# Patient Record
Sex: Female | Born: 1945 | Race: White | Hispanic: No | Smoking: Current some day smoker
Health system: Southern US, Community
[De-identification: ages and names within clinical notes are randomized; demographics above are authoritative.]

## PROBLEM LIST (undated history)

## (undated) DIAGNOSIS — F329 Major depressive disorder, single episode, unspecified: Secondary | ICD-10-CM

## (undated) DIAGNOSIS — Z8719 Personal history of other diseases of the digestive system: Secondary | ICD-10-CM

## (undated) DIAGNOSIS — Z87440 Personal history of urinary (tract) infections: Secondary | ICD-10-CM

## (undated) DIAGNOSIS — R42 Dizziness and giddiness: Secondary | ICD-10-CM

## (undated) DIAGNOSIS — R238 Other skin changes: Secondary | ICD-10-CM

## (undated) DIAGNOSIS — I639 Cerebral infarction, unspecified: Secondary | ICD-10-CM

## (undated) DIAGNOSIS — R233 Spontaneous ecchymoses: Secondary | ICD-10-CM

## (undated) DIAGNOSIS — T8859XA Other complications of anesthesia, initial encounter: Secondary | ICD-10-CM

## (undated) DIAGNOSIS — I499 Cardiac arrhythmia, unspecified: Secondary | ICD-10-CM

## (undated) DIAGNOSIS — I6529 Occlusion and stenosis of unspecified carotid artery: Secondary | ICD-10-CM

## (undated) DIAGNOSIS — I509 Heart failure, unspecified: Secondary | ICD-10-CM

## (undated) DIAGNOSIS — M1990 Unspecified osteoarthritis, unspecified site: Secondary | ICD-10-CM

## (undated) DIAGNOSIS — M21379 Foot drop, unspecified foot: Secondary | ICD-10-CM

## (undated) DIAGNOSIS — Z86718 Personal history of other venous thrombosis and embolism: Secondary | ICD-10-CM

## (undated) DIAGNOSIS — I739 Peripheral vascular disease, unspecified: Secondary | ICD-10-CM

## (undated) DIAGNOSIS — N289 Disorder of kidney and ureter, unspecified: Secondary | ICD-10-CM

## (undated) DIAGNOSIS — F32A Depression, unspecified: Secondary | ICD-10-CM

## (undated) DIAGNOSIS — I714 Abdominal aortic aneurysm, without rupture, unspecified: Secondary | ICD-10-CM

## (undated) DIAGNOSIS — H547 Unspecified visual loss: Secondary | ICD-10-CM

## (undated) DIAGNOSIS — J449 Chronic obstructive pulmonary disease, unspecified: Secondary | ICD-10-CM

## (undated) DIAGNOSIS — I2510 Atherosclerotic heart disease of native coronary artery without angina pectoris: Secondary | ICD-10-CM

## (undated) DIAGNOSIS — G458 Other transient cerebral ischemic attacks and related syndromes: Secondary | ICD-10-CM

## (undated) DIAGNOSIS — G8929 Other chronic pain: Secondary | ICD-10-CM

## (undated) DIAGNOSIS — K219 Gastro-esophageal reflux disease without esophagitis: Secondary | ICD-10-CM

## (undated) DIAGNOSIS — E785 Hyperlipidemia, unspecified: Secondary | ICD-10-CM

## (undated) DIAGNOSIS — T4145XA Adverse effect of unspecified anesthetic, initial encounter: Secondary | ICD-10-CM

## (undated) DIAGNOSIS — J189 Pneumonia, unspecified organism: Secondary | ICD-10-CM

## (undated) DIAGNOSIS — I10 Essential (primary) hypertension: Secondary | ICD-10-CM

## (undated) DIAGNOSIS — F419 Anxiety disorder, unspecified: Secondary | ICD-10-CM

## (undated) DIAGNOSIS — M549 Dorsalgia, unspecified: Secondary | ICD-10-CM

## (undated) DIAGNOSIS — Z5189 Encounter for other specified aftercare: Secondary | ICD-10-CM

## (undated) DIAGNOSIS — R0602 Shortness of breath: Secondary | ICD-10-CM

## (undated) DIAGNOSIS — C801 Malignant (primary) neoplasm, unspecified: Secondary | ICD-10-CM

## (undated) DIAGNOSIS — I679 Cerebrovascular disease, unspecified: Secondary | ICD-10-CM

## (undated) HISTORY — DX: Hyperlipidemia, unspecified: E78.5

## (undated) HISTORY — DX: Dizziness and giddiness: R42

## (undated) HISTORY — PX: HAND SURGERY: SHX662

## (undated) HISTORY — DX: Cerebrovascular disease, unspecified: I67.9

## (undated) HISTORY — PX: COSMETIC SURGERY: SHX468

## (undated) HISTORY — DX: Anxiety disorder, unspecified: F41.9

## (undated) HISTORY — DX: Abdominal aortic aneurysm, without rupture: I71.4

## (undated) HISTORY — PX: CARDIAC CATHETERIZATION: SHX172

## (undated) HISTORY — PX: FOOT SURGERY: SHX648

## (undated) HISTORY — DX: Unspecified osteoarthritis, unspecified site: M19.90

## (undated) HISTORY — DX: Abdominal aortic aneurysm, without rupture, unspecified: I71.40

## (undated) HISTORY — DX: Occlusion and stenosis of unspecified carotid artery: I65.29

## (undated) HISTORY — DX: Other transient cerebral ischemic attacks and related syndromes: G45.8

## (undated) HISTORY — PX: JOINT REPLACEMENT: SHX530

## (undated) HISTORY — DX: Essential (primary) hypertension: I10

## (undated) HISTORY — PX: OTHER SURGICAL HISTORY: SHX169

## (undated) HISTORY — DX: Major depressive disorder, single episode, unspecified: F32.9

## (undated) HISTORY — PX: ROTATOR CUFF REPAIR: SHX139

## (undated) HISTORY — PX: CARPAL TUNNEL RELEASE: SHX101

## (undated) HISTORY — PX: EYE SURGERY: SHX253

## (undated) HISTORY — PX: VAGINAL HYSTERECTOMY: SUR661

## (undated) HISTORY — PX: APPENDECTOMY: SHX54

## (undated) HISTORY — PX: NASAL SINUS SURGERY: SHX719

## (undated) HISTORY — DX: Depression, unspecified: F32.A

---

## 1998-04-10 ENCOUNTER — Emergency Department (HOSPITAL_COMMUNITY): Admission: EM | Admit: 1998-04-10 | Discharge: 1998-04-10 | Payer: Self-pay | Admitting: Internal Medicine

## 1998-05-04 ENCOUNTER — Emergency Department (HOSPITAL_COMMUNITY): Admission: EM | Admit: 1998-05-04 | Discharge: 1998-05-05 | Payer: Self-pay | Admitting: Emergency Medicine

## 1998-05-06 ENCOUNTER — Ambulatory Visit (HOSPITAL_COMMUNITY): Admission: RE | Admit: 1998-05-06 | Discharge: 1998-05-06 | Payer: Self-pay

## 1998-09-20 ENCOUNTER — Ambulatory Visit (HOSPITAL_COMMUNITY): Admission: RE | Admit: 1998-09-20 | Discharge: 1998-09-20 | Payer: Self-pay | Admitting: Cardiology

## 1999-04-07 ENCOUNTER — Encounter: Payer: Self-pay | Admitting: Orthopedic Surgery

## 1999-04-07 ENCOUNTER — Ambulatory Visit (HOSPITAL_COMMUNITY): Admission: RE | Admit: 1999-04-07 | Discharge: 1999-04-07 | Payer: Self-pay | Admitting: Orthopedic Surgery

## 1999-06-25 ENCOUNTER — Emergency Department (HOSPITAL_COMMUNITY): Admission: EM | Admit: 1999-06-25 | Discharge: 1999-06-25 | Payer: Self-pay | Admitting: Emergency Medicine

## 1999-06-27 ENCOUNTER — Emergency Department (HOSPITAL_COMMUNITY): Admission: EM | Admit: 1999-06-27 | Discharge: 1999-06-27 | Payer: Self-pay

## 1999-07-04 ENCOUNTER — Other Ambulatory Visit: Admission: RE | Admit: 1999-07-04 | Discharge: 1999-07-04 | Payer: Self-pay | Admitting: Orthopedic Surgery

## 1999-07-14 ENCOUNTER — Emergency Department (HOSPITAL_COMMUNITY): Admission: EM | Admit: 1999-07-14 | Discharge: 1999-07-14 | Payer: Self-pay | Admitting: Emergency Medicine

## 1999-07-18 ENCOUNTER — Inpatient Hospital Stay (HOSPITAL_COMMUNITY): Admission: AD | Admit: 1999-07-18 | Discharge: 1999-07-25 | Payer: Self-pay | Admitting: Orthopedic Surgery

## 2000-06-22 ENCOUNTER — Emergency Department (HOSPITAL_COMMUNITY): Admission: EM | Admit: 2000-06-22 | Discharge: 2000-06-22 | Payer: Self-pay | Admitting: Emergency Medicine

## 2000-12-03 ENCOUNTER — Emergency Department (HOSPITAL_COMMUNITY): Admission: EM | Admit: 2000-12-03 | Discharge: 2000-12-03 | Payer: Self-pay | Admitting: Emergency Medicine

## 2001-03-21 ENCOUNTER — Encounter: Payer: Self-pay | Admitting: Orthopedic Surgery

## 2001-03-25 ENCOUNTER — Inpatient Hospital Stay (HOSPITAL_COMMUNITY): Admission: RE | Admit: 2001-03-25 | Discharge: 2001-03-28 | Payer: Self-pay | Admitting: Orthopedic Surgery

## 2001-06-20 ENCOUNTER — Encounter: Payer: Self-pay | Admitting: Emergency Medicine

## 2001-06-20 ENCOUNTER — Emergency Department (HOSPITAL_COMMUNITY): Admission: EM | Admit: 2001-06-20 | Discharge: 2001-06-20 | Payer: Self-pay | Admitting: Emergency Medicine

## 2001-06-21 ENCOUNTER — Ambulatory Visit (HOSPITAL_COMMUNITY): Admission: RE | Admit: 2001-06-21 | Discharge: 2001-06-21 | Payer: Self-pay | Admitting: Orthopedic Surgery

## 2001-06-21 ENCOUNTER — Encounter: Payer: Self-pay | Admitting: Orthopedic Surgery

## 2001-08-02 ENCOUNTER — Emergency Department (HOSPITAL_COMMUNITY): Admission: EM | Admit: 2001-08-02 | Discharge: 2001-08-02 | Payer: Self-pay | Admitting: Emergency Medicine

## 2001-08-06 ENCOUNTER — Ambulatory Visit (HOSPITAL_COMMUNITY): Admission: RE | Admit: 2001-08-06 | Discharge: 2001-08-06 | Payer: Self-pay | Admitting: Family Medicine

## 2001-08-06 ENCOUNTER — Encounter: Payer: Self-pay | Admitting: Family Medicine

## 2002-04-08 ENCOUNTER — Encounter: Payer: Self-pay | Admitting: Emergency Medicine

## 2002-04-08 ENCOUNTER — Emergency Department (HOSPITAL_COMMUNITY): Admission: EM | Admit: 2002-04-08 | Discharge: 2002-04-09 | Payer: Self-pay | Admitting: Emergency Medicine

## 2002-04-09 ENCOUNTER — Ambulatory Visit (HOSPITAL_COMMUNITY): Admission: RE | Admit: 2002-04-09 | Discharge: 2002-04-09 | Payer: Self-pay | Admitting: Emergency Medicine

## 2003-04-13 ENCOUNTER — Ambulatory Visit (HOSPITAL_COMMUNITY): Admission: RE | Admit: 2003-04-13 | Discharge: 2003-04-13 | Payer: Self-pay | Admitting: Cardiology

## 2003-08-24 ENCOUNTER — Ambulatory Visit (HOSPITAL_BASED_OUTPATIENT_CLINIC_OR_DEPARTMENT_OTHER): Admission: RE | Admit: 2003-08-24 | Discharge: 2003-08-24 | Payer: Self-pay | Admitting: Orthopedic Surgery

## 2003-08-24 ENCOUNTER — Ambulatory Visit (HOSPITAL_COMMUNITY): Admission: RE | Admit: 2003-08-24 | Discharge: 2003-08-24 | Payer: Self-pay | Admitting: Orthopedic Surgery

## 2004-01-27 ENCOUNTER — Ambulatory Visit (HOSPITAL_COMMUNITY): Admission: RE | Admit: 2004-01-27 | Discharge: 2004-01-27 | Payer: Self-pay | Admitting: Orthopedic Surgery

## 2004-01-27 ENCOUNTER — Ambulatory Visit (HOSPITAL_BASED_OUTPATIENT_CLINIC_OR_DEPARTMENT_OTHER): Admission: RE | Admit: 2004-01-27 | Discharge: 2004-01-27 | Payer: Self-pay | Admitting: Orthopedic Surgery

## 2004-07-04 ENCOUNTER — Ambulatory Visit (HOSPITAL_COMMUNITY): Admission: RE | Admit: 2004-07-04 | Discharge: 2004-07-04 | Payer: Self-pay | Admitting: Family Medicine

## 2004-09-21 ENCOUNTER — Emergency Department (HOSPITAL_COMMUNITY): Admission: EM | Admit: 2004-09-21 | Discharge: 2004-09-22 | Payer: Self-pay | Admitting: Emergency Medicine

## 2004-09-23 ENCOUNTER — Inpatient Hospital Stay (HOSPITAL_COMMUNITY): Admission: EM | Admit: 2004-09-23 | Discharge: 2004-10-02 | Payer: Self-pay

## 2004-11-05 ENCOUNTER — Emergency Department (HOSPITAL_COMMUNITY): Admission: EM | Admit: 2004-11-05 | Discharge: 2004-11-05 | Payer: Self-pay | Admitting: Emergency Medicine

## 2007-05-02 ENCOUNTER — Ambulatory Visit (HOSPITAL_COMMUNITY): Admission: RE | Admit: 2007-05-02 | Discharge: 2007-05-02 | Payer: Self-pay | Admitting: Family Medicine

## 2011-02-16 NOTE — Consult Note (Signed)
NAMEREMEDY, CORPORAN NO.:  [id number]   MEDICAL RECORD NO.:  [id number]          PATIENT TYPE:  INP   LOCATION:  0470                         FACILITY:  [facility]   PHYSICIAN:  Angelia Mould. Derrell Lolling, M.D.DATE OF BIRTH:  [birth year]   DATE OF CONSULTATION:  09/24/2004  DATE OF DISCHARGE:                                   CONSULTATION   CHIEF COMPLAINT:  Pain, redness, and swelling of the perineum.   HISTORY OF PRESENT ILLNESS:  This is a 65 year old white female who reports  a 2-week history of progressive redness, pain, and swelling in the right  groin, right perineal area, and suprapubic area.  This has never happened to  her before.  She is not diabetic.  She was admitted to the [facility]  medical health team 48 hours ago and was noted to have severe cellulitis.  She was started on intravenous Cipro and clindamycin.  She has really not  gotten any better.  She states that she is having normal bowel movements and  not having any trouble having bowel movements.  She states that she is  voiding well but has noticed a little bit of blood in her urine.   A CT scan shows soft tissue swelling and cellulitis in the suprapubic soft  tissue area and the perineal area but no well-defined abscess has been seen.   PAST HISTORY:  1.  Multiple surgeries on lower extremities bilaterally for arthritis.  2.  She has had rotator cuff surgery.  3.  Stable abdominal aortic aneurysm followed by Dr. Aggie Cosier.  4.  Hypertension.  5.  Hyperlipidemia.   CURRENT MEDICATIONS:  1.  Paxil.  2.  Lovastatin.  3.  Hydrochlorothiazide.  4.  Talwin.  5.  Cipro.  6.  Clindamycin.   DRUG ALLERGIES:  PENICILLIN.   FAMILY HISTORY:  Mother had diabetes and coronary artery disease.  Father  had leukemia.  Both parents deceased.  Brother died of pancreatic cancer.  Sister died of a rare lung disease.   SOCIAL HISTORY:  The patient lives with her husband.  She smokes a half a  pack of  cigarettes per day.  She denies alcohol or drug use.   REVIEW OF SYSTEMS:  All systems are reviewed.  They are noncontributory  except as described above.   PHYSICAL EXAMINATION:  GENERAL:  Pleasant older white female who appears in  moderate distress.  VITAL SIGNS:  Temperature is 99.0, blood pressure 138/74, heart rate 79,  respiratory rate 20.  HEENT:  Eyes:  Sclerae clear, extraocular movements intact.  Ears, mouth and  throat, nose, lips, tongue and oropharynx without gross lesions.  NECK:  Supple, nontender, no mass, no jugular venous distention.  LUNGS:  Clear to auscultation.  No chest wall tenderness, no wheeze.  HEART:  Regular rate and rhythm.  No murmurs, rubs, or gallops.  ABDOMEN:  Soft, nontender, active bowel sounds, no mass, no hernia.  GENITOURINARY:  She has significant erythema and induration extending all  the way across the suprapubic area down both labia majora.  The right labia  majora  is much more indurated than the left.  There is no drainage or skin  necrosis but it is very tender.  In the right perineum anterior to the  rectum, there is foul-smelling draining sinus.  No real fluctuant area.  The  perianal skin actually looks all right.  The labia minora and the  periurethral area look fine.  EXTREMITIES:  She moves all four extremities well without pain or deformity.  NEUROLOGIC:  No gross motor or sensory deficits.   ADMISSION DATA:  A CT scan was performed 48 hours ago which shows cellulitis  of the anterior thigh and pelvis region, right greater than left.  Hemoglobin of 14.8; white blood cell count 25,000.   IMPRESSION:  1.  Severe soft tissue infection of the perineum including the right labia      majora and the suprapubic area.  I am concerned that this may be a      necrotizing synergistic infection.  2.  Hypertension.  3.  Tobacco abuse.   PLAN:  I advised the patient and she agreed that she should be taken to the  operating room urgently for  exploration of her wounds, proctoscopy, and  drainage and possible debridement.   I have discussed the indications and details of surgery with her.  Risks and  complications have been outlined, including but not limited to:  Bleeding;  infection; reoperation for uncontrolled infection; extensive tissue loss  requiring complex reconstruction and delayed healing; cardiac, pulmonary,  and thromboembolic problems.  She understands these issues well.  She agrees  with the plan.     Hayw   HMI/MEDQ  D:  09/24/2004  T:  09/25/2004  Job:  [id number]   cc:   Mallory Shirk, MD   Windle Guard, M.D.  [address]  [place], [place] [address]  Fax: [phone]

## 2011-02-16 NOTE — Op Note (Signed)
NAME:  Jocelyn Curtis, Jocelyn Curtis                         ACCOUNT NO.:  [id number]   MEDICAL RECORD NO.:  [id number]                   PATIENT TYPE:  AMB   LOCATION:  DSC                                  FACILITY:  [facility]   PHYSICIAN:  Rodney A. Chaney Malling, M.D.           DATE OF BIRTH:  1946-02-21   DATE OF PROCEDURE:  01/27/2004  DATE OF DISCHARGE:                                 OPERATIVE REPORT   PREOPERATIVE DIAGNOSES:  Massive retracted rotator cuff tear, left shoulder.  Osteoarthritis, left acromioclavicular (AC) joint.   POSTOPERATIVE DIAGNOSES:  Massive retracted rotator cuff tear, left  shoulder.  Osteoarthritis, left AC joint.   OPERATION:  Diagnostic arthroscopy, left shoulder; reconstruction of very  complex retracted rotator cuff tear, left shoulder; resection of left distal  clavicle.   SURGEON:  Lenard Galloway. Chaney Malling, M.D.   ANESTHESIA:  General.   PROCEDURE:  The patient was placed on the operative table in the supine  position.  After satisfactory oral endotracheal anesthesia, the patient was  placed in a semi-seated position.  The left shoulder and upper extremities  were prepped with Duraprep and draped out in the usual manner.  Through a  posterior portal, the arthroscope was introduced and very careful  examination was undertaken.  The articular cartilage of the humeral head and  the glenoid was absolutely normal as was the labrum.  There was a massive  rotator cuff tear.  There was partial tearing of the biceps tendon.  From  the intra-articular portion of the shoulder one could look up and see the  acromion.  Rotator cuff was completely and totally avulsed and retracted  proximally.  A saber cut incision was made over the anterolateral aspect of  the left shoulder.  The skin edges were retracted.  The deltoid fibers were  released off the anterior aspect of the acromion only.  Subacromial space  was opened.  A Neer anterior 1/3 acromioplasty was then done.  This gave  good access to the shoulder joint itself.  The underlying bursa was excised.  The patient had a massive rotator cuff tear which was retracted.  A great  deal of time was spent mobilizing a retracted tear and this could be brought  down to an almost anatomic position.  Using a series of 5-0 wire mattress  sutures, the rotator cuff was brought down almost to anatomic position and  closed side to side.  An almost completely water tight closure was achieved.  The repair was much better than I anticipated with extent of this tear.  This was fairly complicated with an extensive tear.  Throughout the  procedure, the shoulder was irrigated with copious amounts of antibiotic  solution.  Attention was then turned to the distal clavicle.  The capsule  was opened dorsally.  There were significant degenerative changes seen at  this level.  Power saw was then used to resect the end of the clavicle.  The  capsule was then closed using 2-0 fiber wire so that stability about distal  clavicle was achieved.  Again, the shoulder was irrigated with copious  amounts of antibiotic solution.  The deltoid fiber was reattached with 0  Vicryl.  Then 2-0 Vicryl was used to close the subcutaneous tissue and  stainless steel staples were used to close the skin.  Sterile dressings were  applied.  The patient was returned to the recovery room in excellent  condition.  Again, this was a fairly complicated, extensive procedure.                                               Rodney A. Chaney Malling, M.D.    RAM/MEDQ  D:  01/27/2004  T:  01/27/2004  Job:  [id number]

## 2011-02-16 NOTE — Discharge Summary (Signed)
[place]  Patient:    Jocelyn Curtis, Jocelyn Curtis Visit Number: [id number] MRN: [id number]          Service Type: EMS Location: [facility] Attending Physician:  Doug Sou Dictated by:   Jamelle Rushing, P.A. Admit Date:  06/20/2001 Discharge Date: 06/20/2001                             Discharge Summary  ADMISSION DIAGNOSES: 1. End-stage osteoarthritis bilateral knees, left greater than right. 2. Osteoarthritis bilateral shoulders and hands. 3. Anxiety disorder. 4. Hypercholesterolemia. 5. History of infected left knee.  DISCHARGE DIAGNOSES: 1. Left total knee arthroplasty. 2. Anxiety disorder. 3. Hypercholesterolemia. 4. Obesity. 5. Tobacco use. 6. Constipation.  HISTORY OF PRESENT ILLNESS:  The patient is a 65 year old female who presented to Dr. Chaney Malling with a history of left knee arthroscopy in July 2000 and a second one in October 2000 with an excision of popliteal cysts.  Her left knee became infected in October 2000.  She required repeated irrigation and debridement via arthroscopy to rid of infection.  The patient did very well since that time and has no return of symptoms of infection.  The pain in her knee has progressively worsened.  The pain is now described as a constant, grinding-type pain located along the joint line with radiation up into the hip. The pain increases with weightbearing and ambulation and decreases with rest.  The patient is currently taking Talwin to decrease her symptoms.  The knee does presently give way.  It does swell.  It does keep her up at night. She has no locking, catching, or popping.  She does use a cane for ambulation.   ALLERGIES: 1. Keflex. 2. Penicillin. 3. NSAIDs causing GI intolerance.  CURRENT MEDICATIONS: 1. Talwin NX two tablets p.o. q.4h. 2. Paxil 40 mg p.o. q.d. 3. Lipitor 20 mg p.o. q.d.  PROCEDURE:  On March 25, 2001, the patient was taken to the OR by Thereasa Distance A. Chaney Malling, M.D.  assisted by Arnoldo Morale, P.A.-C.  Under general anesthesia, the patient had a left total knee replacement performed using fully cemented components, a large size left femoral component, a size 4 cemented tibial keel tray, a three-peg cemented patella with a 10 mm large size poly insert.  The patient tolerated the procedure well.  One Hemovac drain was left in place. There were no complications.  The patient was transferred to the recovery room in excellent condition.   CONSULTATIONS:  The patient had the following routine consults:  Physical therapy, occupational therapy, rehab, and care management.  HOSPITAL COURSE:  On March 25, 2001, the patient was admitted to [facility] under the care of [facility] A. Chaney Malling, M.D.  The patient was taken to the OR where a left total knee arthroplasty was performed.  The patient tolerated this procedure well and was transferred to the recovery room and to the orthopedic floor for routine postoperative rehab.  The patient was started on _______ 2.5 mg subcutaneous for a total of seven days for DVT prophylaxis.  The patient then incurred a three-day postoperative course in which she did very well with physical therapy, and the only untoward even that she had was some constipation on postoperative day #2 and #3 which was relieved with an enema.  The patients vital signs remained stable.  Her chemistries and labs remained stable.  Her left leg wound remained benign for any signs of infection.  The  leg remained neural, motor, and vascularly intact.  The patient progressed to the point where she was discharged to home in good condition on postoperative day #3.  DIAGNOSTIC STUDIES:  EKG on admission was normal sinus rhythm at 74 beats per minute.  CBC on March 27, 2001, WBC 6.2, hemoglobin 9.9, hematocrit 28.4, platelets 161. Routine chemistries on March 27, 2001, sodium 136, potassium 4.0, glucose 149, BUN 9, creatinine 0.8.  Routine urinalysis on  admission was normal with the exception of moderate hemoglobin.  DISCHARGE MEDICATIONS:  1. Colace 100 mg p.o. b.i.d.  2. Senokot one tablet p.o. b.i.d.  3. Zocor 40 mg p.o. q.d.  4. Paxil 40 mg p.o. q.h.s.  5. _______ 2.5 mg subcutaneous q.d.  6. Reglan 10 mg p.o. q.6h. p.r.n.  7. LOC/EOC p.r.n.  8. Tylenol 650 mg p.o. q.4h. p.r.n.  9. Restoril 15 mg p.o. q.h.s. p.r.n. 10. OxyContin CR 10 mg p.o. q.12h. 11. Percocet 1-2 tablets q.4-6h. p.r.n. pain.  DISCHARGE INSTRUCTIONS:  The patient is to resume preoperative medications and diet.  OxyContin CR __ mg p.o. q.12h. _______ 2.5 mg subcutaneous q.d. times four more days.  DISCHARGE ACTIVITY:  Partial weightbearing 50% or less on left leg with the use of a walker.  SPECIAL INSTRUCTIONS:  Home health physical therapy with Genevieve Norlander.  WOUND CARE:  The patient is to keep wound clean and dry.  Check daily for any signs of infection.  FOLLOW-UP APPOINTMENT:  The patient is to call for a follow-up appointment with Dr. Chaney Malling on April 07, 2001.  CONDITION ON DISCHARGE:  Listed as good. Dictated by:   Jamelle Rushing, P.A. Attending Physician:  Doug Sou DD:  06/26/01 TD:  06/26/01 Job: [id number]

## 2011-02-16 NOTE — H&P (Signed)
Jocelyn, Curtis NO.:  [id number]   MEDICAL RECORD NO.:  [id number]          PATIENT TYPE:  INP   LOCATION:  0465                         FACILITY:  [facility]   PHYSICIAN:  Mallory Shirk, MD     DATE OF BIRTH:  1945-10-13   DATE OF ADMISSION:  09/22/2004  DATE OF DISCHARGE:                                HISTORY & PHYSICAL   PRIMARY CARE PHYSICIAN:  Jocelyn Curtis, M.D.   CHIEF COMPLAINT:  Pain, swelling, erythema in the suprapubic, perineal, and  the right labile area.   HISTORY OF PRESENT ILLNESS:  Jocelyn Curtis is a pleasant 65 year old  Caucasian woman who came to the emergency department yesterday with  complaint of pain, erythema in the suprapubic, perineal, and right labile  area.  She was seen and discharged with Clindamycin p.o. and Lortab for  pain; however, the patient's condition did not get better so she came back  to the ED about 18 hours after discharge.  The patient states that the  symptoms started about two weeks ago with no apparent precipitating event.  Swelling became progressively worse and the pain became unbearable.  The  patient denies any other symptoms other than a headache.  Since the swelling  of the labile area, the patient has had discomfort while urinating; however,  she did not have any dysuria prior to a day ago.   The patient used to see a primary care physician named Jocelyn Curtis.  She does not see him anymore.  She has also been seen by Jocelyn Curtis,  whom she does not see any more.  At the present time, the patient does not  have a primary care physician.   PAST MEDICAL HISTORY:  1.  Multiple surgeries on the lower extremities bilaterally for arthritis.  2.  Stable abdominal aortic aneurysm followed by Jocelyn Curtis. Jocelyn Curtis,      cardiology.  3.  Hyperlipidemia.   MEDICATIONS:  1.  Paxil.  2.  Lovastatin.  3.  HCTZ.  4.  Talwin.   ALLERGIES:  PENICILLIN.   FAMILY HISTORY:  Significant for diabetes and  coronary artery disease in  mother.  Leukemia in father, both deceased.  Brother died of pancreatic  cancer and sister died of a rare lung disease.   SOCIAL HISTORY:  The patient lives with the husband.  One-half pack per day  cigarette smoking.  No alcohol or drug use.   REVIEW OF SYMPTOMS:  The patient has had a headache.  Denies any changes in  vision.  No dysphagia, chest pain, shortness of breath, abdominal pain.  No  perineal pain as described in the HPI.  No extremity pain at the present  time.  No dizziness or syncope.   PHYSICAL EXAMINATION:  VITAL SIGNS:  Blood pressure 132/96, pulse 113,  respiration 18, temperature 97.4.  GENERAL:  A very pleasant, middle-aged Caucasian woman lying in bed in no  acute distress.  HEENT:  Normocephalic and atraumatic.  PERRL.  Sclerae anicteric.  Oropharynx not erythematous.  NECK:  Supple.  No LAD.  No JVD.  LUNGS:  Clear to auscultation bilaterally.  No wheezes, no rales.  CARDIOVASCULAR:  S1 and S2 tachycardic.  No murmurs, gallops, or rubs.  ABDOMEN:  Soft.  Positive bowel sounds.  No tenderness and no masses.  GENITOURINARY:  Erythema, warm in the suprapubic, perineal area, right  greater than left.  Right labia is swollen significantly more than left.  Swelling in the right upper thigh.  EXTREMITIES:  No clubbing, cyanosis, or edema.  No tenderness.  NEUROLOGICAL:  Cranial nerves II through XII grossly intact.  Sensory and  motor within normal limits.  Motion of the right lower extremity limited by  pain in the perineal area.  Otherwise neurologic exam nonfocal.   LABORATORY DATA:  CT of the abdomen showed cellulitis of the anterior thigh  and pelvis region, right greater than left.  Extension into the right upper  thigh shows a 3 cm by 1 cm right perineal structure, question of abscess  seen.   WBC is 19.5, hemoglobin is 14.8, hematocrit 42.5, platelets 177,000.  Sodium  139, potassium 3.0, chloride 101, carbon dioxide 29, glucose  130, BUN 16,  creatinine 1.4.  Calcium 9.4, total protein 6.3, albumin 3.8, AST 19, ALT  12, alkaline phosphate 83, total bilirubin 0.6.   ASSESSMENT:  A 65 year old Caucasian woman with cellulitis in the  suprapubic, perineal, and right labile areas x 2 weeks.   PLAN:  1.  Cellulitis:  The patient is started on Unasyn 3 gm IV q.6h.  IV fluids      at 100 mL per hour.  Pain management with IV morphine.  Blood cultures x      2 have been drawn.  2.  Hypokalemia:  The patient will be supplemented with p.o. potassium and      also IV fluids will be normal saline with 20 of potassium.  We will      continue to monitor her electrolytes.  3.  Leukocytosis:  The patient's white count is 19.5.  We will continue to      monitor her white count.  4.  Her home medications were resumed.  She is on Lovenox 40 mg      subcutaneously q.d. for deep vein thrombosis prophylaxis and Protonix 40      mg p.o. q.d. for GI prophylaxis.  5.  Disposition:  After resolution of acute symptoms, the patient will be      discharged with p.o. antibiotics as needed.       GDK/MEDQ  D:  09/23/2004  T:  09/23/2004  Job:  [id number]   cc:   Jocelyn Curtis, M.D.  [address]  [place] [address]  Fax: [phone]

## 2011-02-16 NOTE — H&P (Signed)
NAMEOTTO, CARAWAY               ACCOUNT NO.:  [id number]   MEDICAL RECORD NO.:  [id number]          PATIENT TYPE:  INP   LOCATION:  0465                         FACILITY:  [facility]   PHYSICIAN:  Mallory Shirk, MD     DATE OF BIRTH:  October 09, 1945   DATE OF ADMISSION:  09/22/2004  DATE OF DISCHARGE:                                HISTORY & PHYSICAL   ADDENDUM  Please note the patient's antibiotics have been changed from Unasyn to  cefazolin 1 g IV q.8h. and Flagyl 500 mg IV q.8h. since the patient is  allergic to PENICILLIN.      GDK/MEDQ  D:  09/23/2004  T:  09/23/2004  Job:  [id number]

## 2011-02-16 NOTE — H&P (Signed)
[place]. [facility]  Patient:    Jocelyn Curtis, Jocelyn Curtis                      MRN: [id number] Adm. Date:  [id number] Disc. Date: [id number] Attending:  Lorre Nick Dictator:   Arnoldo Morale, P.A.                         History and Physical  DATE OF BIRTH: [birth year]  CHIEF COMPLAINT: Left knee pain for the last five years.  HISTORY OF PRESENT ILLNESS: This 65 year old white female patient presented to Dr. Chaney Malling with a history of repeated left knee arthroscopy.  She had left knee arthroscopy in July 2000 and then subsequently had to undergo another one in October 2000, with open excision of a popliteal cyst.  Her left knee became infected and on July 17, 1999 and then again on July 21, 1999 she required repeat irrigation and debridement via arthroscopy to help get rid of the infection.  She did well and had no signs of infection since that time, but the pain in her left knee has gotten progressively worse.  The pain was initially gradual in onset, with no known injury.  The pain is now described as a constant grinding-type pain located along the joint line of the knee, with radiation all the way up to her hip at times.  The pain increases with any walking and decreases with rest.  She is currently taking Talwin to help decrease the pain and that provides a moderate amount of relief.  The knee does currently give way, swell, and does keep her up at night.  There is no locking, catching, or popping noted.  She has required the use of a cane for ambulation since October 2000.  ALLERGIES:  1. KEFLEX and PENICILLIN cause itching, tremors, and erythema.  2. NSAIDs cause GI intolerance.  CURRENT MEDICATIONS:  1. Talwin NX two tablets p.o. q.4h for pain.  2. Paxil 40 mg one tablet p.o. q.p.m.  3. Lipitor 20 mg one tablet p.o. q.d.  PAST MEDICAL HISTORY:  1. She was diagnosed with anxiety disorder approximately two to three years     ago.  2. 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  3. She does have hypercholesterolemia.  She denies any history of hypertension, diabetes mellitus, thyroid disease, coronary artery disease, peptic ulcer disease, asthma, bronchitis, reflux, or any other chronic medical condition other than noted previously.  PAST SURGICAL HISTORY:  1. Left knee arthroscopy in July 2000.  2. Left knee arthroscopy with excision of popliteal cyst in October 2000.  3. Left knee arthroscopy secondary to infection on July 17, 1999.  4. Left knee arthroscopy due to infection on July 21, 1999.  5. Vaginal hysterectomy 25 years ago.  6. Appendectomy at age 70.  7. Nasal surgery done 15-20 years ago.  8. Swanson prosthesis done in right index and long finger at the MCP joint     by Dr. Chaney Malling on August 23, 1998.  9. Decompression of left ulnar nerve at the elbow and median nerve at the     wrist on the left by Dr. Rinaldo Ratel on September 03, 2000. 10. Right open rotator cuff repair with distal clavicle resection and     acromioplasty by Dr. Rinaldo Ratel on October 08, 2000.  SOCIAL HISTORY: The patient has an 65  pack-year history of cigarette smoking and she does currently smoke about half a packs of cigarettes a day.  She does not use any alcohol or drugs.  She is on medical retirement.  She lives with her husband in a one-story house with three steps into the main entrance.  Her medical doctor is Dr. Windle Guard (phone # [phone]).  FAMILY HISTORY: Her mother died, with history of diabetes mellitus.  Her father died, with a history of leukemia.  She has one sister, who died at age 84 with myocardial infarction.  REVIEW OF SYSTEMS: She has a history of left neck claudication, which has been treated in the past by Dr. Elsie Lincoln.  She complains of frequent constipation. She has osteoarthritis in multiple joints and especially the metacarpophalangeal joints  bilaterally.  She has a history of a TIA approximately ten years ago.  She does wear glasses and partial dentures on the upper and lower jaw line.  She has a history of fever blisters and a raw tongue when she takes aspirin.  She has a history of irregular heart beat, possibly PVCs that did not require treatment.  She last got a cortisone injection in her left metacarpophalangeal joint at the index finger, I believe, on March 17, 2001.  She does have difficulty sleeping secondary to shoulder pain of both shoulders, but the right is worse than the left.  All other systems are negative and noncontributory at this time.  PHYSICAL EXAMINATION:  GENERAL: Well-developed, well-nourished white female, who walks with a limp on the left.  Mood and affect are appropriate.  She talks easily with the examiner.  VITAL SIGNS: Height 5 feet 9 inches.  Weight 235 pounds.  BMI 34.  TEMP 98.6 degrees Fahrenheit, pulse 88, respirations 18, BP 128/80.  HEENT: Normocephalic, atraumatic, without frontal or maxillary sinus tenderness to palpation.  Conjunctivae pink.  Sclerae anicteric.  PERRLA. EOMI.  No visible external ear deformities, with hearing grossly intact. Tympanic membranes are pearly gray bilaterally with good light reflex.  Nose and nasal septum midline.  Nasal mucosa pink and moist without exudate or polyp noted.  Buccal mucosa pink and moist.  Good dentition.  Pharynx without erythema or exudate.  Tongue and uvula midline.  Tongue without fasciculations and uvula rises equally with phonation.  NECK: No visible masses or lesions noted.  Trachea midline.  No palpable lymphadenopathy or thyromegaly.  Carotids +2 bilaterally without bruits.  CARDIOVASCULAR: Heart rate and rhythm regular.  S1 and S2 present without rubs, clicks, or murmurs noted.  RESPIRATORY: Respirations even and unlabored.  Breath sounds clear to auscultation bilaterally without rales or wheezes noted.  BREAST: She does  have a tattoo noted above her left breast that is a picture of a rose and the word, Peyton Najjar.  There are no other visible masses or lesions noted.   ABDOMEN: Rounded abdominal contour.  Bowel sounds present x 4 quadrants. Soft, nontender to palpation, without hepatosplenomegaly or CVA tenderness noted.  GU/RECTAL/PELVIC: These examinations are deferred at this time.  MUSCULOSKELETAL: There is a butterfly tattoo noted over the right first web space on the dorsum of her hand and a bee tattoo in the same place on the dorsum of her left hand.  There is a well-healed volar wrist incision on the left wrist and also an incision that is well-healed over the left medial elbow.  She is unable to fully extend her fingers on her right hand at the MCP joints.  Shoulder incision line is noted and  a sun tattoo is noted on the anterior left shoulder.  Otherwise, she has no other deformities or decreased range of motion noted of the upper extremities.  She has full range of motion of her hips, ankles, and toes without pain.  DP and PT pulses are +2.  The left knee has +2 effusion at this time.  It is lacking ten degrees of full extension and can flex only to 90 degrees.  She has pain with palpation on both the medial and lateral joint line and the knee appears to be in valgus deformity.  Her right knee has full extension and flexion to about 114 degrees.  There is a +1-2 effusion at this time.  She has minimal pain with palpation on both the medial and lateral joint line.  There appears to be also a mild valgus deformity at this time.  She has a Winnie-The-Pooh tattoo on the lateral aspect of her distal tibia.  NEUROLOGIC: Alert and oriented x 3.  Cranial nerves 2-12 grossly intact. Strength 5/5 in bilaterally upper and lower extremities.  Rapid alternating movement intact.  Deep tendon reflexes 2+ in bilateral upper and lower extremities.  RADIOLOGIC FINDINGS: X-rays taken of her left knee in June  2002 when compared to x-rays taken in July 2000 show definite definitive progressive collapse noted in the lateral compartment of the knee.  There are also marginal osteophytes noted about the lateral tibial plateau that were not present previously.  LABORATORY DATA: On March 05, 2001 WBC was 7.5, hemoglobin 14.6, hematocrit 42.1; platelets 207,000.  Sedimentation rate 5.  C-reactive protein less than 0.4.  IMPRESSION:  1. End-stage osteoarthritis of bilateral knees, left worse than right.  2. Osteoarthritis of bilateral shoulders and hands.  3. Anxiety disorder.  4. Hypercholesterolemia.  5. History of infected left knee.  PLAN: Ms. Royle will be admitted to [facility] on March 25, 2001, where she will undergo a left total knee arthroplasty by Dr. Rinaldo Ratel.  She will undergo all the routine preoperative laboratory tests and studies prior to this procedure. DD:  03/24/01 TD:  03/25/01 Job: [id number]

## 2011-02-16 NOTE — Op Note (Signed)
NAME:  Jocelyn Curtis, Jocelyn Curtis                         ACCOUNT NO.:  [id number]   MEDICAL RECORD NO.:  [id number]                   PATIENT TYPE:  AMB   LOCATION:  DSC                                  FACILITY:  [facility]   PHYSICIAN:  Rodney A. Chaney Malling, M.D.           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   OPERATION/PROCEDURE:  Diagnostic arthroscopy, resection undersurface right  distal clavicle; repeat repair of massive retracted rotator cuff tear, right  shoulder using two Mitek anchors.   SURGEON:  Lenard Galloway. Chaney Malling, M.D.   ANESTHESIA:  General.   PROCEDURE:  After satisfactory general anesthesia, the patient was placed on  the operating table in the semi supine position.  The right shoulder was  prepped with Duraprep and draped in the usual manner.  The arthroscope was  introduced in through the posterior portal.  Very careful examination of the  shoulder was undertaken.  The articular cartilage of the humeral head and  the glenoid appeared normal.  There was fraying of the anterior labrum and  there was a massive tear of the rotator cuff.  From the glenohumeral joint,  one could see the subacromial space very nicely.  The rotator cuff had  totally avulsed off the humeral head and retracted up to the margin of the  glenoid.  The entire head was uncovered.  The arthroscope was then removed.   The saber cut incision was made over the anterolateral aspect of the right  shoulder.  The skin edges were retracted.  Bleeders were coagulated.  The  deltoid fiber had partially been avulsed off the  anterior aspect of the  acromion and this was released and fully dissected free.  The subacromial  space was then entered.  Excellent visualization was achieved.  There was as  huge bone spur on the undersurface of the distal clavicle which had been  partially resected with a Mumford.  Using a power saw, the undersurface of  the clavicle was amputated and this gave much more clearance.  There was a  massive tear of the rotator cuff.  There were two large flaps appearing on  the anterior leaf and the posterior leaf.  The anterior leaf could be  mobilized distally in a fairly anatomic area and this was a fairly thick  piece of tissue.  The posterior leaf was thin, atrophic and stuck to the  undersurface of the acromion.  A great deal of time was spent peeling this  off from the undersurface of the acromion using blunt dissection.  The area  of  the catch about the humeral head was rongeured to make a clean foot  print.  Mitek anchor was inserted and the anterior leaf was brought down to  an almost anatomic position and then sutured in place.  An excellent repair  of this portion of the cuff was achieved.  A second Mitek was placed more  posteriorly on the head and the sutures passed through the posterior leaf  and advanced distally.  This was reinforced with sutures between the  posterior and anterior leaf.  It was sutured down in place and excellent  position of the posterior leaf was also achieved.  Throughout the procedure,  the shoulder was irrigated with copious amounts of antibiotic solution.  This was an extremely difficulty surgical procedure and took a great deal of  time and effort but the repair was much better than we could hope for.  Excellent  positioning of the cuff was achieved and excellent stability of the repair.  The deltoid fibers were then re-attached to the anterior aspect of the  acromion.  The subcutaneous tissues were closed with 2-0 Vicryl and the skin  was closed  with stainless steel staples.  Again, this was a very complex,  difficult repair of a repeat rotator cuff tear.                                               Rodney A. Chaney Malling, M.D.    RAM/MEDQ  D:  08/24/2003  T:  08/25/2003  Job:  [id number]

## 2011-02-16 NOTE — H&P (Signed)
Jocelyn Curtis, Jocelyn Curtis NO.:  [id number]   MEDICAL RECORD NO.:  [id number]          PATIENT TYPE:  INP   LOCATION:  0465                         FACILITY:  [facility]   PHYSICIAN:  Mallory Shirk, MD     DATE OF BIRTH:  02/03/1946   DATE OF ADMISSION:  09/22/2004  DATE OF DISCHARGE:                                HISTORY & PHYSICAL   PRIMARY CARE PHYSICIAN:  Hermelinda Medicus, M.D.   Dictation ended at this point.       GDK/MEDQ  D:  09/23/2004  T:  09/23/2004  Job:  [id number]

## 2011-02-16 NOTE — Op Note (Signed)
Jocelyn Curtis, Jocelyn Curtis NO.:  [id number]   MEDICAL RECORD NO.:  [id number]          PATIENT TYPE:  INP   LOCATION:  0470                         FACILITY:  [facility]   PHYSICIAN:  Angelia Mould. Derrell Lolling, M.D.DATE OF BIRTH:  August 04, 1946   DATE OF PROCEDURE:  09/24/2004  DATE OF DISCHARGE:                                 OPERATIVE REPORT   PREOPERATIVE DIAGNOSIS:  Severe soft tissue infection of the lower abdominal  wall and perineum.   POSTOPERATIVE DIAGNOSIS:  Necrotizing soft tissue infection of the lower  abdominal wall and perineum.   OPERATION PERFORMED:  Examination under anesthesia, extensive debridement of  skin, subcutaneous tissue, and muscle of the right perineum, right labia  majora, and lower abdominal wall.   SURGEON:  Angelia Mould. Derrell Lolling, M.D.   OPERATIVE INDICATIONS:  This is a 65 year old white female who states that  she has had progressive pain, erythema, and swelling of the suprapubic area  and the right labia majora and right perineum for about two weeks.  She was  admitted to the internal medicine service 48 hours ago with this problem and  was placed on broad-spectrum antibiotics.  She has not gotten any better.  I  was asked to see her this afternoon.  On exam, she had a very foul-smelling  draining sinus in the right perineum.  This was well away and anterior to  the right of the rectum.  The rectum did not appear to be involved.  The  labia majora and the suprapubic area, especially on the right side, were  markedly erythematous, indurated, and tender.  The erythema extended across  the left side, but the tissues were soft.  There is no crepitus and no skin  necrosis.   OPERATIVE TECHNIQUE:  Patient was brought to the operating room.  General  endotracheal anesthesia was induced.  She was placed in a modified dorsal  lithotomy position in stirrups.  I examined the rectum, and the rectal exam  showed that the tissues were perfectly soft.  Rectal  sphincter tone seemed  fairly normal.  There was no induration or mass effect anywhere around the  circumference of the rectum.  Limited pelvic exam showed that the vaginal  introitus and labia minora looked fine.  The urethral orifice looked fine.  We placed a Foley catheter under sterile prep without any difficulty.  There  was no vaginal bleeding that I could see.  I did not feel a mass in the wall  of the vagina.   We then prepped and draped the lower abdomen and the entire perineum and  buttock in a sterile fashion.  The draining sinus in the right perineum,  which was anterior to the right of the rectum, was inspected with the  hemostat, and the hemostat went straight anteriorly up along the labia  majora for a distance of about 8 inches.  I made an incision in a sagittal  plane up the right labia majora and laid this area open.  I found that the  skin and the deep subcutaneous tissues contained some purulent fluid, which  was  very foul-smelling.  This was cultured.  The infection extended up  superiorly into the suprapubic area but did not cross the midline.  The  inspection also extended up the inguinal canal laterally, requiring separate  incision.  This created a long, vertically oriented incision and a Y-shaped  incision up in the suprapubic area and inguinal areas.  We debrided some of  the skin, which appeared to be devascularized.  We extensively debrided the  blackened, grayish, purulent subcutaneous tissue.  Some of the muscle had to  be debrided as well.  We stayed well away from the vaginal orifice, and we  never came close to the rectum at all.  Bleeding was fairly extensive and  controlled nicely with electrocautery.  Once we felt we had exposed and  undermined all of the necrotic tissue and felt that we had good control, we  then used a pulse irrigator, and using about 2 liters of saline, we used  pulsatile lavage to irrigate out all of these tissues.  A few bleeders  were  controlled with electrocautery.   This was a very extensive dissection.  It took almost an hour to identify  and track down all of the infected areas.  This was a quite large wound at  the end as well.  It is my expectation that it will probably heal well by  secondary intention.   After irrigation and debridement to control the bleeding, I was satisfied  that we had everything under control.  We packed the wound with saline-  moistened Kerlix covered with ABD pads and fish-net panties.  We left the  Foley catheter in place.  There did not seem to be any bleeding.  The  patient tolerated the procedure well and was taken to the recovery room in  stable condition.  Estimated blood loss was 300-500 cc.  Complications were  none.  Sponge and instrument counts were correct.  No sutures were used.     Hayw   HMI/MEDQ  D:  09/24/2004  T:  09/25/2004  Job:  [id number]   cc:   Renato Battles, M.D.  Fax: [phone]   Windle Guard, M.D.  [address]  [place], [place] [address]  Fax: [phone]

## 2011-02-16 NOTE — Discharge Summary (Signed)
[name], Curtis NO.:  [id number]   MEDICAL RECORD NO.:  [id number]          PATIENT TYPE:  INP   LOCATION:  0470                         FACILITY:  [facility]   PHYSICIAN:  Jocelyn Shirk, MD     DATE OF BIRTH:  [birth year]   DATE OF ADMISSION:  09/22/2004  DATE OF DISCHARGE:  10/02/2004                                 DISCHARGE SUMMARY   DISCHARGE DIAGNOSES:  1.  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  2.  Hyperlipidemia.  3.  Depression.   DISCHARGE MEDICATIONS:  1.  Paxil 20 mg p.o. daily.  2.  Lovastatin 40 mg p.o. daily.  3.  Hydrochlorothiazide 25 mg p.o. daily.  4.  Talwin 50 mg two tablets p.o. q.6h. for pain.   FOLLOW UP APPOINTMENTS:  1.  Jocelyn Curtis and Dr. Jeannetta Curtis within a week for hospital follow-up.      Please review all medications with Dr. Jeannetta Curtis and Jocelyn Curtis.  2.  Home health to follow up with dressing changes at the patient's house.   HISTORY OF PRESENT ILLNESS:  Jocelyn Curtis is a very pleasant 65 year old  Caucasian female who came to the emergency department on September 22, 2004,  with complaints of pain and erythema in the suprapubic, perineal and right  labial areas.  She was seen and discharged with p.o. clindamycin and Lortab;  however, the patient's condition did not get better so she came back to the  ED about 18 hours after discharge.  The patient states that the initial  symptoms appeared two weeks prior to this ED visit with no apparent  precipitating event.  The swelling in the suprapubic and right labial area  became progressively worse.  The pain was unbearable.  The patient denies  any other symptoms other than headache.  Because of the apparent swelling in  the labial area, the patient had discomfort urinating, however, she did not  have any dysuria prior to a day before admission.   The patient has a primary care physician, Jocelyn Curtis, M.D.  She has also  been seen by Jocelyn Curtis, M.D.  At the  present time, the patient is not  actively followed by a primary care physician.   PAST MEDICAL HISTORY:  1.  Multiple surgeries to the lower extremities bilaterally for arthritis.  2.  Stable aortic aneurysm followed by Dr. Aggie Curtis, cardiology.  3.  Hyperlipidemia.   MEDICATIONS ON ADMISSION:  1.  Paxil.  2.  Lovastatin.  3.  Hydrochlorothiazide.  4.  Talwin.   ALLERGIES:  PENICILLIN.   PHYSICAL EXAMINATION ON ADMISSION:  GENERAL APPEARANCE:  A very pleasant  middle-aged woman lying in bed in no acute distress.  VITAL SIGNS:  Blood pressure 132/96, pulse 113, respiratory rate 18,  temperature 97.4.  HEENT:  Normocephalic and atraumatic.  PERRL.  Sclerae are anicteric.  Oropharynx __________.  NECK:  Supple.  No lymphadenopathy, no JVD.  LUNGS:  Clear to auscultation bilaterally.  No wheezes, no rales.  CARDIOVASCULAR:  S1 and S2, tachycardic.  No murmurs, rubs, or gallops.  ABDOMEN:  Soft,  positive bowel sounds, no tenderness, no masses.  GU:  Erythema, warm in the suprapubic perineal region, right greater than  left; right labia swollen significantly compared to the left.  Swelling also  noted in the right upper thigh with erythema.  EXTREMITIES:  No clubbing, cyanosis, or edema.  No tenderness.  NEUROLOGIC:  Cranial nerves II-XII grossly intact.  Sensory and motor within  normal limits.  Motion of the lower extremities limited by pain in the  perineal area, otherwise neurologic examination nonfocal.   LABORATORY DATA:  CT of the abdomen showed cellulitis of the entire thigh  and pelvis region, right greater than left.  Extension into the right thigh  shows a 3 x 1 cm right perineal structure, question of abscess.   The wbc is 19.5, hemoglobin 14.8, hematocrit 42.5, platelets 177.  Sodium  139, potassium 3.0., chloride 101, carbon dioxide 29, glucose 130, BUN 16,  creatinine 1.4, calcium 9.4, total protein 6.3, albumin 3.8, AST 19, ALT 12,  alkaline phosphatase 83,  total bilirubin 0.6.   HOSPITAL COURSE:  PROBLEM #1 -  CELLULITIS IN THE SUPRAPUBIC PERINEAL AREA:  The patient was admitted to the floor.  IV clindamycin was started.  On  September 24, 2004, a surgery consult was requested since the patient's  condition was not improving.  The patient was seen by Dr. Angelia Curtis. Jocelyn Curtis,  from surgery.  She was taken to the OR for debridement.  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  The patient tolerate this procedure well.  The wound  was dressed with wet-to-dry dressing.   Home health will be visiting the patient for dressing changes until the  patient can do so herself.  She was also started on Imipenem after the  surgery.  She was given Imipenem for a total of nine days.  On day of  discharge her wbc's were 9.4 and the patient was afebrile with vital signs  stable.   The patient's pain was managed by IV morphine p.r.n. and her Talwin was  restarted.  On the day of discharge, the patient's pain was well managed.  She needed very little of her IV morphine.  She will be discharged with  Lortab p.r.n.   PROBLEM #2 -  HYPERLIPIDEMIA:  The patient's Lovastatin was resumed.  Further follow-up and management is deferred to the primary care physician.   PROBLEM #3 -  DEPRESSION:  The patient's Paxil was started at the home dose  of 20 mg p.o. daily.  She will be discharged on this medication without  change in the dose.  Again, follow-up with primary care physician for  further management of the depression.   PROBLEM #4 -  HYPERTENSION:  The patient's blood pressure was episodically  elevated during the hospital stay, likely secondary to pain.  Her  hydrochlorothiazide was started at home dose of 25 mg p.o. daily.  On the day of discharge, the patient's blood pressure was 130/68 with pulse of 74  and a  temperature of 98.6.  She will continue her hydrochlorothiazide as she  was using it before this admission.   The patient will go home with her husband.  She has been advised to return  to the emergency department immediately upon onset of severe pain, discharge  from the surgical incision, dizziness, or any other symptoms that may need  immediate medical attention.  GDK/MEDQ  D:  10/02/2004  T:  10/02/2004  Job:  [id number]   cc:   Jocelyn Curtis, M.D.  [address]  [place], [place] [address]  Fax: [phone]   Jocelyn Curtis, M.D.  [address]  [place] [address]  Fax: [phone]

## 2011-02-16 NOTE — Op Note (Signed)
[place]  Patient:    Jocelyn Curtis, Jocelyn Curtis                      MRN: [id number] Proc. Date: 03/25/01 Adm. Date:  [id number] Attending:  Cornell Barman                           Operative Report  PREOPERATIVE DIAGNOSIS:  Severe osteoarthritis, left knee.  POSTOPERATIVE DIAGNOSIS:  Severe osteoarthritis, left knee.  PROCEDURE:  Total knee replacement on the left using fully-cemented components, with a large size left femoral component, size 4 cemented tibial keel tray, three-peg cemented patella with a 10 mm large size poly insert.  ANESTHESIA:  General.  SURGEON:  Rodney A. Chaney Malling, M.D.  ASSISTANT:  Arnoldo Morale, P.A.  DESCRIPTION OF PROCEDURE:  Patient placed on the operating table in supine position.  Pneumatic tourniquet about the left upper thigh.  Left lower extremity prepped with Duraprep and draped out in the usual manner.  Leg was wrapped out with an Esmarch.  The tourniquet was elevated.  An incision made starting about four inches above the patella and carried down to the tibial tubercle.  Skin edges were retracted.  Self-retaining retractors were inserted, and bleeders were coagulated.  A long medial parapatellar incision was made, and the patella was everted.  Rongeurs were used to take off osteophytes off both medial and lateral femoral condyles.  The knee was flexed 90 degrees.  Both medial and lateral meniscus were excised.  Z retractors were inserted.  Tibial guide #1 was placed over the anterior aspect of the tibia, and this was lined up with the cutting block to resect the proximal tibia. This was felt to be the appropriate level, and it was fixed with fixation pins.  The guide was removed, and the tower was applied to the cutting block. An excellent line was achieved.  The proximal tibia was then cut using the capture guide, and an excellent cut was obtained.  Once this was finished, both the medial and lateral  meniscus were excised.  Femoral guide #1 then placed over the anterior aspect of the femur and drill holes placed in the proximal femur.  Intramedullary rod was inserted.  The C clamp was inserted in the distal end of the femoral cut and placed on the flat surface of the tibia with the knee flexed.  This having proper rotation of the femur, and the femoral cutting guide was then fixed with fixation pins.  Using the capture guide, anterior and posterior condyles were removed from the distal femur. Femoral guide #2 was placed over the anterior aspect of the femur and held in place with intramedullary rod.  This was lined up to be flush with the cutting surface, and fixation pins were inserted.  With the knee in extension, a 10 mm block seemed to fit nicely, and when the knee was flexed 90 degrees, 10 mm block showed excellent balancing of the medial and lateral collateral ligaments.  The distal end of the femur was then completed using the capture guide.  At this point the knee was held in extension and a spacing block was inserted, and this fit very nicely with excellent balancing of the medial and lateral collateral ligaments.  This was checked again flexion, and there was good, symmetrical balancing with the 10 mm spacer block.  Femoral guide #3 was then placed over the distal end of the  femur, locked in place with fixation pins.  Chamfer cuts were made and drill holes were made.  Femoral block was then removed.  Excess bone removed.  Attention was then turned to the proximal tibia.  Using a McCale, the tibia was subluxed forward.  Several size tibial trays were placed on the proximal tibia, and the size 4 fit nicely with line-to-line fit.  This was fixed in place with fixation pins.  The tower was applied, and drill holes were placed in the proximal tibia.  The keel was inserted and driven home and fit very flush.  At this point the femoral trial component placed over the distal end of the  femur, and a 10 mm poly trial was inserted.  There was full flexion and full extension with excellent stability of the collateral ligaments, both in flexion and extension.  The patella was everted.  The patellar cutting guide was positioned, and the posterior aspect of the patella was amputated.  Using a three-peg guide over the posterior aspect of the patella, drill holes were placed in the patella.  The trial patella was inserted.  The knee was then put through a full range of motion. The patella tracked very nicely.  A small lateral release was done.  The knee was extremely stable in full flexion and full extension.  All the components then were removed.  Using pulsating lavage, all debris was removed.  The knee was irrigated with copious amounts of antibiotic solution.  The appropriate-size components had been selected, glue mixed, and glue was placed on the proximal end of the tibia, with the tibial tray inserted and impacted. Excess glue was then removed.  Glue was placed over the posterior end of the femoral component and the anterior aspect of the femur.  The femoral component was then impacted with a heavy hammer, and this seated very nicely.  Excess glue was removed.  The tibial trial poly was then inserted with the knee in full extension, and excess glue was removed.  Glue was placed in the posterior aspect of the patella, and the patella was inserted and held in place with a patellar clamp.  All excess glue was removed.  Once the glue had cured, the patellar clamp was removed.  Using a small osteotome, small pieces of excess glue were removed.  Again the knee was irrigated with copious amounts of saline solution.  The poly trial was then disarticulated from the knee and tourniquet dropped.  All bleeders were coagulated.  Good hemostasis was achieved.  There was good, bounding dorsal pedis pulse when this was accomplished.  The final 10 mm poly insert was then inserted, and the  knee was completely articulated.  Excellent range of motion at this point.  A Hemovac drain was inserted.  The parapatellar incision was closed with heavy Ethibond sutures.  _____ was used to close the subcutaneous tissue, and stainless  steel staples were used to close the skin.  Sterile dressings were applied and a knee immobilizer.  The patient returned to the recovery room in excellent condition.  Technically this went extremely well.  DRAINS:  Hemovac.  COMPLICATIONS:  None. DD:  03/25/01 TD:  03/26/01 Job: [id number]

## 2011-02-16 NOTE — Op Note (Signed)
Jocelyn Curtis, MILAN NO.:  [id number]   MEDICAL RECORD NO.:  [id number]          PATIENT TYPE:  INP   LOCATION:  0470                         FACILITY:  [facility]   PHYSICIAN:  Angelia Mould. 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   POSTOPERATIVE DIAGNOSIS:  Synergistic necrotizing soft tissue infection of  the perineum, right labia majora, and right inguinal area.   OPERATION PERFORMED:  Planned return to the operating room for examination  under anesthesia, rigid proctoscopy, debridement of subcutaneous tissue and  muscle, dressing change.   SURGEON:  Angelia Mould. Derrell Lolling, M.D.   OPERATIVE INDICATIONS:  This is a 65 year old white female who has a two-  week history of progressive redness, swelling, and pain in the right  perineum and inguinal area.  This has been progressive.  I was asked to see  her yesterday.  I felt that she had a necrotizing infection.  She was taken  to the operating room yesterday and underwent extensive debridement of the  perineum on the right side, extending the debridement up along the labia  majora and into the suprapubic area and inguinal area.  There was some  cellulitis on the left side.  This was minor.  There was no necrotizing  infection and abscess on the left side.  The rectum, vaginal wall, and  urethra were spared.  This did not appear to be a perirectal abscess.  The  patient is returned to the operating room as a planned event to inspect the  wound to make sure all of the necrotizing infection has been controlled.   OPERATIVE TECHNIQUE:  The patient was placed supine on the operating table.  General endotracheal anesthesia was induced.  She was placed in a lithotomy  position in rigid  stirrups.  Rigid proctoscopy was carried out to 16 cm.  I  saw no abnormality of the rectal mucosa whatsoever.  Digital rectal exam was  also performed.  There was no mass anywhere, and the tissues felt quite soft  up in the rectal wall.  There did not appear to be any pathology there.   We then prepped and draped the lower abdomen, perineum, and buttock in the  usual sterile fashion with Betadine.  This was done after all of the  dressings had been removed.  The wound was carefully  inspected in all of  its areas.  There was some necrotic skin and muscle, which was debrided, but  this seemed to be mostly devascularized and was really not a manifestation  of progressive infection.  All of the infection appeared to be well  controlled.  I used the pulsatile irrigator, irrigating about 1500 cc of  saline throughout the wound to continuously irrigate and debride the wound.  This worked well.  We had a few bleeders, which were controlled with  electrocautery.   There are a couple  of V-shaped wound edges, which were inspected.  They have  a little bit of a discoloration, but they had excellent capillary refill,  less than one second, and I felt that these would most likely survive and  did not need to be debrided.  After all the hemostasis was good, the  irrigation was done, we packed the wound with saline-  moistened Kerlix, ABD pads, and fishnet panties.  Patient tolerated the  procedure well and was taken to the recovery room in stable condition.  Estimated blood loss was probably about 20-30 cc.  Complications were none.  Sponge, needle, and instrument counts were correct.     Hayw   HMI/MEDQ  D:  09/25/2004  T:  09/25/2004  Job:  [id number]   cc:   [facility]   Windle Guard, M.D.  [address]  [place], [place] [address]  Fax: [phone]

## 2011-11-19 ENCOUNTER — Other Ambulatory Visit (HOSPITAL_COMMUNITY): Payer: Self-pay | Admitting: Neurology

## 2011-11-19 DIAGNOSIS — G459 Transient cerebral ischemic attack, unspecified: Secondary | ICD-10-CM

## 2011-11-21 ENCOUNTER — Other Ambulatory Visit (HOSPITAL_COMMUNITY): Payer: Medicare PPO

## 2011-11-22 ENCOUNTER — Other Ambulatory Visit: Payer: Self-pay

## 2011-11-22 ENCOUNTER — Ambulatory Visit (HOSPITAL_COMMUNITY): Payer: Medicare PPO | Attending: Cardiology

## 2011-11-22 DIAGNOSIS — G459 Transient cerebral ischemic attack, unspecified: Secondary | ICD-10-CM | POA: Insufficient documentation

## 2011-11-22 DIAGNOSIS — E119 Type 2 diabetes mellitus without complications: Secondary | ICD-10-CM | POA: Insufficient documentation

## 2011-11-22 DIAGNOSIS — I079 Rheumatic tricuspid valve disease, unspecified: Secondary | ICD-10-CM | POA: Insufficient documentation

## 2011-11-22 DIAGNOSIS — E785 Hyperlipidemia, unspecified: Secondary | ICD-10-CM | POA: Insufficient documentation

## 2011-11-22 DIAGNOSIS — I10 Essential (primary) hypertension: Secondary | ICD-10-CM | POA: Insufficient documentation

## 2011-11-23 ENCOUNTER — Encounter (HOSPITAL_COMMUNITY): Payer: Self-pay | Admitting: Neurology

## 2011-11-23 ENCOUNTER — Other Ambulatory Visit: Payer: Self-pay | Admitting: Neurology

## 2011-11-23 DIAGNOSIS — R42 Dizziness and giddiness: Secondary | ICD-10-CM

## 2011-11-23 DIAGNOSIS — I679 Cerebrovascular disease, unspecified: Secondary | ICD-10-CM

## 2011-11-30 ENCOUNTER — Ambulatory Visit
Admission: RE | Admit: 2011-11-30 | Discharge: 2011-11-30 | Disposition: A | Discharge status: Home / Self Care | Payer: Medicare PPO | Source: Ambulatory Visit | Attending: Neurology | Admitting: Neurology

## 2011-11-30 DIAGNOSIS — I679 Cerebrovascular disease, unspecified: Secondary | ICD-10-CM

## 2011-11-30 DIAGNOSIS — R42 Dizziness and giddiness: Secondary | ICD-10-CM

## 2011-12-11 ENCOUNTER — Other Ambulatory Visit (HOSPITAL_COMMUNITY): Payer: Self-pay | Admitting: Neurology

## 2011-12-11 DIAGNOSIS — I679 Cerebrovascular disease, unspecified: Secondary | ICD-10-CM

## 2011-12-11 DIAGNOSIS — R42 Dizziness and giddiness: Secondary | ICD-10-CM

## 2011-12-11 DIAGNOSIS — G458 Other transient cerebral ischemic attacks and related syndromes: Secondary | ICD-10-CM

## 2011-12-19 ENCOUNTER — Encounter: Payer: Self-pay | Admitting: Physician Assistant

## 2011-12-19 ENCOUNTER — Ambulatory Visit (HOSPITAL_COMMUNITY)
Admission: RE | Admit: 2011-12-19 | Discharge: 2011-12-19 | Disposition: A | Discharge status: Home / Self Care | Payer: Medicare PPO | Source: Ambulatory Visit | Attending: Neurology | Admitting: Neurology

## 2011-12-19 DIAGNOSIS — I679 Cerebrovascular disease, unspecified: Secondary | ICD-10-CM

## 2011-12-19 DIAGNOSIS — G458 Other transient cerebral ischemic attacks and related syndromes: Secondary | ICD-10-CM

## 2011-12-19 DIAGNOSIS — R42 Dizziness and giddiness: Secondary | ICD-10-CM

## 2011-12-24 ENCOUNTER — Other Ambulatory Visit (HOSPITAL_COMMUNITY): Payer: Self-pay | Admitting: Interventional Radiology

## 2011-12-24 ENCOUNTER — Encounter (HOSPITAL_COMMUNITY): Payer: Self-pay | Admitting: Pharmacy Technician

## 2011-12-24 DIAGNOSIS — G458 Other transient cerebral ischemic attacks and related syndromes: Secondary | ICD-10-CM

## 2011-12-25 ENCOUNTER — Other Ambulatory Visit: Payer: Self-pay | Admitting: Radiology

## 2011-12-26 ENCOUNTER — Other Ambulatory Visit: Payer: Self-pay | Admitting: Radiology

## 2011-12-27 ENCOUNTER — Other Ambulatory Visit: Payer: Self-pay

## 2011-12-27 ENCOUNTER — Encounter (HOSPITAL_COMMUNITY)
Admission: RE | Admit: 2011-12-27 | Discharge: 2011-12-27 | Disposition: A | Discharge status: Home / Self Care | Payer: Medicare PPO | Source: Ambulatory Visit | Attending: Interventional Radiology | Admitting: Interventional Radiology

## 2011-12-27 ENCOUNTER — Encounter (HOSPITAL_COMMUNITY): Payer: Self-pay

## 2011-12-27 ENCOUNTER — Encounter (HOSPITAL_COMMUNITY)
Admission: RE | Admit: 2011-12-27 | Discharge: 2011-12-27 | Disposition: A | Discharge status: Home / Self Care | Payer: Medicare PPO | Source: Ambulatory Visit | Attending: Anesthesiology | Admitting: Anesthesiology

## 2011-12-27 HISTORY — DX: Personal history of other venous thrombosis and embolism: Z86.718

## 2011-12-27 HISTORY — DX: Gastro-esophageal reflux disease without esophagitis: K21.9

## 2011-12-27 HISTORY — DX: Spontaneous ecchymoses: R23.3

## 2011-12-27 HISTORY — DX: Encounter for other specified aftercare: Z51.89

## 2011-12-27 HISTORY — DX: Depression, unspecified: F32.A

## 2011-12-27 HISTORY — DX: Unspecified osteoarthritis, unspecified site: M19.90

## 2011-12-27 HISTORY — DX: Disorder of kidney and ureter, unspecified: N28.9

## 2011-12-27 HISTORY — DX: Heart failure, unspecified: I50.9

## 2011-12-27 HISTORY — DX: Other skin changes: R23.8

## 2011-12-27 HISTORY — DX: Dorsalgia, unspecified: M54.9

## 2011-12-27 HISTORY — DX: Major depressive disorder, single episode, unspecified: F32.9

## 2011-12-27 HISTORY — DX: Peripheral vascular disease, unspecified: I73.9

## 2011-12-27 HISTORY — DX: Atherosclerotic heart disease of native coronary artery without angina pectoris: I25.10

## 2011-12-27 HISTORY — DX: Pneumonia, unspecified organism: J18.9

## 2011-12-27 HISTORY — DX: Chronic obstructive pulmonary disease, unspecified: J44.9

## 2011-12-27 HISTORY — DX: Foot drop, unspecified foot: M21.379

## 2011-12-27 HISTORY — DX: Personal history of other diseases of the digestive system: Z87.19

## 2011-12-27 HISTORY — DX: Other chronic pain: G89.29

## 2011-12-27 LAB — COMPREHENSIVE METABOLIC PANEL
Alkaline Phosphatase: 84 U/L (ref 39–117)
BUN: 22 mg/dL (ref 6–23)
Chloride: 102 mEq/L (ref 96–112)
GFR calc Af Amer: 58 mL/min — ABNORMAL LOW (ref >90)
GFR calc non Af Amer: 50 mL/min — ABNORMAL LOW (ref >90)
Glucose, Bld: 116 mg/dL — ABNORMAL HIGH (ref 70–99)
Potassium: 4.6 mEq/L (ref 3.5–5.1)
Total Bilirubin: 0.3 mg/dL (ref 0.3–1.2)

## 2011-12-27 LAB — PROTIME-INR: Prothrombin Time: 12.3 seconds (ref 11.6–15.2)

## 2011-12-27 LAB — CBC
MCH: 32.6 pg (ref 26.0–34.0)
MCHC: 34.1 g/dL (ref 30.0–36.0)
MCV: 95.7 fL (ref 78.0–100.0)
Platelets: 165 10*3/uL (ref 150–400)
RDW: 14.5 % (ref 11.5–15.5)

## 2011-12-27 NOTE — Pre-Procedure Instructions (Signed)
20 Jocelyn Curtis  12/27/2011   Your procedure is scheduled on:  Wed, April 3 @ 0830 AM  Report to [facility] at 0630 AM.  Call this number if you have problems the morning of surgery: [phone]   Remember:   Do not eat food:After Midnight.  May have clear liquids: up to 4 Hours before arrival.(until 2:30 am)  Clear liquids include soda, tea, black coffee, apple or grape juice, broth,water  Take these medicines the morning of surgery with A SIP OF WATER: Paxil   Do not wear jewelry, make-up or nail polish.  Do not wear lotions, powders, or perfumes.   Do not shave 48 hours prior to surgery.  Do not bring valuables to the hospital.  Contacts, dentures or bridgework may not be worn into surgery.  Leave suitcase in the car. After surgery it may be brought to your room.  For patients admitted to the hospital, checkout time is 11:00 AM the day of discharge.   Patients discharged the day of surgery will not be allowed to drive home.  Special Instructions: CHG Shower Use Special Wash: 1/2 bottle night before surgery and 1/2 bottle morning of surgery.   Please read over the following fact sheets that you were given: Pain Booklet, Coughing and Deep Breathing, MRSA Information and Surgical Site Infection Prevention

## 2011-12-27 NOTE — Progress Notes (Signed)
Dr.Grove was cardiologist and last visit was at >42yrs and only saw him on an as needed basis  Denies stress test/heart cath  Echo in epic from 11/2011  Medical MD Dr.Wilson Jeannetta Nap in [place]-manages HTN/hyperlipidemia

## 2011-12-31 ENCOUNTER — Other Ambulatory Visit (HOSPITAL_COMMUNITY): Payer: Self-pay | Admitting: Interventional Radiology

## 2011-12-31 DIAGNOSIS — I719 Aortic aneurysm of unspecified site, without rupture: Secondary | ICD-10-CM

## 2011-12-31 NOTE — Consult Note (Addendum)
Anesthesia: Patient is a 66 year old female scheduled for a left SCA arteriogram with angioplasty and stenting on 01/02/12 by Dr. Corliss Skains.  He has posted this to be done under [place].  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 Lesia Sago on 11/22/11).  She also reported history of "blot clot" following her right TKA, but this is not mentioned in the D/C Summary.  She also reports being told she has a AAA by Dr. Lucas Mallow, whom she hasn't seen in at least 8 years, and denies recent evaluation to monitor its size.  History also reports "CAD", but patient denies history of cath, stress, MI, CABG, or coronary stent.  Upon further questioning it sounds like she is including SCA stenosis as CAD.  She is s/p right TKA on 04/24/11.  EKG on 12/27/11 showed NSR.  She denied CP.  She had an episode of when her HR felt irregular for approximately 15 minutes around June 2012.  She has not had any significant cardiopulmonary symptoms since, and has tolerated a TKA. She does have chronic DOE with moderate activity.    Echo on 11/22/11 showed: Left ventricle: Wall thickness was increased in a pattern of mild LVH. There was focal basal hypertrophy. Systolic function was normal. The estimated ejection fraction was in the range of 60% to 65%. There was an increased relative contribution of atrial contraction to ventricular filling. Mild TR.  CXR on 12/27/11 showed Cardiomediastinal silhouette is stable. Four metallic anchors again noted in proximal right humerus. No acute infiltrate or pleural effusion. No pulmonary edema.  Labs noted.  Cr 1.12.  Glucose 116.  H/H 15.3/44.9.  I called and spoke with Mrs. Garcon.  She said that once Dr. Guadlupe Spanish retired, her records were suppose to go to Dr. Aleen Campi.  I called  his office, but they do not have any records readily available to send.  I reviewed her history with Anesthesiologist Dr. Ivin Booty who feels her AAA should be re-evaluated prior to an elective procedure.  I have spoken with Victorino Dike at Dr. Fatima Sanger office.  They will try to get her scheduled for an abdominal ultrasound tomorrow morning, 01/01/12. I'll follow-up results as available.  Brain MRI/MRA and carotid duplex requested from [facility] ([facility]).  FYI: Patient provided alternate phone numbers to get in touch with her via her daughter Misty Stanley.  They are [phone] (home) and [phone] (cell).  Addendum: 01/01/12 1200  Patient underwent an abdominal ultrasound to re-evaluate her AAA this morning.  Results showed: Atherosclerotic irregularity throughout the aorta. 3.6 cm distal abdominal aortic aneurysm.   This size should not warrant any intervention at this time.  Defer follow-up instructions to Dr. Corliss Skains.  I also reviewed her carotid duplex (11/21/11) and MRI/MRA head/brain (11/30/11) results from [facility].  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  (According to the results table, there was "N" to "I" diameter reduction of the bilateral ICAs which by their scale provided should translate into 0-20% or normal to mild stenosis.)  MRI/MRA of the head showed no significant stenosis of the medium-to-large size intracranial vessels.  Persistent bilateral fetal origins of both posterior cerebral arteries are benign variant. Mild changes of chronic microvascular ischemia.  Incidental 1.5X7 mm right frontal cystic scalp lesion.

## 2012-01-01 ENCOUNTER — Other Ambulatory Visit (HOSPITAL_COMMUNITY): Payer: Self-pay | Admitting: Interventional Radiology

## 2012-01-01 ENCOUNTER — Ambulatory Visit (HOSPITAL_COMMUNITY)
Admission: RE | Admit: 2012-01-01 | Discharge: 2012-01-01 | Disposition: A | Discharge status: Home / Self Care | Payer: Medicare PPO | Source: Ambulatory Visit | Attending: Interventional Radiology | Admitting: Interventional Radiology

## 2012-01-01 DIAGNOSIS — I719 Aortic aneurysm of unspecified site, without rupture: Secondary | ICD-10-CM

## 2012-01-02 ENCOUNTER — Encounter (HOSPITAL_COMMUNITY): Payer: Self-pay | Admitting: Vascular Surgery

## 2012-01-02 ENCOUNTER — Encounter (HOSPITAL_COMMUNITY): Payer: Self-pay

## 2012-01-02 ENCOUNTER — Ambulatory Visit (HOSPITAL_COMMUNITY): Payer: Medicare PPO | Admitting: Vascular Surgery

## 2012-01-02 ENCOUNTER — Ambulatory Visit (HOSPITAL_COMMUNITY)
Admission: RE | Admit: 2012-01-02 | Discharge: 2012-01-02 | Disposition: A | Discharge status: Home / Self Care | Payer: Medicare PPO | Source: Ambulatory Visit | Attending: Interventional Radiology | Admitting: Interventional Radiology

## 2012-01-02 ENCOUNTER — Encounter (HOSPITAL_COMMUNITY): Payer: Self-pay | Admitting: *Deleted

## 2012-01-02 ENCOUNTER — Encounter (HOSPITAL_COMMUNITY)
Admission: RE | Disposition: A | Discharge status: Home / Self Care | Payer: Self-pay | Source: Ambulatory Visit | Attending: Interventional Radiology

## 2012-01-02 VITALS — BP 110/55 | HR 79 | Temp 97.8°F | Resp 16

## 2012-01-02 DIAGNOSIS — G458 Other transient cerebral ischemic attacks and related syndromes: Secondary | ICD-10-CM

## 2012-01-02 DIAGNOSIS — R42 Dizziness and giddiness: Secondary | ICD-10-CM | POA: Insufficient documentation

## 2012-01-02 DIAGNOSIS — Z01818 Encounter for other preprocedural examination: Secondary | ICD-10-CM | POA: Insufficient documentation

## 2012-01-02 DIAGNOSIS — Z01812 Encounter for preprocedural laboratory examination: Secondary | ICD-10-CM | POA: Insufficient documentation

## 2012-01-02 DIAGNOSIS — Z01810 Encounter for preprocedural cardiovascular examination: Secondary | ICD-10-CM | POA: Insufficient documentation

## 2012-01-02 DIAGNOSIS — I771 Stricture of artery: Secondary | ICD-10-CM | POA: Insufficient documentation

## 2012-01-02 LAB — GLUCOSE, CAPILLARY
Glucose-Capillary: 115 mg/dL — ABNORMAL HIGH (ref 70–99)
Glucose-Capillary: 97 mg/dL (ref 70–99)

## 2012-01-02 SURGERY — RADIOLOGY WITH ANESTHESIA
Anesthesia: Monitor Anesthesia Care | Laterality: Bilateral

## 2012-01-02 MED ORDER — ASPIRIN EC 325 MG PO TBEC
DELAYED_RELEASE_TABLET | ORAL | Status: AC
  Filled 2012-01-02: qty 1

## 2012-01-02 MED ORDER — VANCOMYCIN HCL IN DEXTROSE 1-5 GM/200ML-% IV SOLN
1000.0000 mg | Freq: Once | INTRAVENOUS | Status: AC
  Administered 2012-01-02: 1000 mg via INTRAVENOUS

## 2012-01-02 MED ORDER — MUPIROCIN 2 % EX OINT
TOPICAL_OINTMENT | CUTANEOUS | Status: AC
  Administered 2012-01-02: 1 via NASAL
  Filled 2012-01-02: qty 22

## 2012-01-02 MED ORDER — VANCOMYCIN HCL IN DEXTROSE 1-5 GM/200ML-% IV SOLN
INTRAVENOUS | Status: AC
  Filled 2012-01-02: qty 200

## 2012-01-02 MED ORDER — HEPARIN SODIUM (PORCINE) 1000 UNIT/ML IJ SOLN
INTRAMUSCULAR | Status: DC | PRN
  Administered 2012-01-02: 2000 [IU] via INTRAVENOUS
  Administered 2012-01-02: 1000 [IU] via INTRAVENOUS
  Administered 2012-01-02: 500 [IU] via INTRAVENOUS

## 2012-01-02 MED ORDER — PHENYLEPHRINE HCL 10 MG/ML IJ SOLN
10.0000 mg | INTRAVENOUS | Status: DC | PRN
  Administered 2012-01-02: 10 ug/min via INTRAVENOUS

## 2012-01-02 MED ORDER — SODIUM CHLORIDE 0.9 % IV SOLN
INTRAVENOUS | Status: DC | PRN
  Administered 2012-01-02 (×2): via INTRAVENOUS

## 2012-01-02 MED ORDER — ASPIRIN EC 325 MG PO TBEC
325.0000 mg | DELAYED_RELEASE_TABLET | Freq: Once | ORAL | Status: AC
  Administered 2012-01-02: 325 mg via ORAL

## 2012-01-02 MED ORDER — SODIUM CHLORIDE 0.9 % IV SOLN: INTRAVENOUS | Status: AC

## 2012-01-02 MED ORDER — CLOPIDOGREL BISULFATE 75 MG PO TABS
ORAL_TABLET | ORAL | Status: AC
  Administered 2012-01-02: 75 mg via ORAL
  Filled 2012-01-02: qty 1

## 2012-01-02 MED ORDER — ONDANSETRON HCL 4 MG/2ML IJ SOLN: 4.0000 mg | Freq: Once | INTRAMUSCULAR | Status: DC | PRN

## 2012-01-02 MED ORDER — SODIUM CHLORIDE 0.9 % IV SOLN: Freq: Once | INTRAVENOUS | Status: DC

## 2012-01-02 MED ORDER — IOHEXOL 300 MG/ML  SOLN
150.0000 mL | Freq: Once | INTRAMUSCULAR | Status: AC | PRN
  Administered 2012-01-02: 120 mL via INTRA_ARTERIAL

## 2012-01-02 MED ORDER — HYDROMORPHONE HCL PF 1 MG/ML IJ SOLN: 0.2500 mg | INTRAMUSCULAR | Status: DC | PRN

## 2012-01-02 MED ORDER — NIMODIPINE 30 MG PO CAPS: 60.0000 mg | ORAL_CAPSULE | ORAL | Status: DC

## 2012-01-02 MED ORDER — ASPIRIN EC 325 MG PO TBEC: 325.0000 mg | DELAYED_RELEASE_TABLET | Freq: Once | ORAL | Status: DC

## 2012-01-02 MED ORDER — MUPIROCIN 2 % EX OINT: 1.0000 "application " | TOPICAL_OINTMENT | Freq: Two times a day (BID) | CUTANEOUS | Status: DC

## 2012-01-02 MED ORDER — SODIUM CHLORIDE 0.9 % IJ SOLN
1.5000 mg | INTRAVENOUS | Status: DC
  Filled 2012-01-02 (×2): qty 0.3

## 2012-01-02 MED ORDER — CLOPIDOGREL BISULFATE 75 MG PO TABS
75.0000 mg | ORAL_TABLET | Freq: Once | ORAL | Status: AC
  Administered 2012-01-02: 75 mg via ORAL

## 2012-01-02 MED ORDER — MIDAZOLAM HCL 5 MG/5ML IJ SOLN
INTRAMUSCULAR | Status: DC | PRN
  Administered 2012-01-02: 0.5 mg via INTRAVENOUS
  Administered 2012-01-02: 1 mg via INTRAVENOUS

## 2012-01-02 MED ORDER — NIMODIPINE 30 MG PO CAPS
ORAL_CAPSULE | ORAL | Status: AC
  Filled 2012-01-02: qty 2

## 2012-01-02 MED ORDER — FENTANYL CITRATE 0.05 MG/ML IJ SOLN
INTRAMUSCULAR | Status: DC | PRN
  Administered 2012-01-02 (×16): 25 ug via INTRAVENOUS

## 2012-01-02 NOTE — Anesthesia Postprocedure Evaluation (Signed)
  Anesthesia Post-op Note  Patient: Jocelyn Curtis  Procedure(s) Performed: Procedure(s) (LRB): RADIOLOGY WITH ANESTHESIA (Bilateral)  Patient Location: PACU  Anesthesia Type: MAC  Level of Consciousness: awake, oriented, sedated and patient cooperative  Airway and Oxygen Therapy: Patient Spontanous Breathing and Patient connected to nasal cannula oxygen  Post-op Pain: none  Post-op Assessment: Post-op Vital signs reviewed, Patient's Cardiovascular Status Stable, PATIENT'S CARDIOVASCULAR STATUS UNSTABLE, Patent Airway, No signs of Nausea or vomiting and Pain level controlled  Post-op Vital Signs: stable  Complications: No apparent anesthesia complications

## 2012-01-02 NOTE — H&P (Signed)
Jocelyn Curtis is an 66 y.o. female.   Chief Complaint: Subclavian steal syndrome with associated symptoms Dizziness; vision changes; left arm swelling; unsteady gait HPI: pt scheduled for cerebral and subclavian artery arteriogram with possible angioplasty/stent placement  Past Medical History  Diagnosis Date  . Hyperlipidemia     takes Zocor bid  . Anxiety and depression   . Osteoarthritis (arthritis due to wear and tear of joints)   . Abdominal aortic aneurysm without rupture     followed by Dr. Aggie Cosier  . Vision loss, bilateral   . Dizziness - giddy   . Cerebrovascular disease     pt states she pulls to the left and staggers alot  . Subclavian steal syndrome   . Hypertension     takes  Lisinopril daily  . Coronary artery disease   . Peripheral vascular disease   . H/O blood clots     in right foot-04/24/11  . CHF (congestive heart failure)   . COPD (chronic obstructive pulmonary disease)   . Pneumonia     hx of;last time about 84yrs ago  . Foot drop     right since knee replacement  . Arthritis   . Osteoarthritis   . Chronic back pain   . Bruises easily   . GERD (gastroesophageal reflux disease)     pt doesn't take any meds for this just monitors what she eat  . H/O: GI bleed   . Renal insufficiency   . Blood transfusion 52yrs ago  . Diabetes mellitus     but controlled with diet and exercise  . Depression     takes Paxil daily    Past Surgical History  Procedure Date  . Rotator cuff repair     bilateral  . Replacement total knee bilateral   . Appendectomy   . Vaginal hysterectomy   . Hand surgery   . Perineal abscess i & d     necrotizing soft tissue infection 2005  . Nasal sinus surgery   . Carpal tunnel release   . Foot surgery     left  . Cosmetic surgery     Family History  Problem Relation Age of Onset  . Anesthesia problems Neg Hx   . Hypotension Neg Hx   . Malignant hyperthermia Neg Hx   . Pseudochol deficiency Neg Hx    Social  History:  reports that she has been smoking.  She does not have any smokeless tobacco history on file. She reports that she drinks alcohol. She reports that she does not use illicit drugs.  Allergies:  Allergies  Allergen Reactions  . Codeine Nausea And Vomiting  . Nsaids Other (See Comments)    Gi bleed  . Penicillins Rash    Itching Abdominal swelling    Medications Prior to Admission  Medication Dose Route Frequency Provider Last Rate Last Dose  . 0.9 %  sodium chloride infusion   Intravenous Once Brayton El, PA      . aspirin EC 325 MG tablet           . aspirin EC tablet 325 mg  325 mg Oral Once Oneal Grout, MD   325 mg at 01/02/12 0741  . clopidogrel (PLAVIX) tablet 75 mg  75 mg Oral Once Oneal Grout, MD   75 mg at 01/02/12 0733  . mupirocin ointment (BACTROBAN) 2 % 1 application  1 application Nasal BID Oneal Grout, MD      . mupirocin ointment (BACTROBAN)  2 %        1 application at 01/02/12 0729  . niMODipine (NIMOTOP) 30 MG capsule           . niMODipine (NIMOTOP) capsule 60 mg  60 mg Oral 60 min Pre-Op Brayton El, PA      . vancomycin (VANCOCIN) 1 GM/200ML IVPB           . vancomycin (VANCOCIN) IVPB 1000 mg/200 mL premix  1,000 mg Intravenous Once Oneal Grout, MD      . DISCONTD: aspirin EC tablet 325 mg  325 mg Oral Once Robet Leu, PA       Medications Prior to Admission  Medication Sig Dispense Refill  . aspirin EC 325 MG tablet Take 325 mg by mouth daily.      . clopidogrel (PLAVIX) 75 MG tablet Take 75 mg by mouth daily.      Marland Kitchen lisinopril (PRINIVIL,ZESTRIL) 20 MG tablet Take 20 mg by mouth daily.      Marland Kitchen PARoxetine (PAXIL) 30 MG tablet Take 30 mg by mouth 2 (two) times daily.      . pentazocine-naloxone (TALWIN NX) 50-0.5 MG per tablet Take 2 tablets by mouth every 4 (four) hours.      . simvastatin (ZOCOR) 40 MG tablet Take 80 mg by mouth at bedtime.      . pantoprazole (PROTONIX) 40 MG tablet Take 40 mg by mouth daily.         Results for orders placed during the hospital encounter of 01/02/12 (from the past 48 hour(s))  GLUCOSE, CAPILLARY     Status: Abnormal   Collection Time   01/02/12  7:05 AM      Component Value Range Comment   Glucose-Capillary 115 (*) 70 - 99 (mg/dL)    US Aorta  10/06/1094  *RADIOLOGY REPORT*  Clinical Data:  Preop evaluation.  History of AAA.  ULTRASOUND OF ABDOMINAL AORTA  Technique:  Ultrasound examination of the abdominal aorta was performed to evaluate for abdominal aortic aneurysm.  Comparison: None.  Abdominal Aorta:  There is irregularity of the abdominal aorta with mild distal aneurysm.        Maximum AP diameter:  3.1 cm.       Maximum TRV diameter:  3.6 cm.  IMPRESSION: Atherosclerotic irregularity throughout the aorta.  3.6 cm distal abdominal aortic aneurysm.  Original Report Authenticated By: Cyndie Chime, M.D.    Review of Systems  Constitutional: Negative for fever.  Eyes: Positive for blurred vision.       Occasional blurry vision  Respiratory: Negative for cough.   Cardiovascular: Negative for chest pain.  Gastrointestinal: Negative for nausea and vomiting.  Musculoskeletal: Positive for back pain, joint pain and falls.       Arthritis  Last fall Nov 2012  Neurological: Positive for dizziness, tingling and speech change. Negative for headaches.       Dizziness and falls -last fall No 2012  Rt arm hand tingling now  Speech can be "muttered"- not new sx; since TIA several yrs ago    There were no vitals taken for this visit. Physical Exam  Constitutional: She is oriented to person, place, and time. She appears well-developed.  HENT:  Head: Normocephalic.  Eyes: EOM are normal.  Neck: Normal range of motion.  Cardiovascular: Normal rate, regular rhythm and normal heart sounds.   No murmur heard. Respiratory: Effort normal and breath sounds normal. She has no wheezes.  GI: Soft. Bowel sounds are normal. There  is no tenderness.  Musculoskeletal: Normal  range of motion.       Soreness all over; arthritis  Neurological: She is alert and oriented to person, place, and time.  Skin: Skin is warm.     Assessment/Plan Subclavian steal syndrome Pt scheduled for cerebral and subclavian artery arteriogram with possible angioplasty/stent placement. Pt and family aware of procedure benefits and risks and agreeable to proceed. Understands if intervention is performed she will spend night in Neuro ICU. Consent in chart   Eshaan Titzer A 01/02/2012, 7:48 AM

## 2012-01-02 NOTE — Discharge Instructions (Signed)

## 2012-01-02 NOTE — Procedures (Signed)
S/P 4 vessel cerebral artreiogram  RT CFA approach. Findings  1.Nearly completed Lt Subclavian artery with prominent Lt sub artery  Steal..

## 2012-01-02 NOTE — Anesthesia Preprocedure Evaluation (Addendum)
Anesthesia Evaluation  Patient identified by MRN, date of birth, ID band Patient awake    Reviewed: Allergy & Precautions, H&P , NPO status , Patient's Chart, lab work & pertinent test results  Airway Mallampati: I TM Distance: >3 FB Neck ROM: full    Dental  (+) Dental Advisory Given, Edentulous Lower and Edentulous Upper   Pulmonary COPD         Cardiovascular hypertension, Pt. on medications + CAD, + Peripheral Vascular Disease and +CHF Rhythm:regular Rate:Normal     Neuro/Psych PSYCHIATRIC DISORDERS Anxiety Depression    GI/Hepatic GERD-  ,  Endo/Other  Diabetes mellitus-, Type 2  Renal/GU Renal InsufficiencyRenal disease     Musculoskeletal  (+) Arthritis -, Osteoarthritis,    Abdominal   Peds  Hematology   Anesthesia Other Findings   Reproductive/Obstetrics                         Anesthesia Physical Anesthesia Plan  ASA: IV  Anesthesia Plan: General, MAC and General ETT   Post-op Pain Management:    Induction: Intravenous  Airway Management Planned: Oral ETT  Additional Equipment:   Intra-op Plan:   Post-operative Plan: Extubation in OR  Informed Consent: I have reviewed the patients History and Physical, chart, labs and discussed the procedure including the risks, benefits and alternatives for the proposed anesthesia with the patient or authorized representative who has indicated his/her understanding and acceptance.   Dental advisory given  Plan Discussed with: CRNA, Anesthesiologist and Surgeon  Anesthesia Plan Comments:        Anesthesia Quick Evaluation

## 2012-01-02 NOTE — Transfer of Care (Signed)
Immediate Anesthesia Transfer of Care Note  Patient: Jocelyn Curtis  Procedure(s) Performed: Procedure(s) (LRB): RADIOLOGY WITH ANESTHESIA (Bilateral)  Patient Location: PACU  Anesthesia Type: MAC  Level of Consciousness: awake, alert  and oriented  Airway & Oxygen Therapy: Patient Spontanous Breathing and Patient connected to nasal cannula oxygen  Post-op Assessment: Report given to PACU RN, Post -op Vital signs reviewed and stable and Patient moving all extremities X 4  Post vital signs: Reviewed and stable  Complications: No apparent anesthesia complications

## 2012-01-02 NOTE — Preoperative (Signed)
Beta Blockers   Reason not to administer Beta Blockers:Not Applicable 

## 2012-01-03 ENCOUNTER — Telehealth (HOSPITAL_COMMUNITY): Payer: Self-pay | Admitting: *Deleted

## 2012-01-03 NOTE — Telephone Encounter (Signed)
Post procedure follow up call. No answer, VM left 

## 2012-01-08 ENCOUNTER — Other Ambulatory Visit (HOSPITAL_COMMUNITY): Payer: Self-pay | Admitting: Interventional Radiology

## 2012-01-08 DIAGNOSIS — I771 Stricture of artery: Secondary | ICD-10-CM

## 2012-01-08 DIAGNOSIS — G458 Other transient cerebral ischemic attacks and related syndromes: Secondary | ICD-10-CM

## 2012-01-15 ENCOUNTER — Ambulatory Visit (HOSPITAL_COMMUNITY)
Admission: RE | Admit: 2012-01-15 | Discharge: 2012-01-15 | Disposition: A | Discharge status: Home / Self Care | Payer: Medicare PPO | Source: Ambulatory Visit | Attending: Interventional Radiology | Admitting: Interventional Radiology

## 2012-01-15 DIAGNOSIS — G458 Other transient cerebral ischemic attacks and related syndromes: Secondary | ICD-10-CM

## 2012-01-15 DIAGNOSIS — I771 Stricture of artery: Secondary | ICD-10-CM

## 2012-01-17 ENCOUNTER — Encounter (HOSPITAL_COMMUNITY): Payer: Self-pay | Admitting: Pharmacy Technician

## 2012-01-17 ENCOUNTER — Other Ambulatory Visit (HOSPITAL_COMMUNITY): Payer: Self-pay | Admitting: Interventional Radiology

## 2012-01-17 DIAGNOSIS — G458 Other transient cerebral ischemic attacks and related syndromes: Secondary | ICD-10-CM

## 2012-01-23 ENCOUNTER — Other Ambulatory Visit: Payer: Self-pay | Admitting: Physician Assistant

## 2012-01-24 ENCOUNTER — Other Ambulatory Visit: Payer: Self-pay | Admitting: Physician Assistant

## 2012-01-24 NOTE — Pre-Procedure Instructions (Signed)
20 Jocelyn Curtis  01/24/2012   Your procedure is scheduled on:  Monday January 28, 2012  Report to [facility] at 0630 AM.  Call this number if you have problems the morning of surgery: [phone]   Remember:   Do not eat food:After Midnight.  May have clear liquids: up to 4 Hours before arrival. (up to 2:30am)  Clear liquids include soda, tea, black coffee, apple or grape juice, broth.  Take these medicines the morning of surgery with A SIP OF WATER: Norco, protonix, paxil   Do not wear jewelry, make-up or nail polish.  Do not wear lotions, powders, or perfumes. You may wear deodorant.  Do not shave 48 hours prior to surgery.  Do not bring valuables to the hospital.  Contacts, dentures or bridgework may not be worn into surgery.  Leave suitcase in the car. After surgery it may be brought to your room.  For patients admitted to the hospital, checkout time is 11:00 AM the day of discharge.   Patients discharged the day of surgery will not be allowed to drive home.  Name and phone number of your driver: /w daughter   Special Instructions: CHG Shower Use Special Wash: 1/2 bottle night before surgery and 1/2 bottle morning of surgery.   Please read over the following fact sheets that you were given: Pain Booklet, Coughing and Deep Breathing, MRSA Information and Surgical Site Infection Prevention

## 2012-01-25 ENCOUNTER — Encounter (HOSPITAL_COMMUNITY): Payer: Self-pay

## 2012-01-25 ENCOUNTER — Encounter (HOSPITAL_COMMUNITY)
Admission: RE | Admit: 2012-01-25 | Discharge: 2012-01-25 | Disposition: A | Discharge status: Home / Self Care | Payer: Medicare PPO | Source: Ambulatory Visit | Attending: Interventional Radiology | Admitting: Interventional Radiology

## 2012-01-25 DIAGNOSIS — G458 Other transient cerebral ischemic attacks and related syndromes: Secondary | ICD-10-CM | POA: Insufficient documentation

## 2012-01-25 HISTORY — DX: Cerebral infarction, unspecified: I63.9

## 2012-01-25 HISTORY — DX: Shortness of breath: R06.02

## 2012-01-25 LAB — BASIC METABOLIC PANEL
CO2: 31 mEq/L (ref 19–32)
Calcium: 9.7 mg/dL (ref 8.4–10.5)
Chloride: 102 mEq/L (ref 96–112)
Potassium: 4.7 mEq/L (ref 3.5–5.1)
Sodium: 140 mEq/L (ref 135–145)

## 2012-01-25 LAB — APTT: aPTT: 28 seconds (ref 24–37)

## 2012-01-25 LAB — CBC
HCT: 40.5 % (ref 36.0–46.0)
MCV: 97.8 fL (ref 78.0–100.0)
Platelets: 166 10*3/uL (ref 150–400)
RBC: 4.14 MIL/uL (ref 3.87–5.11)
WBC: 8.3 10*3/uL (ref 4.0–10.5)

## 2012-01-25 LAB — PROTIME-INR: INR: 0.91 (ref 0.00–1.49)

## 2012-01-28 ENCOUNTER — Encounter (HOSPITAL_COMMUNITY): Payer: Self-pay | Admitting: Anesthesiology

## 2012-01-28 ENCOUNTER — Encounter (HOSPITAL_COMMUNITY): Admission: RE | Payer: Self-pay | Source: Ambulatory Visit

## 2012-01-28 ENCOUNTER — Ambulatory Visit (HOSPITAL_COMMUNITY): Admission: RE | Admit: 2012-01-28 | Payer: Medicare PPO | Source: Ambulatory Visit | Admitting: Interventional Radiology

## 2012-01-28 ENCOUNTER — Ambulatory Visit (HOSPITAL_COMMUNITY): Admission: RE | Admit: 2012-01-28 | Payer: Medicare PPO | Source: Ambulatory Visit

## 2012-01-28 SURGERY — RADIOLOGY WITH ANESTHESIA
Anesthesia: General | Laterality: Left

## 2012-01-28 NOTE — Anesthesia Preprocedure Evaluation (Deleted)
Anesthesia Evaluation Anesthesia Physical Anesthesia Plan  ASA: III  Anesthesia Plan: MAC and General   Post-op Pain Management:    Induction: Intravenous  Airway Management Planned: Mask and Oral ETT  Additional Equipment:   Intra-op Plan:   Post-operative Plan: Extubation in OR  Informed Consent: I have reviewed the patients History and Physical, chart, labs and discussed the procedure including the risks, benefits and alternatives for the proposed anesthesia with the patient or authorized representative who has indicated his/her understanding and acceptance.     Plan Discussed with: CRNA, Anesthesiologist and Surgeon  Anesthesia Plan Comments:         Anesthesia Quick Evaluation

## 2012-01-29 ENCOUNTER — Telehealth (HOSPITAL_COMMUNITY): Payer: Self-pay

## 2012-01-29 NOTE — Telephone Encounter (Signed)
Waiting to reschedule Jocelyn Curtis.  Her daughter is having surgery.  She needs to plan her procedure around that

## 2012-02-18 ENCOUNTER — Encounter (HOSPITAL_COMMUNITY): Payer: Self-pay | Admitting: Pharmacy Technician

## 2012-02-20 ENCOUNTER — Encounter (HOSPITAL_COMMUNITY): Payer: Self-pay

## 2012-02-20 ENCOUNTER — Inpatient Hospital Stay (HOSPITAL_COMMUNITY): Admission: RE | Admit: 2012-02-20 | Discharge: 2012-02-20 | Payer: Medicare PPO | Source: Ambulatory Visit

## 2012-02-20 HISTORY — DX: Cardiac arrhythmia, unspecified: I49.9

## 2012-02-20 HISTORY — DX: Malignant (primary) neoplasm, unspecified: C80.1

## 2012-02-20 HISTORY — DX: Other complications of anesthesia, initial encounter: T88.59XA

## 2012-02-20 HISTORY — DX: Adverse effect of unspecified anesthetic, initial encounter: T41.45XA

## 2012-02-20 NOTE — Progress Notes (Signed)
CONFIRMED LAB APPOINTMENT WITH PATIENT 02/21/12  230 PM.

## 2012-02-20 NOTE — Pre-Procedure Instructions (Signed)
20 Lizandra MONCIA ANNAS  02/20/2012   Your procedure is scheduled on: Wednesday 02/27/12    Report to [facility] at 630 AM.  Call this number if you have problems the morning of surgery: [phone]   Remember:   Do not eat food:After Midnight.  May have clear liquids: up to 4 Hours before arrival.(UNTIL 230AM)  Clear liquids include soda, tea, black coffee, apple or grape juice, broth.  Take these medicines the morning of surgery with A SIP OF WATER: PROTONIX,PAXIL, ASPIRIN, PLAVIX    Do not wear jewelry, make-up or nail polish.  Do not wear lotions, powders, or perfumes. You may wear deodorant.  Do not shave 48 hours prior to surgery. Men may shave face and neck.  Do not bring valuables to the hospital.  Contacts, dentures or bridgework may not be worn into surgery.  Leave suitcase in the car. After surgery it may be brought to your room.  For patients admitted to the hospital, checkout time is 11:00 AM the day of discharge.   Patients discharged the day of surgery will not be allowed to drive home.  Name and phone number of your driver:   Special Instructions: CHG Shower Use Special Wash: 1/2 bottle night before surgery and 1/2 bottle morning of surgery.   Please read over the following fact sheets that you were given: Pain Booklet and Surgical Site Infection Prevention

## 2012-02-20 NOTE — Pre-Procedure Instructions (Signed)
20 Jarielys RAMA SORCI  02/20/2012   Your procedure is scheduled on:  Wednesday Feb 27, 2012.  Report to [facility] at 0630 AM.  Call this number if you have problems the morning of surgery: [phone]   Remember:   Do not eat food:After Midnight.  May have clear liquids: up to 4 Hours before arrival until 0230 am.  Clear liquids include soda, tea, black coffee, apple or grape juice, broth.  Take these medicines the morning of surgery with A SIP OF WATER: Pantoprazole (Protonix), Paroxetine (Paxil) and Pentazocine-Naloxone (Talwin NX) if needed for pain.   Do not wear jewelry, make-up or nail polish.  Do not wear lotions, powders, or perfumes. You may wear deodorant.  Do not shave 48 hours prior to surgery.   Do not bring valuables to the hospital.  Contacts, dentures or bridgework may not be worn into surgery.  Leave suitcase in the car. After surgery it may be brought to your room.  For patients admitted to the hospital, checkout time is 11:00 AM the day of discharge.   Patients discharged the day of surgery will not be allowed to drive home.  Name and phone number of your driver:   Special Instructions: CHG Shower Use Special Wash: 1/2 bottle night before surgery and 1/2 bottle morning of surgery.   Please read over the following fact sheets that you were given: Pain Booklet, Coughing and Deep Breathing, MRSA Information and Surgical Site Infection Prevention

## 2012-02-21 ENCOUNTER — Encounter (HOSPITAL_COMMUNITY)
Admission: RE | Admit: 2012-02-21 | Discharge: 2012-02-21 | Disposition: A | Discharge status: Home / Self Care | Payer: Medicare PPO | Source: Ambulatory Visit | Attending: Interventional Radiology | Admitting: Interventional Radiology

## 2012-02-21 LAB — BASIC METABOLIC PANEL
Calcium: 9.6 mg/dL (ref 8.4–10.5)
GFR calc non Af Amer: 40 mL/min — ABNORMAL LOW (ref >90)
Glucose, Bld: 106 mg/dL — ABNORMAL HIGH (ref 70–99)
Sodium: 135 mEq/L (ref 135–145)

## 2012-02-21 LAB — CBC
MCH: 33 pg (ref 26.0–34.0)
MCHC: 33.7 g/dL (ref 30.0–36.0)
Platelets: 163 10*3/uL (ref 150–400)
RBC: 4.18 MIL/uL (ref 3.87–5.11)

## 2012-02-21 LAB — SURGICAL PCR SCREEN: MRSA, PCR: NEGATIVE

## 2012-02-21 NOTE — Consult Note (Signed)
Anesthesia Chart Review:  Patient is a 66 year old female scheduled for an angiogram with possible angioplasty and/or stenting of the left subclavian artery for subclavian steal syndrome.  General anesthesia is planned.  She is s/p 4 vessel cerebral arteriogram on 01/02/12.  I reviewed her chart prior to that procedure.    history includes smoking, COPD, HLD, OA, HTN, GERD, DM2 diet controlled, depression, anxiety, history of GIB, history of transfusion, foot drop following a right TKA under spinal , CRI, CHF, episode of transient visual loss with suspected TIA (saw Dr. Lesia Sago on 11/22/11). She also reported history of "blot clot" following her right TKA, but this is not mentioned in the D/C Summary. History also reports "CAD", but patient denies history of cath, stress, MI, CABG, or coronary stent. Upon further questioning it sounds like she is including SCA stenosis as CAD. She is s/p right TKA on 04/24/11.  She also reported a history of AAA which Anesthesiologist Dr. Ivin Booty requested it be re-evaluated prior to her last procedure.  It measured 3.6 cm on 01/01/12.  EKG on 12/27/11 showed NSR. She denied CP. She had an episode of when her HR felt irregular for approximately 15 minutes around June 2012. She has not had any significant cardiopulmonary symptoms since, and has tolerated a TKA. She does have chronic DOE with moderate activity.   Echo on 11/22/11 showed:  Left ventricle: Wall thickness was increased in a pattern of mild LVH. There was focal basal hypertrophy. Systolic function was normal. The estimated ejection fraction was in the range of 60% to 65%. There was an increased relative contribution of atrial contraction to ventricular filling. Mild TR.   CXR on 12/27/11 showed Cardiomediastinal silhouette is stable. Four metallic anchors again noted in proximal right humerus. No acute infiltrate or pleural effusion. No pulmonary edema.   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 0-20% bilateral ICA stenosis.  MRI/MRA of the head showed no significant stenosis of the medium-to-large size intracranial vessels. Persistent bilateral fetal origins of both posterior cerebral arteries are benign variant. Mild changes of chronic microvascular ischemia. Incidental 1.5X7 mm right frontal cystic scalp lesion.  Labs noted.  The PAT nurse told the patient to contact Dr. Fatima Sanger office regarding Plavix/ASA instructions.  I've asked the Short Stay staff to follow-up as well to confirm if any additional labs are needed on the day of her procedure as I don't see coags or p2y12 ordered (which he typically orders prior to this type of procedure).  Shonna Chock, PA-C

## 2012-02-23 ENCOUNTER — Other Ambulatory Visit: Payer: Self-pay | Admitting: Radiology

## 2012-02-25 ENCOUNTER — Ambulatory Visit (HOSPITAL_COMMUNITY): Payer: Medicare PPO

## 2012-02-26 ENCOUNTER — Telehealth (HOSPITAL_COMMUNITY): Payer: Self-pay

## 2012-02-26 ENCOUNTER — Other Ambulatory Visit: Payer: Self-pay | Admitting: Physician Assistant

## 2012-02-26 NOTE — Progress Notes (Signed)
Called Dr. Simonne Come office(#[phone]) was transferred to [facility] after speaking to 3 people re: the need to clarify lab orders on pt.  Detailed message left on machine for scheduler, Victorino Dike.  Will await to hear from office.//L. Bralen Wiltgen,RN

## 2012-02-26 NOTE — Telephone Encounter (Signed)
Called Ms. Mandeville to let her know to make sure she stays overly hydrated.  Her phone has been disconnected.  I then called her daughter Misty Stanley.  She was going to get in touch with her mom.

## 2012-02-27 ENCOUNTER — Ambulatory Visit (HOSPITAL_COMMUNITY): Admission: RE | Admit: 2012-02-27 | Payer: Medicare PPO | Source: Ambulatory Visit

## 2012-02-28 ENCOUNTER — Encounter (HOSPITAL_COMMUNITY): Payer: Self-pay | Admitting: Vascular Surgery

## 2012-02-28 ENCOUNTER — Ambulatory Visit (HOSPITAL_COMMUNITY)
Admission: RE | Admit: 2012-02-28 | Discharge: 2012-02-28 | Disposition: A | Discharge status: Home / Self Care | Payer: Medicare PPO | Source: Ambulatory Visit | Attending: Interventional Radiology | Admitting: Interventional Radiology

## 2012-02-28 ENCOUNTER — Ambulatory Visit (HOSPITAL_COMMUNITY): Payer: Medicare PPO | Admitting: Vascular Surgery

## 2012-02-28 ENCOUNTER — Encounter (HOSPITAL_COMMUNITY)
Admission: RE | Disposition: A | Discharge status: Home / Self Care | Payer: Self-pay | Source: Ambulatory Visit | Attending: Interventional Radiology

## 2012-02-28 ENCOUNTER — Other Ambulatory Visit: Payer: Self-pay | Admitting: Radiology

## 2012-02-28 ENCOUNTER — Encounter (HOSPITAL_COMMUNITY): Payer: Self-pay | Admitting: Radiology

## 2012-02-28 DIAGNOSIS — G458 Other transient cerebral ischemic attacks and related syndromes: Secondary | ICD-10-CM | POA: Insufficient documentation

## 2012-02-28 LAB — BASIC METABOLIC PANEL
BUN: 45 mg/dL — ABNORMAL HIGH (ref 6–23)
Calcium: 9.6 mg/dL (ref 8.4–10.5)
Creatinine, Ser: 2.15 mg/dL — ABNORMAL HIGH (ref 0.50–1.10)
GFR calc non Af Amer: 23 mL/min — ABNORMAL LOW (ref >90)
Glucose, Bld: 113 mg/dL — ABNORMAL HIGH (ref 70–99)
Sodium: 136 mEq/L (ref 135–145)

## 2012-02-28 SURGERY — RADIOLOGY WITH ANESTHESIA
Anesthesia: Monitor Anesthesia Care

## 2012-02-28 MED ORDER — FENTANYL CITRATE 0.05 MG/ML IJ SOLN
INTRAMUSCULAR | Status: DC | PRN
  Administered 2012-02-28 (×10): 25 ug via INTRAVENOUS

## 2012-02-28 MED ORDER — FENTANYL CITRATE 0.05 MG/ML IJ SOLN
25.0000 ug | INTRAMUSCULAR | Status: DC | PRN
  Administered 2012-02-28 (×2): 50 ug via INTRAVENOUS

## 2012-02-28 MED ORDER — ASPIRIN EC 325 MG PO TBEC
DELAYED_RELEASE_TABLET | ORAL | Status: AC
  Filled 2012-02-28: qty 1

## 2012-02-28 MED ORDER — ASPIRIN 325 MG PO TABS
ORAL_TABLET | ORAL | Status: DC | PRN
  Administered 2012-02-28: 325 mg via ORAL

## 2012-02-28 MED ORDER — HEPARIN SODIUM (PORCINE) 1000 UNIT/ML IJ SOLN
INTRAMUSCULAR | Status: DC | PRN
  Administered 2012-02-28: 500 [IU] via INTRAVENOUS

## 2012-02-28 MED ORDER — MIDAZOLAM HCL 5 MG/5ML IJ SOLN
INTRAMUSCULAR | Status: DC | PRN
  Administered 2012-02-28 (×2): 0.5 mg via INTRAVENOUS

## 2012-02-28 MED ORDER — FENTANYL CITRATE 0.05 MG/ML IJ SOLN
INTRAMUSCULAR | Status: AC
  Filled 2012-02-28: qty 2

## 2012-02-28 MED ORDER — PENTAZOCINE-NALOXONE HCL 50-0.5 MG PO TABS
1.0000 | ORAL_TABLET | Freq: Four times a day (QID) | ORAL | Status: DC | PRN
  Administered 2012-02-28: 1 via ORAL
  Filled 2012-02-28: qty 1

## 2012-02-28 MED ORDER — ONDANSETRON HCL 4 MG/2ML IJ SOLN
INTRAMUSCULAR | Status: DC | PRN
  Administered 2012-02-28: 4 mg via INTRAVENOUS

## 2012-02-28 MED ORDER — IOHEXOL 300 MG/ML  SOLN
150.0000 mL | Freq: Once | INTRAMUSCULAR | Status: AC | PRN
  Administered 2012-02-28: 30 mL via INTRAVENOUS

## 2012-02-28 MED ORDER — ONDANSETRON HCL 4 MG/2ML IJ SOLN: 4.0000 mg | Freq: Once | INTRAMUSCULAR | Status: DC | PRN

## 2012-02-28 MED ORDER — CLOPIDOGREL BISULFATE 75 MG PO TABS
ORAL_TABLET | ORAL | Status: AC
  Filled 2012-02-28: qty 1

## 2012-02-28 MED ORDER — ASPIRIN EC 325 MG PO TBEC: 325.0000 mg | DELAYED_RELEASE_TABLET | ORAL | Status: DC

## 2012-02-28 MED ORDER — VANCOMYCIN HCL 1000 MG IV SOLR: 1000.0000 mg | Freq: Once | INTRAVENOUS | Status: DC

## 2012-02-28 MED ORDER — PROPOFOL 10 MG/ML IV EMUL
INTRAVENOUS | Status: DC | PRN
  Administered 2012-02-28: 10 mg via INTRAVENOUS

## 2012-02-28 MED ORDER — SODIUM CHLORIDE 0.9 % IV SOLN: INTRAVENOUS | Status: DC

## 2012-02-28 MED ORDER — HYDROMORPHONE HCL PF 1 MG/ML IJ SOLN: 0.2500 mg | INTRAMUSCULAR | Status: DC | PRN

## 2012-02-28 MED ORDER — VANCOMYCIN HCL IN DEXTROSE 1-5 GM/200ML-% IV SOLN
1000.0000 mg | Freq: Once | INTRAVENOUS | Status: AC
  Administered 2012-02-28: 1000 mg via INTRAVENOUS
  Filled 2012-02-28: qty 200

## 2012-02-28 MED ORDER — SODIUM CHLORIDE 0.9 % IV SOLN
INTRAVENOUS | Status: DC | PRN
  Administered 2012-02-28: 09:00:00 via INTRAVENOUS

## 2012-02-28 MED ORDER — CLOPIDOGREL BISULFATE 75 MG PO TABS
ORAL_TABLET | ORAL | Status: DC | PRN
  Administered 2012-02-28: 75 mg via ORAL

## 2012-02-28 MED ORDER — CLOPIDOGREL BISULFATE 75 MG PO TABS: 75.0000 mg | ORAL_TABLET | ORAL | Status: DC

## 2012-02-28 NOTE — Progress Notes (Signed)
Call to Radiology dept. Several times for contact with PA for clarification of orders.

## 2012-02-28 NOTE — Anesthesia Postprocedure Evaluation (Signed)
  Anesthesia Post-op Note  Patient: Jocelyn Curtis  Procedure(s) Performed: Procedure(s) (LRB): RADIOLOGY WITH ANESTHESIA (N/A)  Patient Location: PACU  Anesthesia Type: General  Level of Consciousness: awake  Airway and Oxygen Therapy: Patient Spontanous Breathing  Post-op Pain: mild  Post-op Assessment: Post-op Vital signs reviewed  Post-op Vital Signs: Reviewed  Complications: No apparent anesthesia complications

## 2012-02-28 NOTE — Discharge Instructions (Signed)
Angiogram, Angioplasty, or Stent Placement Care After One of the following procedures was done today. ANGIOGRAM: A catheter was placed through the blood vessel in your brachial artery, contrast was injected into the vessels, and pictures were taken.    Only take over-the-counter or prescription medicines for pain, discomfort, or fever as directed by your caregiver.   Complications are very uncommon after this procedure. Go to the nearest emergency department if you develop any of the following symptoms:   Worsening pain.   Bleeding.   Swelling at the puncture site.   Lightheadedness.   Dizziness or fainting.   Fever or chills.   If oozing, bleeding, or a lump appears at the puncture site, apply firm pressure directly to the site steadily for 15 minutes and go to the emergency department.   Keep the skin around the insertion site dry. You may take showers after 24 hours. If the area does get wet, dry the skin completely. Avoid baths until the skin puncture site heals, usually 5 to 7 days.   Development of redness, increased soreness, or swelling may be signs of a skin infection. Contact your physician.   Rest for the remainder of the day and avoid any heavy lifting (more than 10 pounds or 4.5 kg). Do not operate heavy machinery, drive, or make legal decisions for the first 24 hours after the procedure. Have a responsible person drive you home.   You may resume your usual diet after the procedure. Avoid alcoholic beverages for 24 hours after the procedure.  Document Released: 09/17/2005 Document Revised: 09/06/2011 Document Reviewed: 07/17/2006 [facility] Patient Information 2012 [place], [place].

## 2012-02-28 NOTE — Anesthesia Preprocedure Evaluation (Addendum)
Anesthesia Evaluation  Patient identified by MRN, date of birth, ID band Patient awake    Airway Mallampati: I      Dental  (+) Dental Advisory Given   Pulmonary shortness of breath, pneumonia , COPD breath sounds clear to auscultation        Cardiovascular hypertension, Pt. on medications + CAD and +CHF + dysrhythmias Rhythm:Regular Rate:Normal     Neuro/Psych    GI/Hepatic GERD-  ,  Endo/Other  Diabetes mellitus-  Renal/GU      Musculoskeletal   Abdominal   Peds  Hematology negative hematology ROS (+)   Anesthesia Other Findings   Reproductive/Obstetrics                        Anesthesia Physical Anesthesia Plan  ASA: III  Anesthesia Plan: MAC   Post-op Pain Management:    Induction: Intravenous  Airway Management Planned: Simple Face Mask and Oral ETT  Additional Equipment:   Intra-op Plan:   Post-operative Plan:   Informed Consent: I have reviewed the patients History and Physical, chart, labs and discussed the procedure including the risks, benefits and alternatives for the proposed anesthesia with the patient or authorized representative who has indicated his/her understanding and acceptance.   Dental advisory given  Plan Discussed with: CRNA, Anesthesiologist and Surgeon  Anesthesia Plan Comments:        Anesthesia Quick Evaluation

## 2012-02-28 NOTE — Preoperative (Signed)
Beta Blockers   Reason not to administer Beta Blockers:Not Applicable 

## 2012-02-28 NOTE — H&P (Signed)
Jocelyn Curtis is an 66 y.o. female.  Chief Complaint: Subclavian steal syndrome with associated symptoms   HPI: pt scheduled for cerebral and subclavian artery arteriogram with possible angioplasty/stent placement but was unsuccessful on 4/3. She came back to see Dr. Corliss Skains and is now schedule for another attempt via brachial approach. She reports no major changes in health history since we last saw her. No recent illnesses, fevers, diarrhea, UTIs.  Past Medical History   Diagnosis  Date   .  Hyperlipidemia      takes Zocor bid   .  Anxiety and depression    .  Osteoarthritis (arthritis due to wear and tear of joints)    .  Abdominal aortic aneurysm without rupture      followed by Dr. Aggie Cosier   .  Vision loss, bilateral    .  Dizziness - giddy    .  Cerebrovascular disease      pt states she pulls to the left and staggers alot   .  Subclavian steal syndrome    .  Hypertension      takes Lisinopril daily   .  Coronary artery disease    .  Peripheral vascular disease    .  H/O blood clots      in right foot-04/24/11   .  CHF (congestive heart failure)    .  COPD (chronic obstructive pulmonary disease)    .  Pneumonia      hx of;last time about 15yrs ago   .  Foot drop      right since knee replacement   .  Arthritis    .  Osteoarthritis    .  Chronic back pain    .  Bruises easily    .  GERD (gastroesophageal reflux disease)      pt doesn't take any meds for this just monitors what she eat   .  H/O: GI bleed    .  Renal insufficiency    .  Blood transfusion  27yrs ago   .  Diabetes mellitus      but controlled with diet and exercise   .  Depression      takes Paxil daily    Past Surgical History   Procedure  Date   .  Rotator cuff repair      bilateral   .  Replacement total knee bilateral    .  Appendectomy    .  Vaginal hysterectomy    .  Hand surgery    .  Perineal abscess i & d      necrotizing soft tissue infection 2005   .  Nasal sinus surgery    .   Carpal tunnel release    .  Foot surgery      left   .  Cosmetic surgery     Family History   Problem  Relation  Age of Onset   .  Anesthesia problems  Neg Hx    .  Hypotension  Neg Hx    .  Malignant hyperthermia  Neg Hx    .  Pseudochol deficiency  Neg Hx    Social History: reports that she has been smoking. She does not have any smokeless tobacco history on file. She reports that she drinks alcohol. She reports that she does not use illicit drugs.   Allergies:  Allergies   Allergen  Reactions   .  Codeine  Nausea And Vomiting   .  Nsaids  Other (See Comments)     Gi bleed   .  Penicillins  Rash     Itching  Abdominal swelling    Meds: Medications Prior to Admission   Medication  Sig  Dispense  Refill   .  aspirin EC 325 MG tablet  Take 325 mg by mouth daily.     .  clopidogrel (PLAVIX) 75 MG tablet  Take 75 mg by mouth daily.     Marland Kitchen  lisinopril (PRINIVIL,ZESTRIL) 20 MG tablet  Take 20 mg by mouth daily.     Marland Kitchen  PARoxetine (PAXIL) 30 MG tablet  Take 30 mg by mouth 2 (two) times daily.     .  pentazocine-naloxone (TALWIN NX) 50-0.5 MG per tablet  Take 2 tablets by mouth every 4 (four) hours.     .  simvastatin (ZOCOR) 40 MG tablet  Take 80 mg by mouth at bedtime.     .  pantoprazole (PROTONIX) 40 MG tablet  Take 40 mg by mouth daily.      US Aorta  01/01/2012 *RADIOLOGY REPORT* Clinical Data: Preop evaluation. History of AAA. ULTRASOUND OF ABDOMINAL AORTA Technique: Ultrasound examination of the abdominal aorta was performed to evaluate for abdominal aortic aneurysm. Comparison: None. Abdominal Aorta: There is irregularity of the abdominal aorta with mild distal aneurysm. Maximum AP diameter: 3.1 cm. Maximum TRV diameter: 3.6 cm. IMPRESSION: Atherosclerotic irregularity throughout the aorta. 3.6 cm distal abdominal aortic aneurysm. Original Report Authenticated By: Cyndie Chime, M.D.  Review of Systems  Constitutional: Negative for fever.  Eyes: Positive for blurred vision.    Occasional blurry vision  Respiratory: Negative for cough.  Cardiovascular: Negative for chest pain.  Gastrointestinal: Negative for nausea and vomiting.  Musculoskeletal: Positive for back pain, joint pain and falls.  Arthritis  Last fall Nov 2012  Neurological: Positive for dizziness, tingling and speech change. Negative for headaches.  Dizziness and falls -last fall No 2012  Rt arm hand tingling now  Speech can be "muttered"- not new sx; since TIA several yrs ago    BP 117/73  Pulse 88  Temp(Src) 98.7 F (37.1 C) (Oral)  Resp 18  SpO2 97%  Physical Exam  Constitutional: She is oriented to person, place, and time. She appears well-developed.  HENT:  Head: Normocephalic.  Eyes: EOM are normal.  Neck: Normal range of motion.  Cardiovascular: Normal rate, regular rhythm and normal heart sounds.  No murmur heard.  Respiratory: Effort normal and breath sounds normal. She has no wheezes.  GI: Soft. Bowel sounds are normal. There is no tenderness.  Musculoskeletal: Normal range of motion.  Soreness all over; arthritis  Neurological: She is alert and oriented to person, place, and time.  Skin: Skin is warm.   Labs: CBC    Component Value Date/Time   WBC 9.3 02/21/2012 1524   RBC 4.18 02/21/2012 1524   HGB 13.8 02/21/2012 1524   HCT 41.0 02/21/2012 1524   PLT 163 02/21/2012 1524   MCV 98.1 02/21/2012 1524   MCH 33.0 02/21/2012 1524   MCHC 33.7 02/21/2012 1524   RDW 14.2 02/21/2012 1524   BMET    Component Value Date/Time   NA 135 02/21/2012 1524   K 5.1 02/21/2012 1524   CL 101 02/21/2012 1524   CO2 24 02/21/2012 1524   GLUCOSE 106* 02/21/2012 1524   BUN 37* 02/21/2012 1524   CREATININE 1.35* 02/21/2012 1524   CALCIUM 9.6 02/21/2012 1524   GFRNONAA 40* 02/21/2012 1524  GFRAA 46* 02/21/2012 1524   Prothrombin Time/INR 0.91   Assessment/Plan  Subclavian steal syndrome  Pt scheduled for (L)subclavian arteriogram with intended intervention via brachial approach. Pt and  family aware of procedure benefits and risks and agreeable to proceed.  Pre-assessment labs showed Cr 1.35. Her CR was normal for initial procedure one month ago Await repeat labs. Consent in chart  Brayton El PA-C 02/28/2012 8:13 AM

## 2012-02-28 NOTE — Procedures (Signed)
S/P lt vertbral arteriogram. Lt brachial approach Preliminary findings. 1. Occluded LT subclavian artery prox to Lt [facility] ,with brisk LT subclavian steal

## 2012-02-28 NOTE — Transfer of Care (Signed)
Immediate Anesthesia Transfer of Care Note  Patient: Jocelyn Curtis  Procedure(s) Performed: Procedure(s) (LRB): RADIOLOGY WITH ANESTHESIA (N/A)  Patient Location: PACU  Anesthesia Type: MAC  Level of Consciousness: awake, alert  and oriented  Airway & Oxygen Therapy: Patient Spontanous Breathing and Patient connected to nasal cannula oxygen  Post-op Assessment: Report given to PACU RN and Post -op Vital signs reviewed and stable  Post vital signs: Reviewed and stable  Complications: No apparent anesthesia complications

## 2012-02-29 ENCOUNTER — Telehealth (HOSPITAL_COMMUNITY): Payer: Self-pay | Admitting: *Deleted

## 2012-02-29 NOTE — Telephone Encounter (Signed)
Attempted to reach pt for post procedure follow up.  Number provided has been dc'd with no alternate number available.

## 2012-03-03 ENCOUNTER — Telehealth (HOSPITAL_COMMUNITY): Payer: Self-pay

## 2012-03-03 NOTE — Telephone Encounter (Signed)
Dr. Corliss Skains wants Mrs. Hickam to be referred to [facility] for further treatment.  I have been in touch with Drinda Butts and sent the ref to her along with procedure copies.

## 2012-03-03 NOTE — Telephone Encounter (Signed)
Jocelyn Curtis with Dr. Candie Chroman office will be seeing her on 03-18-12

## 2012-03-06 ENCOUNTER — Encounter: Payer: Self-pay | Admitting: Vascular Surgery

## 2012-03-18 ENCOUNTER — Encounter: Payer: Medicare PPO | Admitting: Vascular Surgery

## 2012-04-21 ENCOUNTER — Encounter: Payer: Self-pay | Admitting: Vascular Surgery

## 2012-04-22 ENCOUNTER — Encounter: Payer: Self-pay | Admitting: Vascular Surgery

## 2012-04-22 ENCOUNTER — Other Ambulatory Visit: Payer: Self-pay | Admitting: *Deleted

## 2012-04-22 ENCOUNTER — Ambulatory Visit (INDEPENDENT_AMBULATORY_CARE_PROVIDER_SITE_OTHER): Payer: Medicare PPO | Admitting: Vascular Surgery

## 2012-04-22 VITALS — BP 134/75 | HR 97 | Temp 98.3°F | Ht 68.5 in | Wt 206.0 lb

## 2012-04-22 DIAGNOSIS — G458 Other transient cerebral ischemic attacks and related syndromes: Secondary | ICD-10-CM

## 2012-04-22 DIAGNOSIS — Z01818 Encounter for other preprocedural examination: Secondary | ICD-10-CM

## 2012-04-22 NOTE — Progress Notes (Signed)
Subjective:     Patient ID: Jocelyn Curtis, female   DOB: 08/31/1946, 66 y.o.   MRN: [id number]  HPI this 66-year-old female was referred for subclavian steal syndrome. This patient has been having episodes of dizziness and visual changes with blurriness in both eyes left worse than right over the last several months. 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 2 attempts were made to treat this with stenting by Dr.Devesshwar in April and May of this year and both were unsuccessful. One was by the femoral route and once by the left brachial approach. She continues to have symptoms. She occasionally will have difficulty with her speech-not slurred but unable to come up with proper words. She denies any history of coronary artery disease.  Past Medical History  Diagnosis Date  . Hyperlipidemia     takes Zocor bid  . Anxiety and depression   . Osteoarthritis (arthritis due to wear and tear of joints)   . Abdominal aortic aneurysm without rupture     followed by Dr. David Grove  . Vision loss, bilateral   . Dizziness - giddy   . Cerebrovascular disease     pt states she pulls to the left and staggers alot  . Subclavian steal syndrome   . Coronary artery disease   . H/O blood clots     in right foot-04/24/11  . COPD (chronic obstructive pulmonary disease)   . Foot drop     right since knee replacement  . Chronic back pain   . Bruises easily   . GERD (gastroesophageal reflux disease)     pt doesn't take any meds for this just monitors what she eat  . H/O: GI bleed   . Blood transfusion 49yrs ago  . Diabetes mellitus     but controlled with diet and exercise  . Peripheral vascular disease     blood clot in foot following epidural anesthesia - 04/2011  . Shortness of breath     fr. exertion   . Arthritis     "all over body"  . Osteoarthritis   . Hypertension     takes  Lisinopril daily, Dr. Grove prev. took  care of pt. before his retirement   . Complication of anesthesia     ENDED WITH BLOOD CLOTT IN LEG  . Stroke     ministrokes many yrs. ago   . Dysrhythmia     IRREG HEARTBEATS  . CHF (congestive heart failure)     USED TO SEE DR GROVE  . Pneumonia     hx of;last time about 6yrs ago 2008  . Renal insufficiency     SEE MED DR [place]  . Cancer     HX SKIN CA  . Depression     takes Paxil daily  . Subclavian steal syndrome   . Carotid artery occlusion   . AAA (abdominal aortic aneurysm)     History  Substance Use Topics  . Smoking status: Former Smoker -- 0.2 packs/day for 48 years    Quit date: 02/14/2012  . Smokeless tobacco: Current User    Types: Snuff   Comment: uses snuff every once in a while  . Alcohol Use: No     occ beer    Family History  Problem Relation Age of Onset  . Anesthesia problems Neg Hx   . Hypotension Neg Hx   . Malignant hyperthermia Neg Hx   .   Pseudochol deficiency Neg Hx   . Diabetes Mother   . Heart disease Mother     before age 60  . Hyperlipidemia Mother   . Hypertension Mother   . Heart attack Mother   . Cancer Father   . Heart disease Father     before age 60  . Hyperlipidemia Father   . Hypertension Father   . Heart attack Father   . Deep vein thrombosis Sister   . Diabetes Sister   . Heart disease Sister   . Hyperlipidemia Sister   . Hypertension Sister   . Heart attack Sister   . Diabetes Brother   . Hypertension Brother   . Diabetes Daughter     Allergies  Allergen Reactions  . Codeine Nausea And Vomiting  . Nsaids Other (See Comments)    Gi bleed  . Penicillins Rash    Itching Abdominal swelling    Current outpatient prescriptions:aspirin EC 325 MG tablet, Take 325 mg by mouth daily., Disp: , Rfl: ;  clopidogrel (PLAVIX) 75 MG tablet, Take 75 mg by mouth daily., Disp: , Rfl: ;  lisinopril (PRINIVIL,ZESTRIL) 20 MG tablet, Take 20 mg by mouth daily., Disp: , Rfl: ;  pantoprazole (PROTONIX) 40 MG  tablet, Take 40 mg by mouth daily., Disp: , Rfl: ;  PARoxetine (PAXIL) 30 MG tablet, Take 60 mg by mouth every morning. , Disp: , Rfl:  pentazocine-naloxone (TALWIN NX) 50-0.5 MG per tablet, Take 1 tablet by mouth. For pain, 2 pills every 4 hours, Disp: , Rfl: ;  pravastatin (PRAVACHOL) 40 MG tablet, Take 40 mg by mouth 2 (two) times daily., Disp: , Rfl: ;  methylphenidate (CONCERTA) 36 MG CR tablet, Take 36 mg by mouth every morning., Disp: , Rfl:   BP 134/75  Pulse 97  Temp 98.3 F (36.8 C) (Oral)  Ht 5' 8.5" (1.74 m)  Wt 206 lb (93.441 kg)  BMI 30.87 kg/m2  SpO2 100%  Body mass index is 30.87 kg/(m^2).          Review of Systems has mild chest aching occasionally but does have gastroesophageal reflux disease. Denies claudication, dyspnea on exertion, PND, orthopnea.     Objective:   Physical Exam blood pressure 134/75 heart rate 97 respirations 14 Gen.-alert and oriented x3 in no apparent distress HEENT normal for age Lungs no rhonchi or wheezing Cardiovascular regular rhythm no murmurs carotid pulses 3+ palpable no bruits audible Abdomen soft nontender no palpable masses Musculoskeletal free of  major deformities Skin clear -no rashes Neurologic normal Lower extremities 3+ femoral and dorsalis pedis pulses palpable bilaterally with no edema Upper extremities with 3+ radial pulse on the right 2+ on the left.  I have reviewed the clinical records provided by Dr. Elkins and the angiograms performed . I agree that patient has total occlusion left subclavian artery with no significant carotid occlusive disease.      Assessment:     Left subclavian steal syndrome with 2 unsuccessful attempts at percutaneous intervention-total occlusion left subclavian artery with retrograde flow in left vertebral artery    Plan:     #1 nuclear medicine stress test to rule out coronary artery disease #2 plan left subclavian carotid anastomosis on Friday, August 2. Risks and benefits  including possible CVA were discussed with patient and she would like to proceed      

## 2012-04-22 NOTE — Addendum Note (Signed)
Addended by: Sharee Pimple on: 04/22/2012 10:34 AM   Modules accepted: Orders

## 2012-04-23 ENCOUNTER — Ambulatory Visit (HOSPITAL_COMMUNITY): Payer: Medicare PPO | Attending: Internal Medicine | Admitting: Radiology

## 2012-04-23 VITALS — BP 91/67 | Ht 68.0 in | Wt 200.0 lb

## 2012-04-23 DIAGNOSIS — R0602 Shortness of breath: Secondary | ICD-10-CM

## 2012-04-23 DIAGNOSIS — Z01818 Encounter for other preprocedural examination: Secondary | ICD-10-CM

## 2012-04-23 DIAGNOSIS — G458 Other transient cerebral ischemic attacks and related syndromes: Secondary | ICD-10-CM

## 2012-04-23 DIAGNOSIS — R079 Chest pain, unspecified: Secondary | ICD-10-CM

## 2012-04-23 MED ORDER — TECHNETIUM TC 99M TETROFOSMIN IV KIT
33.0000 | PACK | Freq: Once | INTRAVENOUS | Status: AC | PRN
  Administered 2012-04-23: 33 via INTRAVENOUS

## 2012-04-23 MED ORDER — TECHNETIUM TC 99M TETROFOSMIN IV KIT
11.0000 | PACK | Freq: Once | INTRAVENOUS | Status: AC | PRN
  Administered 2012-04-23: 11 via INTRAVENOUS

## 2012-04-23 MED ORDER — REGADENOSON 0.4 MG/5ML IV SOLN
0.4000 mg | Freq: Once | INTRAVENOUS | Status: AC
  Administered 2012-04-23: 0.4 mg via INTRAVENOUS

## 2012-04-23 NOTE — Progress Notes (Signed)
[facility] SITE 3 NUCLEAR MED [address] [place] [address] [phone]  Cardiology Nuclear Med Study  Jocelyn Curtis is a 66 y.o. female     MRN : [id number]     DOB: 28-Jun-1946  Procedure Date: 04/23/2012  Nuclear Med Background Indication for Stress Test:  Evaluation for Ischemia, and Surgical Clearance for  Carotid Anastomosis on 05-02-12, by Dr. [name], MD History:  COPD and 02/13 ECHO: 60-65% mild Lvh, mild TVR, AAA with rupture  Cardiac Risk Factors: Carotid Disease, Family History - CAD, History of Smoking, Lipids, NIDDM, PVD and TIA  Symptoms:  Chest Pressure, Dizziness, DOE and SOB   Nuclear Pre-Procedure Caffeine/Decaff Intake:  None > 12 hrs NPO After: 8:00pm   Lungs:  clear O2 Sat: 95% on room air. IV 0.9% NS with Angio Cath:  20g  IV Site: R Antecubital x 1, tolerated well IV Started by:  Irean Hong, RN  Chest Size (in):  42 Cup Size: C  Height: 5\' 8"  (1.727 m)  Weight:  200 lb (90.719 kg)  BMI:  Body mass index is 30.41 kg/(m^2). Tech Comments:  n/a    Nuclear Med Study 1 or 2 day study: 1 day  Stress Test Type:  Lexiscan  Reading MD: Arvilla Meres, MD  Order Authorizing Provider:  [name], MD  Resting Radionuclide: Technetium 46m Tetrofosmin  Resting Radionuclide Dose: 11.0 mCi   Stress Radionuclide:  Technetium 94m Tetrofosmin  Stress Radionuclide Dose: 33.0 mCi           Stress Protocol Rest HR: 71 Stress HR: 91  Rest BP: 91/67 Stress BP: 104/68  Exercise Time (min): n/a METS: n/a   Predicted Max HR: 154 bpm % Max HR: 59.09 bpm Rate Pressure Product: 9464   Dose of Adenosine (mg):  n/a Dose of Lexiscan: 0.4 mg  Dose of Atropine (mg): n/a Dose of Dobutamine: n/a mcg/kg/min (at max HR)  Stress Test Technologist: Milana Na, EMT-P  Nuclear Technologist:  Domenic Polite, CNMT     Rest Procedure:  Myocardial perfusion imaging was performed at rest 45 minutes following the intravenous administration of  Technetium 26m Tetrofosmin. Rest ECG: NSR - Normal EKG  Stress Procedure:  The patient received IV Lexiscan 0.4 mg over 15-seconds.  Technetium 33m Tetrofosmin injected at 30-seconds.  There were no significant changes, + sob, Lt. Headed, and a cough with Lexiscan.  Quantitative spect images were obtained after a 45 minute delay. Stress ECG: No significant change from baseline ECG  QPS Raw Data Images:  Normal; no motion artifact; normal heart/lung ratio. Stress Images:  Normal homogeneous uptake in all areas of the myocardium. Rest Images:  Normal homogeneous uptake in all areas of the myocardium. Subtraction (SDS):  Normal Transient Ischemic Dilatation (Normal <1.22):  1.09 Lung/Heart Ratio (Normal <0.45):  0.20  Quantitative Gated Spect Images QGS EDV:  78 ml QGS ESV:  25 ml  Impression Exercise Capacity:  Lexiscan with no exercise. BP Response:  n/a Clinical Symptoms:  n/a ECG Impression:  No significant ECG changes with Lexiscan. Comparison with Prior Nuclear Study: No images to compare  Overall Impression:  Normal stress nuclear study.  LV Ejection Fraction: 68%.  LV Wall Motion:  NL LV Function; NL Wall Motion    Shyloh Derosa,MD 9:19 PM

## 2012-04-28 NOTE — Pre-Procedure Instructions (Signed)
20 Jocelyn Curtis  04/28/2012   Your procedure is scheduled on:  Fri, Aug 2 @ 7:30 AM  Report to [facility] at 5:30 AM.  Call this number if you have problems the morning of surgery: [phone]   Remember:   Do not eat food:After Midnight.    Take these medicines the morning of surgery with A SIP OF WATER: Pantoprazole(Protonix) and Paxil(Paroxetine)   Do not wear jewelry, make-up or nail polish.  Do not wear lotions, powders, or perfumes.   Do not shave 48 hours prior to surgery.  Do not bring valuables to the hospital.  Contacts, dentures or bridgework may not be worn into surgery.  Leave suitcase in the car. After surgery it may be brought to your room.  For patients admitted to the hospital, checkout time is 11:00 AM the day of discharge.   Patients discharged the day of surgery will not be allowed to drive home.  Special Instructions: CHG Shower Use Special Wash: 1/2 bottle night before surgery and 1/2 bottle morning of surgery.   Please read over the following fact sheets that you were given: Pain Booklet, Coughing and Deep Breathing, Blood Transfusion Information, MRSA Information and Surgical Site Infection Prevention

## 2012-04-29 ENCOUNTER — Encounter (HOSPITAL_COMMUNITY)
Admission: RE | Admit: 2012-04-29 | Discharge: 2012-04-29 | Disposition: A | Discharge status: Home / Self Care | Payer: Medicare PPO | Source: Ambulatory Visit | Attending: Vascular Surgery | Admitting: Vascular Surgery

## 2012-04-29 ENCOUNTER — Encounter (HOSPITAL_COMMUNITY): Payer: Self-pay

## 2012-04-29 HISTORY — DX: Dizziness and giddiness: R42

## 2012-04-29 HISTORY — DX: Unspecified visual loss: H54.7

## 2012-04-29 HISTORY — DX: Personal history of urinary (tract) infections: Z87.440

## 2012-04-29 LAB — ABO/RH: ABO/RH(D): O POS

## 2012-04-29 LAB — COMPREHENSIVE METABOLIC PANEL
ALT: 10 U/L (ref 0–35)
Alkaline Phosphatase: 79 U/L (ref 39–117)
CO2: 27 mEq/L (ref 19–32)
GFR calc Af Amer: 44 mL/min — ABNORMAL LOW (ref >90)
GFR calc non Af Amer: 38 mL/min — ABNORMAL LOW (ref >90)
Glucose, Bld: 113 mg/dL — ABNORMAL HIGH (ref 70–99)
Potassium: 3.9 mEq/L (ref 3.5–5.1)
Sodium: 139 mEq/L (ref 135–145)

## 2012-04-29 LAB — PROTIME-INR
INR: 0.91 (ref 0.00–1.49)
Prothrombin Time: 12.4 seconds (ref 11.6–15.2)

## 2012-04-29 LAB — CBC
Hemoglobin: 14.4 g/dL (ref 12.0–15.0)
RBC: 4.38 MIL/uL (ref 3.87–5.11)
WBC: 7.9 10*3/uL (ref 4.0–10.5)

## 2012-04-29 LAB — SURGICAL PCR SCREEN: Staphylococcus aureus: NEGATIVE

## 2012-04-29 LAB — URINALYSIS, ROUTINE W REFLEX MICROSCOPIC
Bilirubin Urine: NEGATIVE
Glucose, UA: NEGATIVE mg/dL
Ketones, ur: NEGATIVE mg/dL
Leukocytes, UA: NEGATIVE
Specific Gravity, Urine: 1.022 (ref 1.005–1.030)
pH: 5.5 (ref 5.0–8.0)

## 2012-04-29 LAB — APTT: aPTT: 30 seconds (ref 24–37)

## 2012-04-29 NOTE — Progress Notes (Signed)
Doesn't have a cardiologist  Stress test results in epic from 04/23/12-was ordered by [facility] Echo in epic from 2013 was ordered by Deveshwar  EKG and CXR in epic from 12/27/11  Medical MD is Dr.Wilson Shelah Lewandowsky in [employer]

## 2012-04-29 NOTE — Consult Note (Signed)
Anesthesia chart review: Patient is a 66 year old female scheduled for left carotid subclavian anastomosis by Dr. Hart Rochester on 05/02/12.  She has had two previous unsuccessful percutaneous interventions and was found to have total occlusion of her  left subclavian artery with retrograde flow in left vertebral artery.    I spoke with her prior to her first attempt in April 2013.  She reported a vague history of CAD but denied history of cath, MI, CABG, or stent.  She had previously been followed by Dr. Guadlupe Spanish, now retired.  She also reported a post-operative "blood clot" following her right TKA ~ 04/2011, but I reviewed her D/C summary then and did not see that mentioned.  Other history includes former smoker, mild obesity with BMI 31, COPD, chronic DOE, HTN, "irregular heartbeats" without mention of afib, anxiety, depression, diet controlled DM2, chronic renal insufficiency, GERD, skin cancer, OA, right foot drop following TKR, CVA, history of GI bleed, AAA 3.6 cm by ultrasound 01/01/12, multiple prior surgeries.  EKG on 12/27/11 showed NSR.  Echo on 11/22/11 showed:  Left ventricle: Wall thickness was increased in a pattern of mild LVH. There was focal basal hypertrophy. Systolic function was normal. The estimated ejection fraction was in the range of 60% to 65%. There was an increased relative contribution of atrial contraction to ventricular filling. Mild TR.   On 04/23/12, she had a normal stress nuclear study.  LV Ejection Fraction: 68%. LV Wall Motion: NL LV Function; NL Wall Motion.   CXR on 12/27/11 showed no active disease.  Labs noted.  BUN 30, Cr 1.42.  Glucose 113.  CBC, PT/PTT WNL.    Plan to proceed if no significant change in her status.  Shonna Chock, PA-C

## 2012-04-30 MED ORDER — BUPIVACAINE-EPINEPHRINE (PF) 0.5% -1:200000 IJ SOLN
INTRAMUSCULAR | Status: AC
  Filled 2012-04-30: qty 10

## 2012-05-01 MED ORDER — VANCOMYCIN HCL 1000 MG IV SOLR
1500.0000 mg | INTRAVENOUS | Status: AC
  Administered 2012-05-02: 1500 mg via INTRAVENOUS
  Filled 2012-05-01: qty 1500

## 2012-05-01 MED ORDER — SODIUM CHLORIDE 0.9 % IV SOLN: INTRAVENOUS | Status: DC

## 2012-05-02 ENCOUNTER — Telehealth: Payer: Self-pay | Admitting: Vascular Surgery

## 2012-05-02 ENCOUNTER — Encounter (HOSPITAL_COMMUNITY): Payer: Self-pay | Admitting: Vascular Surgery

## 2012-05-02 ENCOUNTER — Encounter (HOSPITAL_COMMUNITY): Payer: Self-pay | Admitting: *Deleted

## 2012-05-02 ENCOUNTER — Inpatient Hospital Stay (HOSPITAL_COMMUNITY)
Admission: RE | Admit: 2012-05-02 | Discharge: 2012-05-03 | DRG: 039 | Disposition: A | Discharge status: Home / Self Care | Payer: Medicare PPO | Source: Ambulatory Visit | Attending: Vascular Surgery | Admitting: Vascular Surgery

## 2012-05-02 ENCOUNTER — Ambulatory Visit (HOSPITAL_COMMUNITY): Payer: Medicare PPO | Admitting: Vascular Surgery

## 2012-05-02 ENCOUNTER — Encounter (HOSPITAL_COMMUNITY)
Admission: RE | Disposition: A | Discharge status: Home / Self Care | Payer: Self-pay | Source: Ambulatory Visit | Attending: Vascular Surgery

## 2012-05-02 DIAGNOSIS — Z7902 Long term (current) use of antithrombotics/antiplatelets: Secondary | ICD-10-CM

## 2012-05-02 DIAGNOSIS — N289 Disorder of kidney and ureter, unspecified: Secondary | ICD-10-CM | POA: Diagnosis present

## 2012-05-02 DIAGNOSIS — Z86718 Personal history of other venous thrombosis and embolism: Secondary | ICD-10-CM

## 2012-05-02 DIAGNOSIS — I714 Abdominal aortic aneurysm, without rupture, unspecified: Secondary | ICD-10-CM | POA: Diagnosis present

## 2012-05-02 DIAGNOSIS — J4489 Other specified chronic obstructive pulmonary disease: Secondary | ICD-10-CM | POA: Diagnosis present

## 2012-05-02 DIAGNOSIS — J449 Chronic obstructive pulmonary disease, unspecified: Secondary | ICD-10-CM | POA: Diagnosis present

## 2012-05-02 DIAGNOSIS — F411 Generalized anxiety disorder: Secondary | ICD-10-CM | POA: Diagnosis present

## 2012-05-02 DIAGNOSIS — Z01812 Encounter for preprocedural laboratory examination: Secondary | ICD-10-CM

## 2012-05-02 DIAGNOSIS — F329 Major depressive disorder, single episode, unspecified: Secondary | ICD-10-CM | POA: Diagnosis present

## 2012-05-02 DIAGNOSIS — Z7982 Long term (current) use of aspirin: Secondary | ICD-10-CM

## 2012-05-02 DIAGNOSIS — K219 Gastro-esophageal reflux disease without esophagitis: Secondary | ICD-10-CM | POA: Diagnosis present

## 2012-05-02 DIAGNOSIS — G458 Other transient cerebral ischemic attacks and related syndromes: Principal | ICD-10-CM | POA: Diagnosis present

## 2012-05-02 DIAGNOSIS — E119 Type 2 diabetes mellitus without complications: Secondary | ICD-10-CM | POA: Diagnosis present

## 2012-05-02 DIAGNOSIS — I2510 Atherosclerotic heart disease of native coronary artery without angina pectoris: Secondary | ICD-10-CM | POA: Diagnosis present

## 2012-05-02 DIAGNOSIS — Z87891 Personal history of nicotine dependence: Secondary | ICD-10-CM

## 2012-05-02 DIAGNOSIS — M216X9 Other acquired deformities of unspecified foot: Secondary | ICD-10-CM | POA: Diagnosis present

## 2012-05-02 DIAGNOSIS — E785 Hyperlipidemia, unspecified: Secondary | ICD-10-CM | POA: Diagnosis present

## 2012-05-02 DIAGNOSIS — Z85828 Personal history of other malignant neoplasm of skin: Secondary | ICD-10-CM

## 2012-05-02 DIAGNOSIS — F3289 Other specified depressive episodes: Secondary | ICD-10-CM | POA: Diagnosis present

## 2012-05-02 DIAGNOSIS — I739 Peripheral vascular disease, unspecified: Secondary | ICD-10-CM | POA: Diagnosis present

## 2012-05-02 DIAGNOSIS — M1990 Unspecified osteoarthritis, unspecified site: Secondary | ICD-10-CM | POA: Diagnosis present

## 2012-05-02 DIAGNOSIS — I6529 Occlusion and stenosis of unspecified carotid artery: Secondary | ICD-10-CM | POA: Diagnosis present

## 2012-05-02 DIAGNOSIS — Z96659 Presence of unspecified artificial knee joint: Secondary | ICD-10-CM

## 2012-05-02 HISTORY — PX: CAROTID-SUBCLAVIAN BYPASS GRAFT: SHX910

## 2012-05-02 HISTORY — PX: ENDARTERECTOMY: SHX5162

## 2012-05-02 LAB — GLUCOSE, CAPILLARY

## 2012-05-02 SURGERY — CREATION, BYPASS, ARTERIAL, SUBCLAVIAN TO CAROTID, USING GRAFT
Anesthesia: General | Site: Neck | Laterality: Left | Wound class: Clean

## 2012-05-02 MED ORDER — VECURONIUM BROMIDE 10 MG IV SOLR
INTRAVENOUS | Status: DC | PRN
  Administered 2012-05-02: 2 mg via INTRAVENOUS
  Administered 2012-05-02 (×2): 1 mg via INTRAVENOUS
  Administered 2012-05-02: 2 mg via INTRAVENOUS

## 2012-05-02 MED ORDER — ROCURONIUM BROMIDE 100 MG/10ML IV SOLN
INTRAVENOUS | Status: DC | PRN
  Administered 2012-05-02: 50 mg via INTRAVENOUS

## 2012-05-02 MED ORDER — ONDANSETRON HCL 4 MG/2ML IJ SOLN: 4.0000 mg | Freq: Once | INTRAMUSCULAR | Status: DC | PRN

## 2012-05-02 MED ORDER — PROPOFOL 10 MG/ML IV BOLUS
INTRAVENOUS | Status: DC | PRN
  Administered 2012-05-02: 160 mg via INTRAVENOUS

## 2012-05-02 MED ORDER — LACTATED RINGERS IV SOLN
INTRAVENOUS | Status: DC | PRN
  Administered 2012-05-02 (×2): via INTRAVENOUS

## 2012-05-02 MED ORDER — ONDANSETRON HCL 4 MG/2ML IJ SOLN: 4.0000 mg | Freq: Four times a day (QID) | INTRAMUSCULAR | Status: DC | PRN

## 2012-05-02 MED ORDER — HYDROMORPHONE HCL PF 1 MG/ML IJ SOLN
INTRAMUSCULAR | Status: AC
  Filled 2012-05-02: qty 1

## 2012-05-02 MED ORDER — HEPARIN SODIUM (PORCINE) 1000 UNIT/ML IJ SOLN
INTRAMUSCULAR | Status: DC | PRN
  Administered 2012-05-02: 6000 [IU] via INTRAVENOUS

## 2012-05-02 MED ORDER — ACETAMINOPHEN 650 MG RE SUPP: 325.0000 mg | RECTAL | Status: DC | PRN

## 2012-05-02 MED ORDER — PHENYLEPHRINE HCL 10 MG/ML IJ SOLN
10.0000 mg | INTRAVENOUS | Status: DC | PRN
  Administered 2012-05-02: 1 ug/min via INTRAVENOUS

## 2012-05-02 MED ORDER — SENNOSIDES-DOCUSATE SODIUM 8.6-50 MG PO TABS
1.0000 | ORAL_TABLET | Freq: Every evening | ORAL | Status: DC | PRN
  Filled 2012-05-02: qty 1

## 2012-05-02 MED ORDER — METHYLPHENIDATE HCL ER (OSM) 18 MG PO TBCR: 36.0000 mg | EXTENDED_RELEASE_TABLET | ORAL | Status: DC

## 2012-05-02 MED ORDER — HYDRALAZINE HCL 20 MG/ML IJ SOLN: 10.0000 mg | INTRAMUSCULAR | Status: DC | PRN

## 2012-05-02 MED ORDER — BISACODYL 10 MG RE SUPP: 10.0000 mg | Freq: Every day | RECTAL | Status: DC | PRN

## 2012-05-02 MED ORDER — PHENOL 1.4 % MT LIQD: 1.0000 | OROMUCOSAL | Status: DC | PRN

## 2012-05-02 MED ORDER — VANCOMYCIN HCL IN DEXTROSE 1-5 GM/200ML-% IV SOLN
1000.0000 mg | Freq: Two times a day (BID) | INTRAVENOUS | Status: AC
  Administered 2012-05-02 – 2012-05-03 (×2): 1000 mg via INTRAVENOUS
  Filled 2012-05-02 (×2): qty 200

## 2012-05-02 MED ORDER — NEOSTIGMINE METHYLSULFATE 1 MG/ML IJ SOLN
INTRAMUSCULAR | Status: DC | PRN
  Administered 2012-05-02: 5 mg via INTRAVENOUS

## 2012-05-02 MED ORDER — GLYCOPYRROLATE 0.2 MG/ML IJ SOLN
INTRAMUSCULAR | Status: DC | PRN
  Administered 2012-05-02: .6 mg via INTRAVENOUS

## 2012-05-02 MED ORDER — DOCUSATE SODIUM 100 MG PO CAPS
100.0000 mg | ORAL_CAPSULE | Freq: Every day | ORAL | Status: DC
  Administered 2012-05-03: 100 mg via ORAL
  Filled 2012-05-02: qty 1

## 2012-05-02 MED ORDER — ESMOLOL HCL 10 MG/ML IV SOLN
INTRAVENOUS | Status: DC | PRN
  Administered 2012-05-02: 20 mg via INTRAVENOUS

## 2012-05-02 MED ORDER — 0.9 % SODIUM CHLORIDE (POUR BTL) OPTIME
TOPICAL | Status: DC | PRN
  Administered 2012-05-02: 1000 mL

## 2012-05-02 MED ORDER — ASPIRIN EC 325 MG PO TBEC
325.0000 mg | DELAYED_RELEASE_TABLET | Freq: Every day | ORAL | Status: DC
  Administered 2012-05-03: 325 mg via ORAL
  Filled 2012-05-02: qty 1

## 2012-05-02 MED ORDER — GUAIFENESIN-DM 100-10 MG/5ML PO SYRP
15.0000 mL | ORAL_SOLUTION | ORAL | Status: DC | PRN
  Administered 2012-05-02 – 2012-05-03 (×2): 15 mL via ORAL
  Filled 2012-05-02 (×2): qty 15

## 2012-05-02 MED ORDER — SUFENTANIL CITRATE 50 MCG/ML IV SOLN
INTRAVENOUS | Status: DC | PRN
  Administered 2012-05-02 (×4): 10 ug via INTRAVENOUS

## 2012-05-02 MED ORDER — POTASSIUM CHLORIDE IN NACL 20-0.9 MEQ/L-% IV SOLN
INTRAVENOUS | Status: DC
  Administered 2012-05-02: 22:00:00 via INTRAVENOUS
  Filled 2012-05-02 (×4): qty 1000

## 2012-05-02 MED ORDER — LISINOPRIL 20 MG PO TABS
20.0000 mg | ORAL_TABLET | Freq: Every day | ORAL | Status: DC
  Administered 2012-05-03: 20 mg via ORAL
  Filled 2012-05-02: qty 1

## 2012-05-02 MED ORDER — HYDROMORPHONE HCL PF 1 MG/ML IJ SOLN
0.5000 mg | INTRAMUSCULAR | Status: DC | PRN
  Administered 2012-05-02 (×2): 0.5 mg via INTRAVENOUS
  Administered 2012-05-03 (×2): 1 mg via INTRAVENOUS
  Filled 2012-05-02 (×4): qty 1

## 2012-05-02 MED ORDER — HYDROMORPHONE HCL PF 1 MG/ML IJ SOLN
0.2500 mg | INTRAMUSCULAR | Status: DC | PRN
  Administered 2012-05-02 (×2): 0.5 mg via INTRAVENOUS

## 2012-05-02 MED ORDER — POTASSIUM CHLORIDE CRYS ER 20 MEQ PO TBCR: 20.0000 meq | EXTENDED_RELEASE_TABLET | Freq: Once | ORAL | Status: AC | PRN

## 2012-05-02 MED ORDER — PAROXETINE HCL 30 MG PO TABS
60.0000 mg | ORAL_TABLET | ORAL | Status: DC
  Administered 2012-05-03: 60 mg via ORAL
  Filled 2012-05-02 (×2): qty 2

## 2012-05-02 MED ORDER — METOPROLOL TARTRATE 1 MG/ML IV SOLN: 2.0000 mg | INTRAVENOUS | Status: DC | PRN

## 2012-05-02 MED ORDER — PROTAMINE SULFATE 10 MG/ML IV SOLN
INTRAVENOUS | Status: DC | PRN
  Administered 2012-05-02: 50 mg via INTRAVENOUS

## 2012-05-02 MED ORDER — LABETALOL HCL 5 MG/ML IV SOLN: 10.0000 mg | INTRAVENOUS | Status: DC | PRN

## 2012-05-02 MED ORDER — SIMVASTATIN 20 MG PO TABS
20.0000 mg | ORAL_TABLET | Freq: Every day | ORAL | Status: DC
  Administered 2012-05-02: 20 mg via ORAL
  Filled 2012-05-02 (×2): qty 1

## 2012-05-02 MED ORDER — PANTOPRAZOLE SODIUM 40 MG PO TBEC
40.0000 mg | DELAYED_RELEASE_TABLET | Freq: Every day | ORAL | Status: DC
  Administered 2012-05-03: 40 mg via ORAL
  Filled 2012-05-02: qty 1

## 2012-05-02 MED ORDER — ACETAMINOPHEN 325 MG PO TABS: 325.0000 mg | ORAL_TABLET | ORAL | Status: DC | PRN

## 2012-05-02 MED ORDER — PENTAZOCINE-NALOXONE HCL 50-0.5 MG PO TABS: 2.0000 | ORAL_TABLET | ORAL | Status: DC | PRN

## 2012-05-02 MED ORDER — DOPAMINE-DEXTROSE 3.2-5 MG/ML-% IV SOLN: 3.0000 ug/kg/min | INTRAVENOUS | Status: DC

## 2012-05-02 MED ORDER — SODIUM CHLORIDE 0.9 % IR SOLN
Status: DC | PRN
  Administered 2012-05-02: 08:00:00

## 2012-05-02 MED ORDER — MIDAZOLAM HCL 5 MG/5ML IJ SOLN
INTRAMUSCULAR | Status: DC | PRN
  Administered 2012-05-02: 1 mg via INTRAVENOUS

## 2012-05-02 MED ORDER — CLOPIDOGREL BISULFATE 75 MG PO TABS
75.0000 mg | ORAL_TABLET | Freq: Every day | ORAL | Status: DC
  Administered 2012-05-03: 75 mg via ORAL
  Filled 2012-05-02: qty 1

## 2012-05-02 MED ORDER — LIDOCAINE HCL (CARDIAC) 20 MG/ML IV SOLN
INTRAVENOUS | Status: DC | PRN
  Administered 2012-05-02: 60 mg via INTRAVENOUS

## 2012-05-02 MED ORDER — ONDANSETRON HCL 4 MG/2ML IJ SOLN
INTRAMUSCULAR | Status: DC | PRN
  Administered 2012-05-02: 4 mg via INTRAVENOUS

## 2012-05-02 MED ORDER — PENTAZOCINE-NALOXONE HCL 50-0.5 MG PO TABS: 1.0000 | ORAL_TABLET | ORAL | Status: DC | PRN

## 2012-05-02 MED ORDER — SODIUM CHLORIDE 0.9 % IV SOLN: 500.0000 mL | Freq: Once | INTRAVENOUS | Status: AC | PRN

## 2012-05-02 SURGICAL SUPPLY — 42 items
CANISTER SUCTION 2500CC (MISCELLANEOUS) ×2 IMPLANT
CATH ROBINSON RED A/P 18FR (CATHETERS) ×2 IMPLANT
CATH SUCT 10FR WHISTLE TIP (CATHETERS) ×2 IMPLANT
CLIP TI MEDIUM 24 (CLIP) ×2 IMPLANT
CLIP TI WIDE RED SMALL 24 (CLIP) ×2 IMPLANT
CLOTH BEACON ORANGE TIMEOUT ST (SAFETY) ×2 IMPLANT
COVER SURGICAL LIGHT HANDLE (MISCELLANEOUS) ×2 IMPLANT
CRADLE DONUT ADULT HEAD (MISCELLANEOUS) ×2 IMPLANT
DECANTER SPIKE VIAL GLASS SM (MISCELLANEOUS) IMPLANT
DRAIN HEMOVAC 1/8 X 5 (WOUND CARE) IMPLANT
DRAPE WARM FLUID 44X44 (DRAPE) ×2 IMPLANT
DRSG COVADERM 4X8 (GAUZE/BANDAGES/DRESSINGS) ×2 IMPLANT
ELECT REM PT RETURN 9FT ADLT (ELECTROSURGICAL) ×2
ELECTRODE REM PT RTRN 9FT ADLT (ELECTROSURGICAL) ×1 IMPLANT
EVACUATOR SILICONE 100CC (DRAIN) IMPLANT
GLOVE BIO SURGEON STRL SZ 6.5 (GLOVE) ×4 IMPLANT
GLOVE BIOGEL PI IND STRL 6.5 (GLOVE) ×4 IMPLANT
GLOVE BIOGEL PI IND STRL 7.0 (GLOVE) ×1 IMPLANT
GLOVE BIOGEL PI INDICATOR 6.5 (GLOVE) ×4
GLOVE BIOGEL PI INDICATOR 7.0 (GLOVE) ×1
GLOVE ECLIPSE 6.5 STRL STRAW (GLOVE) ×6 IMPLANT
GLOVE SS BIOGEL STRL SZ 7 (GLOVE) ×1 IMPLANT
GLOVE SUPERSENSE BIOGEL SZ 7 (GLOVE) ×1
GOWN STRL NON-REIN LRG LVL3 (GOWN DISPOSABLE) ×6 IMPLANT
INSERT FOGARTY SM (MISCELLANEOUS) ×2 IMPLANT
KIT BASIN OR (CUSTOM PROCEDURE TRAY) ×2 IMPLANT
KIT ROOM TURNOVER OR (KITS) ×2 IMPLANT
NEEDLE 22X1 1/2 (OR ONLY) (NEEDLE) IMPLANT
NS IRRIG 1000ML POUR BTL (IV SOLUTION) ×4 IMPLANT
PACK CAROTID (CUSTOM PROCEDURE TRAY) ×2 IMPLANT
PAD ARMBOARD 7.5X6 YLW CONV (MISCELLANEOUS) ×4 IMPLANT
PUNCH AORTIC ROTATE 5MM 8IN (MISCELLANEOUS) ×2 IMPLANT
SUT PROLENE 5 0 C 1 24 (SUTURE) ×2 IMPLANT
SUT PROLENE 6 0 CC (SUTURE) ×2 IMPLANT
SUT SILK 2 0 FS (SUTURE) ×2 IMPLANT
SUT VIC AB 2-0 CT1 27 (SUTURE) ×1
SUT VIC AB 2-0 CT1 TAPERPNT 27 (SUTURE) ×1 IMPLANT
SUT VIC AB 3-0 X1 27 (SUTURE) ×2 IMPLANT
SYR CONTROL 10ML LL (SYRINGE) IMPLANT
TOWEL OR 17X24 6PK STRL BLUE (TOWEL DISPOSABLE) ×4 IMPLANT
TOWEL OR 17X26 10 PK STRL BLUE (TOWEL DISPOSABLE) ×2 IMPLANT
WATER STERILE IRR 1000ML POUR (IV SOLUTION) ×2 IMPLANT

## 2012-05-02 NOTE — Anesthesia Preprocedure Evaluation (Addendum)
Anesthesia Evaluation  Patient identified by MRN, date of birth, ID band Patient awake    Reviewed: Allergy & Precautions, H&P , NPO status , Patient's Chart, lab work & pertinent test results, reviewed documented beta blocker date and time   Airway  TM Distance: >3 FB Neck ROM: full    Dental   Pulmonary shortness of breath, COPDCurrent Smoker, former smoker,          Cardiovascular hypertension, Pt. on medications + CAD, + Peripheral Vascular Disease and +CHF + dysrhythmias Rhythm:regular Rate:Normal     Neuro/Psych PSYCHIATRIC DISORDERS Depression CVA    GI/Hepatic GERD-  ,  Endo/Other  Type 2  Renal/GU      Musculoskeletal   Abdominal   Peds  Hematology   Anesthesia Other Findings   Reproductive/Obstetrics                         Anesthesia Physical Anesthesia Plan  ASA: IV  Anesthesia Plan: General   Post-op Pain Management:    Induction: Intravenous  Airway Management Planned: Oral ETT  Additional Equipment: Arterial line  Intra-op Plan:   Post-operative Plan: Possible Post-op intubation/ventilation  Informed Consent: I have reviewed the patients History and Physical, chart, labs and discussed the procedure including the risks, benefits and alternatives for the proposed anesthesia with the patient or authorized representative who has indicated his/her understanding and acceptance.   Dental advisory given  Plan Discussed with: CRNA, Anesthesiologist and Surgeon  Anesthesia Plan Comments:        Anesthesia Quick Evaluation

## 2012-05-02 NOTE — Discharge Summary (Signed)
[facility] Discharge Summary   Patient ID:  Jocelyn Curtis MRN: [id number] DOB/AGE: November 30, 1945 66 y.o.  Admit date: 05/02/2012 Discharge date: 05/02/2012 Date of Surgery: 05/02/2012 Surgeon: Surgeon(s): Pryor Ochoa, MD  Admission Diagnosis: LEFT SUBCLAVIAN OCCLUSION WITH STEAL  Discharge Diagnoses:  LEFT SUBCLAVIAN OCCLUSION WITH STEAL  Secondary Diagnoses: Past Medical History  Diagnosis Date  . Hyperlipidemia     takes Pravastatin daily  . Anxiety and depression   . Osteoarthritis (arthritis due to wear and tear of joints)   . Abdominal aortic aneurysm without rupture     followed by Dr. Aggie Cosier  . Dizziness - giddy   . Cerebrovascular disease     pt states she pulls to the left and staggers alot  . Subclavian steal syndrome   . Coronary artery disease   . H/O blood clots     in right foot-04/24/11  . COPD (chronic obstructive pulmonary disease)   . Foot drop     right since knee replacement  . Chronic back pain   . Bruises easily   . H/O: GI bleed   . Blood transfusion 67yrs ago  . Diabetes mellitus     but controlled with diet and exercise  . Peripheral vascular disease     blood clot in foot following epidural anesthesia - 04/2011  . Arthritis     "all over body"  . Osteoarthritis   . Hypertension     takes  Lisinopril daily, Dr. Donnal Moat. took care of pt. before his retirement   . Complication of anesthesia     ENDED WITH BLOOD CLOTT IN LEG  . Stroke     ministrokes many yrs. ago   . Dysrhythmia     IRREG HEARTBEATS  . CHF (congestive heart failure)     USED TO SEE DR GROVE  . Pneumonia     hx of;last time about 29yrs ago 2008  . Renal insufficiency     SEE MED DR Shelah Lewandowsky [place]  . Cancer     HX SKIN CA  . Depression     takes Paxil daily  . Subclavian steal syndrome   . Carotid artery occlusion   . AAA (abdominal aortic aneurysm)   . Shortness of breath     with exertion  . Dizziness   . Vision loss     left  eye  . GERD (gastroesophageal reflux disease)     takes Protonix daily  . History of bladder infections     Procedure(s): BYPASS GRAFT CAROTID-SUBCLAVIAN ENDARTERECTOMY SUBCLAVIAN  Discharged Condition: good  HPI: Jocelyn Curtis is a 66 y.o. female was referred for subclavian steal syndrome. This patient has been having episodes of dizziness and visual changes with blurriness in both eyes left worse than right over the last several months. 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 2 attempts were made to treat this with stenting by Dr.Devesshwar in April and May of this year and both were unsuccessful. One was by the femoral route and once by the left brachial approach. She continues to have symptoms. She occasionally will have difficulty with her speech-not slurred but unable to come up with proper words. She denies any history of coronary artery disease. She is admitted for Left carotid-subclavian anastomosis  Hospital Course:  Jocelyn Curtis is a 66 y.o. female is S/P Left Procedure(s): BYPASS GRAFT CAROTID-SUBCLAVIAN ENDARTERECTOMY SUBCLAVIAN Extubated: POD #  0 Post-op wounds healing well Pt. Ambulating, voiding and taking PO diet without difficulty. Pt has 2+ radial pulse palpable on left Pt pain controlled with PO pain meds. Labs as below Complications:none  Consults:     Significant Diagnostic Studies: CBC Lab Results  Component Value Date   WBC 7.9 04/29/2012   HGB 14.4 04/29/2012   HCT 43.1 04/29/2012   MCV 98.4 04/29/2012   PLT 162 04/29/2012    BMET    Component Value Date/Time   NA 139 04/29/2012 1030   K 3.9 04/29/2012 1030   CL 101 04/29/2012 1030   CO2 27 04/29/2012 1030   GLUCOSE 113* 04/29/2012 1030   BUN 30* 04/29/2012 1030   CREATININE 1.42* 04/29/2012 1030   CALCIUM 9.6 04/29/2012 1030   GFRNONAA 38* 04/29/2012 1030   GFRAA 44* 04/29/2012 1030   COAG Lab Results  Component  Value Date   INR 0.91 04/29/2012   INR 0.91 01/25/2012   INR 0.90 12/27/2011     Disposition:  Discharge to :Home Discharge Orders    Future Appointments: Provider: Department: Dept Phone: Center:   05/20/2012 8:45 AM Pryor Ochoa, MD [facility] [phone] [facility]     Future Orders Please Complete By Expires   Resume previous diet      Driving Restrictions      Comments:   No driving for 2 weeks   Lifting restrictions      Comments:   No lifting for 4 weeks   Call MD for:  temperature >100.5      Call MD for:  redness, tenderness, or signs of infection (pain, swelling, bleeding, redness, odor or green/yellow discharge around incision site)      Call MD for:  severe or increased pain, loss or decreased feeling  in affected limb(s)      Increase activity slowly      Comments:   Walk with assistance use walker or cane as needed   May shower       Scheduling Instructions:   Sunday   may wash over wound with mild soap and water      No dressing needed      CAROTID Sugery: Call MD for difficulty swallowing or speaking; weakness in arms or legs that is a new symtom; severe headache.  If you have increased swelling in the neck and/or  are having difficulty breathing, CALL 911         [place], [place]  Home Medication Instructions [id number]   Printed on:05/02/12 1453  Medication Information                    PARoxetine (PAXIL) 30 MG tablet Take 60 mg by mouth every morning.            clopidogrel (PLAVIX) 75 MG tablet Take 75 mg by mouth daily.           aspirin EC 325 MG tablet Take 325 mg by mouth daily.           lisinopril (PRINIVIL,ZESTRIL) 20 MG tablet Take 20 mg by mouth daily.           pantoprazole (PROTONIX) 40 MG tablet Take 40 mg by mouth daily.           methylphenidate (CONCERTA) 36 MG CR tablet Take 36 mg by mouth every morning.           pravastatin (PRAVACHOL) 40 MG tablet Take 40 mg by mouth 2 (two) times daily.  pentazocine-naloxone  (TALWIN NX) 50-0.5 MG per tablet Take 1 tablet by mouth every 4 (four) hours as needed for pain. For pain, 2 pills every 4 hours            Verbal and written Discharge instructions given to the patient. Wound care per Discharge AVS Follow-up Information    Follow up with [name], MD in 2 weeks. (office will arrange -sent)    Contact information:   [address] [place] [address] [phone]          Signed:Hank Walling [facility] PA-C 05/02/2012, 2:53 PM

## 2012-05-02 NOTE — Op Note (Signed)
OPERATIVE REPORT  Date of Surgery: 05/02/2012  Surgeon: [name], MD  Assistant: Della Goo -pa Pre-op Diagnosis: LEFT SUBCLAVIAN OCCLUSION WITH STEAL syndrome Post-op Diagnosis: LEFT SUBCLAVIAN OCCLUSION WITH STEAL syndrome Procedure: Procedure(s): Left carotid-subclavian anastomosis Left subclavian endarterectomy  Anesthesia: General  EBL:  50 cc  Complications: None  Procedure Details: Patient was taken to the operating room placed in the supine position at which time satisfactory general endotracheal anesthesia was administered. Left neck was prepped with Betadine scrub and solution draped in routine sterile manner. Transverse incision was made the base of the neck beginning at the sternal notch extending laterally about 6 cm. This is carried down to subcutaneous tissue and platysma with the Bovie. The sternal and a portion of the clavicular head of the sternocleidomastoid muscle was divided. The carotid sheath was exposed. Common carotid artery was dissected free it had an excellent pulse and was free of disease. The internal jugular vein and vagus nerves were dissected free gently reflected laterally. The thoracic duct was identified ligated with 2-0 silk ties and divided. The vertebral veins and ligated with 3-0 silk ties and divided exposing the subclavian artery and the base of the neck. Subclavian artery was disease up to the origin of the vertebral but was soft distally. It was totally occluded proximally by angiography. Vertebral artery was a large vessel. This was all mobilized the patient was heparinized. Subclavian artery was occluded proximally with a vascular clamp and distal to the vertebral and vertebral occluded with a vascular bulldog clamp. Subclavian transected 2 cm distal to the origin of vertebral proximal subclavian with slightly endarterectomy is oversewn with 2 layers of 5-0 Prolene allowed to retract but no bleeding. There was a plaque which extended into the  origin of the vertebral terminated at this point. Localized endarterectomy was performed in this plaque came out nicely with a widely patent distal vessel an excellent back bleeding from the vertebral. Site for transposition and anastomosis of the subclavian artery to the posterior aspect of the common carotid was selected. This was marked, and carotid occluded proximally and distally of 11 blade large 5 mm punch. The subclavian was anastomosed end to side the common carotid with 6-0 Prolene. Following appropriate antegrade and retrograde flushing this was completed the Vesseloops and clamps released there was excellent pulse and excellent Doppler flow in all vessels. Protamine was given to reverse the heparin following adequate hemostasis the wound closed in layers with Vicryl in subcuticular fashion sterile dressing applied patient taken to the recovery room in stable condition  [name], MD 05/02/2012 10:02 AM

## 2012-05-02 NOTE — Progress Notes (Signed)
BP IN RIGHT ARM 118/55. BP IN LEFT ARM 87/39.

## 2012-05-02 NOTE — Progress Notes (Signed)
DR.LAWSON TO BEDSIDE AND AWARE OF DIFFERENCE OF BP BETWEEN ARMS

## 2012-05-02 NOTE — Anesthesia Postprocedure Evaluation (Signed)
  Anesthesia Post-op Note  Patient: Jocelyn Curtis  Procedure(s) Performed: Procedure(s) (LRB): BYPASS GRAFT CAROTID-SUBCLAVIAN (Left) ENDARTERECTOMY SUBCLAVIAN (Left)  Patient Location: PACU  Anesthesia Type: General  Level of Consciousness: awake, alert , oriented and patient cooperative  Airway and Oxygen Therapy: Patient Spontanous Breathing and Patient connected to nasal cannula oxygen  Post-op Pain: mild  Post-op Assessment: Post-op Vital signs reviewed, Patient's Cardiovascular Status Stable, Respiratory Function Stable, Patent Airway, No signs of Nausea or vomiting and Pain level controlled  Post-op Vital Signs: stable  Complications: No apparent anesthesia complications

## 2012-05-02 NOTE — H&P (View-Only) (Signed)
Subjective:     Patient ID: Jocelyn Curtis, female   DOB: [birth year], 66 y.o.   MRN: [id number]  HPI this 66 year old female was referred for subclavian steal syndrome. This patient has been having episodes of dizziness and visual changes with blurriness in both eyes left worse than right over the last several months. 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 2 attempts were made to treat this with stenting by Dr.Devesshwar in April and May of this year and both were unsuccessful. One was by the femoral route and once by the left brachial approach. She continues to have symptoms. She occasionally will have difficulty with her speech-not slurred but unable to come up with proper words. She denies any history of coronary artery disease.  Past Medical History  Diagnosis Date  . Hyperlipidemia     takes Zocor bid  . Anxiety and depression   . Osteoarthritis (arthritis due to wear and tear of joints)   . Abdominal aortic aneurysm without rupture     followed by Dr. Aggie Cosier  . Vision loss, bilateral   . Dizziness - giddy   . Cerebrovascular disease     pt states she pulls to the left and staggers alot  . Subclavian steal syndrome   . Coronary artery disease   . H/O blood clots     in right foot-04/24/11  . COPD (chronic obstructive pulmonary disease)   . Foot drop     right since knee replacement  . Chronic back pain   . Bruises easily   . GERD (gastroesophageal reflux disease)     pt doesn't take any meds for this just monitors what she eat  . H/O: GI bleed   . Blood transfusion 20yrs ago  . Diabetes mellitus     but controlled with diet and exercise  . Peripheral vascular disease     blood clot in foot following epidural anesthesia - 04/2011  . Shortness of breath     fr. exertion   . Arthritis     "all over body"  . Osteoarthritis   . Hypertension     takes  Lisinopril daily, Dr. Donnal Moat. took  care of pt. before his retirement   . Complication of anesthesia     ENDED WITH BLOOD CLOTT IN LEG  . Stroke     ministrokes many yrs. ago   . Dysrhythmia     IRREG HEARTBEATS  . CHF (congestive heart failure)     USED TO SEE DR GROVE  . Pneumonia     hx of;last time about 71yrs ago 2008  . Renal insufficiency     SEE MED DR Shelah Lewandowsky [place]  . Cancer     HX SKIN CA  . Depression     takes Paxil daily  . Subclavian steal syndrome   . Carotid artery occlusion   . AAA (abdominal aortic aneurysm)     History  Substance Use Topics  . Smoking status: Former Smoker -- 0.2 packs/day for 48 years    Quit date: 02/14/2012  . Smokeless tobacco: Current User    Types: Snuff   Comment: uses snuff every once in a while  . Alcohol Use: No     occ beer    Family History  Problem Relation Age of Onset  . Anesthesia problems Neg Hx   . Hypotension Neg Hx   . Malignant hyperthermia Neg Hx   .  Pseudochol deficiency Neg Hx   . Diabetes Mother   . Heart disease Mother     before age 5  . Hyperlipidemia Mother   . Hypertension Mother   . Heart attack Mother   . Cancer Father   . Heart disease Father     before age 48  . Hyperlipidemia Father   . Hypertension Father   . Heart attack Father   . Deep vein thrombosis Sister   . Diabetes Sister   . Heart disease Sister   . Hyperlipidemia Sister   . Hypertension Sister   . Heart attack Sister   . Diabetes Brother   . Hypertension Brother   . Diabetes Daughter     Allergies  Allergen Reactions  . Codeine Nausea And Vomiting  . Nsaids Other (See Comments)    Gi bleed  . Penicillins Rash    Itching Abdominal swelling    Current outpatient prescriptions:aspirin EC 325 MG tablet, Take 325 mg by mouth daily., Disp: , Rfl: ;  clopidogrel (PLAVIX) 75 MG tablet, Take 75 mg by mouth daily., Disp: , Rfl: ;  lisinopril (PRINIVIL,ZESTRIL) 20 MG tablet, Take 20 mg by mouth daily., Disp: , Rfl: ;  pantoprazole (PROTONIX) 40 MG  tablet, Take 40 mg by mouth daily., Disp: , Rfl: ;  PARoxetine (PAXIL) 30 MG tablet, Take 60 mg by mouth every morning. , Disp: , Rfl:  pentazocine-naloxone (TALWIN NX) 50-0.5 MG per tablet, Take 1 tablet by mouth. For pain, 2 pills every 4 hours, Disp: , Rfl: ;  pravastatin (PRAVACHOL) 40 MG tablet, Take 40 mg by mouth 2 (two) times daily., Disp: , Rfl: ;  methylphenidate (CONCERTA) 36 MG CR tablet, Take 36 mg by mouth every morning., Disp: , Rfl:   BP 134/75  Pulse 97  Temp 98.3 F (36.8 C) (Oral)  Ht 5' 8.5" (1.74 m)  Wt 206 lb (93.441 kg)  BMI 30.87 kg/m2  SpO2 100%  Body mass index is 30.87 kg/(m^2).          Review of Systems has mild chest aching occasionally but does have gastroesophageal reflux disease. Denies claudication, dyspnea on exertion, PND, orthopnea.     Objective:   Physical Exam blood pressure 134/75 heart rate 97 respirations 14 Gen.-alert and oriented x3 in no apparent distress HEENT normal for age Lungs no rhonchi or wheezing Cardiovascular regular rhythm no murmurs carotid pulses 3+ palpable no bruits audible Abdomen soft nontender no palpable masses Musculoskeletal free of  major deformities Skin clear -no rashes Neurologic normal Lower extremities 3+ femoral and dorsalis pedis pulses palpable bilaterally with no edema Upper extremities with 3+ radial pulse on the right 2+ on the left.  I have reviewed the clinical records provided by Dr. Jeannetta Nap and the angiograms performed . I agree that patient has total occlusion left subclavian artery with no significant carotid occlusive disease.      Assessment:     Left subclavian steal syndrome with 2 unsuccessful attempts at percutaneous intervention-total occlusion left subclavian artery with retrograde flow in left vertebral artery    Plan:     #1 nuclear medicine stress test to rule out coronary artery disease #2 plan left subclavian carotid anastomosis on Friday, August 2. Risks and benefits  including possible CVA were discussed with patient and she would like to proceed

## 2012-05-02 NOTE — Interval H&P Note (Signed)
History and Physical Interval Note:  05/02/2012 7:28 AM  Jocelyn Curtis  has presented today for surgery, with the diagnosis of LEFT SUBCLAVIAN OCCLUSION WITH STEAL  The various methods of treatment have been discussed with the patient and family. After consideration of risks, benefits and other options for treatment, the patient has consented to  Procedure(s) (LRB): BYPASS GRAFT CAROTID-SUBCLAVIAN (Left) as a surgical intervention .  The patient's history has been reviewed, patient examined, no change in status, stable for surgery.  I have reviewed the patient's chart and labs.  Questions were answered to the patient's satisfaction.     [name]

## 2012-05-02 NOTE — Progress Notes (Signed)
Utilization review completed.  

## 2012-05-02 NOTE — Telephone Encounter (Signed)
Patient # disconnected- sent letter re: follow up, dpm

## 2012-05-02 NOTE — Transfer of Care (Signed)
Immediate Anesthesia Transfer of Care Note  Patient: Jocelyn Curtis  Procedure(s) Performed: Procedure(s) (LRB): BYPASS GRAFT CAROTID-SUBCLAVIAN (Left) ENDARTERECTOMY SUBCLAVIAN (Left)  Patient Location: PACU  Anesthesia Type: General  Level of Consciousness: awake, alert  and oriented  Airway & Oxygen Therapy: Patient Spontanous Breathing and Patient connected to face mask oxygen  Post-op Assessment: Report given to PACU RN and Post -op Vital signs reviewed and stable  Post vital signs: Reviewed and stable  Complications: No apparent anesthesia complications

## 2012-05-02 NOTE — Telephone Encounter (Signed)
Message copied by Fredrich Birks on Fri May 02, 2012 12:30 PM ------      Message from: Melene Plan      Created: Fri May 02, 2012 12:08 PM                   ----- Message -----         From: Marlowe Shores, PA         Sent: 05/02/2012   9:48 AM           To: Melene Plan, RN            2 week F/U carotid subclavian bypass - Hart Rochester

## 2012-05-02 NOTE — Preoperative (Signed)
Beta Blockers   Reason not to administer Beta Blockers:Not Applicable 

## 2012-05-03 LAB — CBC
Hemoglobin: 15.1 g/dL — ABNORMAL HIGH (ref 12.0–15.0)
MCHC: 33.6 g/dL (ref 30.0–36.0)
RDW: 14.1 % (ref 11.5–15.5)

## 2012-05-03 LAB — BASIC METABOLIC PANEL
GFR calc Af Amer: 72 mL/min — ABNORMAL LOW (ref >90)
GFR calc non Af Amer: 62 mL/min — ABNORMAL LOW (ref >90)
Potassium: 4.3 mEq/L (ref 3.5–5.1)
Sodium: 141 mEq/L (ref 135–145)

## 2012-05-03 NOTE — Progress Notes (Signed)
VASCULAR AND VEIN SURGERY POST - OP [facility] PROGRESS NOTE  Date of Surgery: 05/02/2012 Surgeon: Surgeon(s): Pryor Ochoa, MD 1 Day Post-Op left Carotid/subclavion Endarterectomy .  HPI: Jocelyn Curtis is a 66 y.o. female who is 1 Day Post-Op left Carotid Endarterectomy . Patient is doing well. Pre-operative symptoms are Improved Patient denies headache; Patient denies difficulty swallowing; denies weakness in upper or lower extremities; Pt. denies other symptoms of stroke or TIA.  IMAGING: No results found.  Significant Diagnostic Studies: CBC Lab Results  Component Value Date   WBC 8.5 05/03/2012   HGB 15.1* 05/03/2012   HCT 45.0 05/03/2012   MCV 99.3 05/03/2012   PLT 166 05/03/2012    BMET    Component Value Date/Time   NA 141 05/03/2012 0415   K 4.3 05/03/2012 0415   CL 106 05/03/2012 0415   CO2 25 05/03/2012 0415   GLUCOSE 161* 05/03/2012 0415   BUN 18 05/03/2012 0415   CREATININE 0.94 05/03/2012 0415   CALCIUM 8.8 05/03/2012 0415   GFRNONAA 62* 05/03/2012 0415   GFRAA 72* 05/03/2012 0415    COAG Lab Results  Component Value Date   INR 0.91 04/29/2012   INR 0.91 01/25/2012   INR 0.90 12/27/2011   No results found for this basename: PTT      Intake/Output Summary (Last 24 hours) at 05/03/12 0825 Last data filed at 05/03/12 0800  Gross per 24 hour  Intake   1450 ml  Output   1450 ml  Net      0 ml    Physical Exam:  BP Readings from Last 3 Encounters:  05/03/12 142/89  05/03/12 142/89  04/29/12 122/77   Temp Readings from Last 3 Encounters:  05/03/12 98.2 F (36.8 C) Oral  05/03/12 98.2 F (36.8 C) Oral  04/29/12 98.9 F (37.2 C)    SpO2 Readings from Last 3 Encounters:  05/03/12 93%  05/03/12 93%  04/29/12 96%   Pulse Readings from Last 3 Encounters:  05/03/12 100  05/03/12 100  04/29/12 86    Pt is A&O x 3 Speech is fluent left Neck Wound is clean, dry, intact or healing well Patient with Negative tongue deviation and Negative facial droop Pt has good and  equal strength in all extremities  Assessment: Jocelyn Curtis is a 66 y.o. female is S/P Left Carotid/subclavian endarterectomy Pt is voiding, ambulating and taking po well   Plan: Discharge to: Home Follow-up in 4 weeks   Clinton Gallant [facility] [phone] 05/03/2012 8:25 AM

## 2012-05-03 NOTE — Progress Notes (Signed)
Patient d/c'd home per MD order. Patient's VVS and assessment WNL; neuro intact AOx3, MAEx4, smile and grips equal, tongue mid-line.  Patient and family given d/c instructions/prescription and all questions answered. Patient d/c'd via wheelchair with NT and family.

## 2012-05-05 ENCOUNTER — Telehealth: Payer: Self-pay | Admitting: Vascular Surgery

## 2012-05-05 ENCOUNTER — Encounter (HOSPITAL_COMMUNITY): Payer: Self-pay | Admitting: Vascular Surgery

## 2012-05-05 NOTE — Telephone Encounter (Signed)
Per Dr. Candie Chroman staff message 05/05/12, tried to call patients daughter with appt information. Patients daughter did not answer or have a voicemail set up to leave a message. Appt letter has been mailed on 05/02/12

## 2012-05-05 NOTE — Telephone Encounter (Signed)
Message copied by Sara Chu on Mon May 05, 2012  3:51 PM ------      Message from: Phillips Odor      Created: Mon May 05, 2012 12:45 PM                   ----- Message -----         From: Lars Mage, PA         Sent: 05/03/2012   8:30 AM           To: Melene Plan, RN            F/U 2 weeks with Dr. Hart Rochester her daughter's no. Is [phone] to call for follow up appt. Thanks mc

## 2012-05-19 ENCOUNTER — Encounter: Payer: Self-pay | Admitting: Vascular Surgery

## 2012-05-20 ENCOUNTER — Encounter: Payer: Self-pay | Admitting: Vascular Surgery

## 2012-05-20 ENCOUNTER — Ambulatory Visit (INDEPENDENT_AMBULATORY_CARE_PROVIDER_SITE_OTHER): Payer: Medicare PPO | Admitting: Vascular Surgery

## 2012-05-20 VITALS — BP 141/77 | HR 76 | Resp 16 | Ht 68.5 in | Wt 203.0 lb

## 2012-05-20 DIAGNOSIS — Z48812 Encounter for surgical aftercare following surgery on the circulatory system: Secondary | ICD-10-CM

## 2012-05-20 DIAGNOSIS — G458 Other transient cerebral ischemic attacks and related syndromes: Secondary | ICD-10-CM

## 2012-05-20 NOTE — Progress Notes (Signed)
Subjective:     Patient ID: Jocelyn Curtis, female   DOB: 09-20-1946, 66 y.o.   MRN: [id number]  HPI this 66 year old female returns 2 weeks post left carotid subclavian anastomosis for subclavian steal syndrome. She was experiencing blurred vision and dizziness prior to the procedure. She states that her vision has now returned to a normal state and she has had no dizziness. Is very happy with the way she feels at the present time. She's had no difficulty swallowing or hoarseness.  Past Medical History  Diagnosis Date  . Hyperlipidemia     takes Pravastatin daily  . Anxiety and depression   . Osteoarthritis (arthritis due to wear and tear of joints)   . Abdominal aortic aneurysm without rupture     followed by Dr. Aggie Cosier  . Dizziness - giddy   . Cerebrovascular disease     pt states she pulls to the left and staggers alot  . Subclavian steal syndrome   . Coronary artery disease   . H/O blood clots     in right foot-04/24/11  . COPD (chronic obstructive pulmonary disease)   . Foot drop     right since knee replacement  . Chronic back pain   . Bruises easily   . H/O: GI bleed   . Blood transfusion 39yrs ago  . Diabetes mellitus     but controlled with diet and exercise  . Peripheral vascular disease     blood clot in foot following epidural anesthesia - 04/2011  . Arthritis     "all over body"  . Osteoarthritis   . Hypertension     takes  Lisinopril daily, Dr. Donnal Moat. took care of pt. before his retirement   . Complication of anesthesia     ENDED WITH BLOOD CLOTT IN LEG  . Stroke     ministrokes many yrs. ago   . Dysrhythmia     IRREG HEARTBEATS  . CHF (congestive heart failure)     USED TO SEE DR GROVE  . Pneumonia     hx of;last time about 54yrs ago 2008  . Renal insufficiency     SEE MED DR Shelah Lewandowsky [place]  . Cancer     HX SKIN CA  . Depression     takes Paxil daily  . Subclavian steal syndrome   . Carotid artery occlusion   . AAA (abdominal aortic  aneurysm)   . Shortness of breath     with exertion  . Dizziness   . Vision loss     left eye  . GERD (gastroesophageal reflux disease)     takes Protonix daily  . History of bladder infections     History  Substance Use Topics  . Smoking status: Former Smoker -- 0.2 packs/day for 48 years    Quit date: 02/14/2012  . Smokeless tobacco: Current User    Types: Snuff   Comment: uses snuff every once in a while  . Alcohol Use: No     occ beer    Family History  Problem Relation Age of Onset  . Anesthesia problems Neg Hx   . Hypotension Neg Hx   . Malignant hyperthermia Neg Hx   . Pseudochol deficiency Neg Hx   . Diabetes Mother   . Heart disease Mother     before age 45  . Hyperlipidemia Mother   . Hypertension Mother   . Heart attack Mother   . Cancer Father   . Heart disease Father  before age 20  . Hyperlipidemia Father   . Hypertension Father   . Heart attack Father   . Deep vein thrombosis Sister   . Diabetes Sister   . Heart disease Sister   . Hyperlipidemia Sister   . Hypertension Sister   . Heart attack Sister   . Diabetes Brother   . Hypertension Brother   . Diabetes Daughter     Allergies  Allergen Reactions  . Codeine Nausea And Vomiting  . Nsaids Other (See Comments)    Gi bleed  . Penicillins Rash    Itching Abdominal swelling    Current outpatient prescriptions:aspirin EC 325 MG tablet, Take 325 mg by mouth daily., Disp: , Rfl: ;  clopidogrel (PLAVIX) 75 MG tablet, Take 75 mg by mouth daily., Disp: , Rfl: ;  lisinopril (PRINIVIL,ZESTRIL) 20 MG tablet, Take 20 mg by mouth daily., Disp: , Rfl: ;  methylphenidate (CONCERTA) 36 MG CR tablet, Take 36 mg by mouth every morning., Disp: , Rfl: ;  pantoprazole (PROTONIX) 40 MG tablet, Take 40 mg by mouth daily., Disp: , Rfl:  PARoxetine (PAXIL) 30 MG tablet, Take 60 mg by mouth every morning. , Disp: , Rfl: ;  pentazocine-naloxone (TALWIN NX) 50-0.5 MG per tablet, Take 1 tablet by mouth every 4 (four)  hours as needed for pain. For pain, 2 pills every 4 hours, Disp: 20 tablet, Rfl: 0;  pravastatin (PRAVACHOL) 40 MG tablet, Take 40 mg by mouth 2 (two) times daily., Disp: , Rfl:   BP 154/79  Pulse 81  Resp 16  Ht 5' 8.5" (1.74 m)  Wt 203 lb (92.08 kg)  BMI 30.42 kg/m2  Body mass index is 30.42 kg/(m^2).          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

## 2012-05-20 NOTE — Addendum Note (Signed)
Addended by: Sharee Pimple on: 05/20/2012 03:56 PM   Modules accepted: Orders

## 2012-05-21 ENCOUNTER — Other Ambulatory Visit (HOSPITAL_COMMUNITY): Payer: Self-pay | Admitting: Family Medicine

## 2012-05-21 DIAGNOSIS — Z1231 Encounter for screening mammogram for malignant neoplasm of breast: Secondary | ICD-10-CM

## 2012-05-21 DIAGNOSIS — Z780 Asymptomatic menopausal state: Secondary | ICD-10-CM

## 2012-06-06 ENCOUNTER — Ambulatory Visit (HOSPITAL_COMMUNITY)
Admission: RE | Admit: 2012-06-06 | Discharge: 2012-06-06 | Disposition: A | Discharge status: Home / Self Care | Payer: Medicare PPO | Source: Ambulatory Visit | Attending: Family Medicine | Admitting: Family Medicine

## 2012-06-06 DIAGNOSIS — Z1231 Encounter for screening mammogram for malignant neoplasm of breast: Secondary | ICD-10-CM

## 2012-06-06 DIAGNOSIS — Z780 Asymptomatic menopausal state: Secondary | ICD-10-CM

## 2012-06-10 ENCOUNTER — Other Ambulatory Visit: Payer: Self-pay | Admitting: Family Medicine

## 2012-06-10 DIAGNOSIS — R928 Other abnormal and inconclusive findings on diagnostic imaging of breast: Secondary | ICD-10-CM

## 2012-06-16 ENCOUNTER — Ambulatory Visit
Admission: RE | Admit: 2012-06-16 | Discharge: 2012-06-16 | Disposition: A | Discharge status: Home / Self Care | Payer: Medicare PPO | Source: Ambulatory Visit | Attending: Family Medicine | Admitting: Family Medicine

## 2012-06-16 DIAGNOSIS — R928 Other abnormal and inconclusive findings on diagnostic imaging of breast: Secondary | ICD-10-CM

## 2012-08-17 ENCOUNTER — Emergency Department (HOSPITAL_COMMUNITY)
Admission: EM | Admit: 2012-08-17 | Discharge: 2012-08-17 | Disposition: A | Discharge status: Home / Self Care | Payer: Medicare PPO | Attending: Emergency Medicine | Admitting: Emergency Medicine

## 2012-08-17 ENCOUNTER — Encounter (HOSPITAL_COMMUNITY): Payer: Self-pay | Admitting: Emergency Medicine

## 2012-08-17 DIAGNOSIS — M545 Low back pain, unspecified: Secondary | ICD-10-CM | POA: Insufficient documentation

## 2012-08-17 DIAGNOSIS — G8929 Other chronic pain: Secondary | ICD-10-CM | POA: Insufficient documentation

## 2012-08-17 DIAGNOSIS — H5460 Unqualified visual loss, one eye, unspecified: Secondary | ICD-10-CM | POA: Insufficient documentation

## 2012-08-17 DIAGNOSIS — I10 Essential (primary) hypertension: Secondary | ICD-10-CM | POA: Insufficient documentation

## 2012-08-17 DIAGNOSIS — J4489 Other specified chronic obstructive pulmonary disease: Secondary | ICD-10-CM | POA: Insufficient documentation

## 2012-08-17 DIAGNOSIS — I714 Abdominal aortic aneurysm, without rupture, unspecified: Secondary | ICD-10-CM | POA: Insufficient documentation

## 2012-08-17 DIAGNOSIS — F17200 Nicotine dependence, unspecified, uncomplicated: Secondary | ICD-10-CM | POA: Insufficient documentation

## 2012-08-17 DIAGNOSIS — Z7982 Long term (current) use of aspirin: Secondary | ICD-10-CM | POA: Insufficient documentation

## 2012-08-17 DIAGNOSIS — Z8673 Personal history of transient ischemic attack (TIA), and cerebral infarction without residual deficits: Secondary | ICD-10-CM | POA: Insufficient documentation

## 2012-08-17 DIAGNOSIS — E785 Hyperlipidemia, unspecified: Secondary | ICD-10-CM | POA: Insufficient documentation

## 2012-08-17 DIAGNOSIS — F411 Generalized anxiety disorder: Secondary | ICD-10-CM | POA: Insufficient documentation

## 2012-08-17 DIAGNOSIS — I509 Heart failure, unspecified: Secondary | ICD-10-CM | POA: Insufficient documentation

## 2012-08-17 DIAGNOSIS — F329 Major depressive disorder, single episode, unspecified: Secondary | ICD-10-CM | POA: Insufficient documentation

## 2012-08-17 DIAGNOSIS — I739 Peripheral vascular disease, unspecified: Secondary | ICD-10-CM | POA: Insufficient documentation

## 2012-08-17 DIAGNOSIS — Z8679 Personal history of other diseases of the circulatory system: Secondary | ICD-10-CM | POA: Insufficient documentation

## 2012-08-17 DIAGNOSIS — J449 Chronic obstructive pulmonary disease, unspecified: Secondary | ICD-10-CM | POA: Insufficient documentation

## 2012-08-17 DIAGNOSIS — I6529 Occlusion and stenosis of unspecified carotid artery: Secondary | ICD-10-CM | POA: Insufficient documentation

## 2012-08-17 DIAGNOSIS — Z7902 Long term (current) use of antithrombotics/antiplatelets: Secondary | ICD-10-CM | POA: Insufficient documentation

## 2012-08-17 DIAGNOSIS — Z87448 Personal history of other diseases of urinary system: Secondary | ICD-10-CM | POA: Insufficient documentation

## 2012-08-17 DIAGNOSIS — E119 Type 2 diabetes mellitus without complications: Secondary | ICD-10-CM | POA: Insufficient documentation

## 2012-08-17 DIAGNOSIS — Z8701 Personal history of pneumonia (recurrent): Secondary | ICD-10-CM | POA: Insufficient documentation

## 2012-08-17 DIAGNOSIS — K219 Gastro-esophageal reflux disease without esophagitis: Secondary | ICD-10-CM | POA: Insufficient documentation

## 2012-08-17 DIAGNOSIS — G458 Other transient cerebral ischemic attacks and related syndromes: Secondary | ICD-10-CM | POA: Insufficient documentation

## 2012-08-17 DIAGNOSIS — Z79899 Other long term (current) drug therapy: Secondary | ICD-10-CM | POA: Insufficient documentation

## 2012-08-17 DIAGNOSIS — F3289 Other specified depressive episodes: Secondary | ICD-10-CM | POA: Insufficient documentation

## 2012-08-17 DIAGNOSIS — Z8719 Personal history of other diseases of the digestive system: Secondary | ICD-10-CM | POA: Insufficient documentation

## 2012-08-17 MED ORDER — HYDROMORPHONE HCL PF 1 MG/ML IJ SOLN
1.0000 mg | Freq: Once | INTRAMUSCULAR | Status: AC
  Administered 2012-08-17: 1 mg via INTRAMUSCULAR
  Filled 2012-08-17: qty 1

## 2012-08-17 MED ORDER — DIAZEPAM 5 MG PO TABS
5.0000 mg | ORAL_TABLET | Freq: Once | ORAL | Status: AC
  Administered 2012-08-17: 5 mg via ORAL
  Filled 2012-08-17: qty 1

## 2012-08-17 MED ORDER — DIAZEPAM 5 MG PO TABS: 5.0000 mg | ORAL_TABLET | Freq: Three times a day (TID) | ORAL | Status: DC | PRN

## 2012-08-17 MED ORDER — OXYCODONE-ACETAMINOPHEN 5-325 MG PO TABS: 1.0000 | ORAL_TABLET | ORAL | Status: DC | PRN

## 2012-08-17 NOTE — ED Provider Notes (Signed)
History  This chart was scribed for Raeford Razor, MD by Ladona Ridgel Day, ED scribe. This patient was seen in room TR10C/TR10C and the patient's care was started at 1739.   CSN: [id number]  Arrival date & time 08/17/12  1739   First MD Initiated Contact with Patient 08/17/12 1833      Chief Complaint  Patient presents with  . Back Pain    low left side   Patient is a 66 y.o. female presenting with back pain. The history is provided by the patient. No language interpreter was used.  Back Pain  This is a chronic problem. The current episode started yesterday. The problem occurs constantly. The problem has not changed since onset.The pain is associated with falling (fell yesterday AM). The pain is present in the lumbar spine. The quality of the pain is described as aching. The pain radiates to the left thigh. The pain is moderate. The symptoms are aggravated by certain positions. The pain is the same all the time. Pertinent negatives include no chest pain, no fever, no abdominal pain, no dysuria and no weakness.  Jocelyn Curtis is a 66 y.o. female who presents to the Emergency Department w/hx of back problems complaining of left lower back pain since yesterday after she fell yesterday morning. She denies LOC. No previous back surgeries, she takes plavix. Denies fevers/chills  Allergic to PCN and codeine, gets itching   Past Medical History  Diagnosis Date  . Hyperlipidemia     takes Pravastatin daily  . Anxiety and depression   . Osteoarthritis (arthritis due to wear and tear of joints)   . Abdominal aortic aneurysm without rupture     followed by Dr. Aggie Cosier  . Dizziness - giddy   . Cerebrovascular disease     pt states she pulls to the left and staggers alot  . Subclavian steal syndrome   . Coronary artery disease   . H/O blood clots     in right foot-04/24/11  . COPD (chronic obstructive pulmonary disease)   . Foot drop     right since knee replacement  . Chronic back pain   .  Bruises easily   . H/O: GI bleed   . Blood transfusion 48yrs ago  . Diabetes mellitus     but controlled with diet and exercise  . Peripheral vascular disease     blood clot in foot following epidural anesthesia - 04/2011  . Arthritis     "all over body"  . Osteoarthritis   . Hypertension     takes  Lisinopril daily, Dr. Donnal Moat. took care of pt. before his retirement   . Complication of anesthesia     ENDED WITH BLOOD CLOTT IN LEG  . Stroke     ministrokes many yrs. ago   . Dysrhythmia     IRREG HEARTBEATS  . CHF (congestive heart failure)     USED TO SEE DR GROVE  . Pneumonia     hx of;last time about 63yrs ago 2008  . Renal insufficiency     SEE MED DR Shelah Lewandowsky [place]  . Cancer     HX SKIN CA  . Depression     takes Paxil daily  . Subclavian steal syndrome   . Carotid artery occlusion   . AAA (abdominal aortic aneurysm)   . Shortness of breath     with exertion  . Dizziness   . Vision loss     left eye  . GERD (gastroesophageal  reflux disease)     takes Protonix daily  . History of bladder infections     Past Surgical History  Procedure Date  . Rotator cuff repair     bilateral  . Appendectomy   . Vaginal hysterectomy   . Hand surgery     right  . Perineal abscess i & d     necrotizing soft tissue infection 2005  . Nasal sinus surgery   . Carpal tunnel release   . Foot surgery     left  . Cosmetic surgery   . Cardiac catheterization   . Joint replacement     bilateral total knee replacement  . Carotid-subclavian bypass graft 05/02/2012    Procedure: BYPASS GRAFT CAROTID-SUBCLAVIAN;  Surgeon: Pryor Ochoa, MD;  Location: [facility] OR;  Service: Vascular;  Laterality: Left;  . Endarterectomy 05/02/2012    Procedure: ENDARTERECTOMY SUBCLAVIAN;  Surgeon: Pryor Ochoa, MD;  Location: [facility] OR;  Service: Vascular;  Laterality: Left;    Family History  Problem Relation Age of Onset  . Anesthesia problems Neg Hx   . Hypotension Neg Hx   . Malignant  hyperthermia Neg Hx   . Pseudochol deficiency Neg Hx   . Diabetes Mother   . Heart disease Mother     before age 40  . Hyperlipidemia Mother   . Hypertension Mother   . Heart attack Mother   . Cancer Father   . Heart disease Father     before age 47  . Hyperlipidemia Father   . Hypertension Father   . Heart attack Father   . Deep vein thrombosis Sister   . Diabetes Sister   . Heart disease Sister   . Hyperlipidemia Sister   . Hypertension Sister   . Heart attack Sister   . Diabetes Brother   . Hypertension Brother   . Diabetes Daughter     History  Substance Use Topics  . Smoking status: Current Some Day Smoker -- 0.2 packs/day for 48 years  . Smokeless tobacco: Current User    Types: Snuff     Comment: uses snuff every once in a while  . Alcohol Use: No     Comment: occ beer    OB History    Grav Para Term Preterm Abortions TAB SAB Ect Mult Living                  Review of Systems  Constitutional: Negative for fever and chills.  Respiratory: Negative for shortness of breath.   Cardiovascular: Negative for chest pain.  Gastrointestinal: Negative for nausea, vomiting and abdominal pain.  Genitourinary: Negative for dysuria and difficulty urinating.  Musculoskeletal: Positive for back pain (left lower lumbar back pain ).  Neurological: Negative for weakness.  All other systems reviewed and are negative.    Allergies  Codeine; Nsaids; and Penicillins  Home Medications   Current Outpatient Rx  Name  Route  Sig  Dispense  Refill  . ASPIRIN EC 325 MG PO TBEC   Oral   Take 325 mg by mouth daily.         Marland Kitchen CLOPIDOGREL BISULFATE 75 MG PO TABS   Oral   Take 75 mg by mouth daily.         Marland Kitchen LISINOPRIL 20 MG PO TABS   Oral   Take 20 mg by mouth daily.         Marland Kitchen PANTOPRAZOLE SODIUM 40 MG PO TBEC   Oral   Take 40 mg by  mouth daily.         Marland Kitchen PAROXETINE HCL 30 MG PO TABS   Oral   Take 60 mg by mouth every morning.          Marland Kitchen  PENTAZOCINE-NALOXONE HCL 50-0.5 MG PO TABS   Oral   Take 1 tablet by mouth every 4 (four) hours as needed for pain. For pain, 2 pills every 4 hours   20 tablet   0   . PRAVASTATIN SODIUM 40 MG PO TABS   Oral   Take 80 mg by mouth daily.            Triage vitals: BP 130/77  Pulse 98  Temp 98.1 F (36.7 C) (Oral)  Resp 18  SpO2 98%  Physical Exam  Nursing note and vitals reviewed. Constitutional: She appears well-developed and well-nourished. No distress.  HENT:  Head: Normocephalic and atraumatic.  Eyes: Conjunctivae normal are normal. Right eye exhibits no discharge. Left eye exhibits no discharge.  Neck: Neck supple.  Cardiovascular: Normal rate, regular rhythm and normal heart sounds.  Exam reveals no gallop and no friction rub.   No murmur heard. Pulmonary/Chest: Effort normal and breath sounds normal. No respiratory distress.  Abdominal: Soft. She exhibits no distension. There is no tenderness.  Musculoskeletal: She exhibits tenderness. She exhibits no edema.       Moderate left lower lumbar back tenderness. No concerning overlying skin changes aside from multiple skin tattoos.   Neurological: She is alert.       5/5 motor strength BLE  Skin: Skin is warm and dry.  Psychiatric: She has a normal mood and affect. Her behavior is normal. Thought content normal.    ED Course  Procedures (including critical care time) DIAGNOSTIC STUDIES: Oxygen Saturation is 98% on room air, normal by my interpretation.    COORDINATION OF CARE: At 650 PM Discussed treatment plan with patient which includes pain medicine. Patient agrees.   Labs Reviewed - No data to display No results found.   1. Lower back pain       MDM  66yf with back pain.  Non focal neuro exam. No blood thinning medications. No bladder/bowel incontinence or retention. Denies hx of IV drug use. Doubt cauda equina, spinal epidural abscess, spinal epidural hematoma, fracture, vertebral osteomyelitis/discitis  or other potential emergent etiology. No indication for emergent imaging.  Plan symptomatic tx. Return precautions discussed. Outpt FU otherwise.   I personally preformed the services scribed in my presence. The recorded information has been reviewed and is accurate. Raeford Razor, MD.          Raeford Razor, MD 08/21/12 [phone]

## 2012-08-17 NOTE — ED Notes (Signed)
Pt c/o left low back pain radiating down left leg onset last night. Pt reports has history of chronic back pain. Pt fell yesterday morning while ambulating.

## 2012-09-14 ENCOUNTER — Encounter (HOSPITAL_COMMUNITY): Payer: Self-pay | Admitting: Emergency Medicine

## 2012-09-14 ENCOUNTER — Inpatient Hospital Stay (HOSPITAL_COMMUNITY)
Admission: EM | Admit: 2012-09-14 | Discharge: 2012-09-18 | DRG: 069 | Disposition: A | Discharge status: Home / Self Care | Payer: Medicare PPO | Attending: Internal Medicine | Admitting: Internal Medicine

## 2012-09-14 ENCOUNTER — Emergency Department (HOSPITAL_COMMUNITY): Payer: Medicare PPO

## 2012-09-14 DIAGNOSIS — G9340 Encephalopathy, unspecified: Secondary | ICD-10-CM | POA: Diagnosis present

## 2012-09-14 DIAGNOSIS — J449 Chronic obstructive pulmonary disease, unspecified: Secondary | ICD-10-CM | POA: Diagnosis present

## 2012-09-14 DIAGNOSIS — F411 Generalized anxiety disorder: Secondary | ICD-10-CM | POA: Diagnosis present

## 2012-09-14 DIAGNOSIS — G459 Transient cerebral ischemic attack, unspecified: Principal | ICD-10-CM | POA: Diagnosis present

## 2012-09-14 DIAGNOSIS — Z79899 Other long term (current) drug therapy: Secondary | ICD-10-CM

## 2012-09-14 DIAGNOSIS — I509 Heart failure, unspecified: Secondary | ICD-10-CM | POA: Diagnosis present

## 2012-09-14 DIAGNOSIS — E785 Hyperlipidemia, unspecified: Secondary | ICD-10-CM | POA: Diagnosis present

## 2012-09-14 DIAGNOSIS — Z85828 Personal history of other malignant neoplasm of skin: Secondary | ICD-10-CM

## 2012-09-14 DIAGNOSIS — I959 Hypotension, unspecified: Secondary | ICD-10-CM | POA: Diagnosis present

## 2012-09-14 DIAGNOSIS — I2510 Atherosclerotic heart disease of native coronary artery without angina pectoris: Secondary | ICD-10-CM | POA: Diagnosis present

## 2012-09-14 DIAGNOSIS — J4489 Other specified chronic obstructive pulmonary disease: Secondary | ICD-10-CM | POA: Diagnosis present

## 2012-09-14 DIAGNOSIS — T424X5A Adverse effect of benzodiazepines, initial encounter: Secondary | ICD-10-CM | POA: Diagnosis present

## 2012-09-14 DIAGNOSIS — M549 Dorsalgia, unspecified: Secondary | ICD-10-CM | POA: Diagnosis present

## 2012-09-14 DIAGNOSIS — R410 Disorientation, unspecified: Secondary | ICD-10-CM

## 2012-09-14 DIAGNOSIS — F3289 Other specified depressive episodes: Secondary | ICD-10-CM | POA: Diagnosis present

## 2012-09-14 DIAGNOSIS — Z7982 Long term (current) use of aspirin: Secondary | ICD-10-CM

## 2012-09-14 DIAGNOSIS — W19XXXA Unspecified fall, initial encounter: Secondary | ICD-10-CM

## 2012-09-14 DIAGNOSIS — R531 Weakness: Secondary | ICD-10-CM

## 2012-09-14 DIAGNOSIS — Y92009 Unspecified place in unspecified non-institutional (private) residence as the place of occurrence of the external cause: Secondary | ICD-10-CM

## 2012-09-14 DIAGNOSIS — R4182 Altered mental status, unspecified: Secondary | ICD-10-CM | POA: Diagnosis present

## 2012-09-14 DIAGNOSIS — M1990 Unspecified osteoarthritis, unspecified site: Secondary | ICD-10-CM | POA: Diagnosis present

## 2012-09-14 DIAGNOSIS — Z96659 Presence of unspecified artificial knee joint: Secondary | ICD-10-CM

## 2012-09-14 DIAGNOSIS — R5381 Other malaise: Secondary | ICD-10-CM | POA: Diagnosis present

## 2012-09-14 DIAGNOSIS — F329 Major depressive disorder, single episode, unspecified: Secondary | ICD-10-CM | POA: Diagnosis present

## 2012-09-14 DIAGNOSIS — Z8673 Personal history of transient ischemic attack (TIA), and cerebral infarction without residual deficits: Secondary | ICD-10-CM

## 2012-09-14 LAB — PROTIME-INR
INR: 0.96 (ref 0.00–1.49)
Prothrombin Time: 12.7 seconds (ref 11.6–15.2)

## 2012-09-14 LAB — URINALYSIS, ROUTINE W REFLEX MICROSCOPIC
Bilirubin Urine: NEGATIVE
Glucose, UA: NEGATIVE mg/dL
Specific Gravity, Urine: 1.019 (ref 1.005–1.030)

## 2012-09-14 LAB — COMPREHENSIVE METABOLIC PANEL
ALT: 9 U/L (ref 0–35)
Albumin: 4.1 g/dL (ref 3.5–5.2)
Alkaline Phosphatase: 86 U/L (ref 39–117)
BUN: 18 mg/dL (ref 6–23)
Chloride: 102 mEq/L (ref 96–112)
GFR calc Af Amer: 55 mL/min — ABNORMAL LOW (ref >90)
Glucose, Bld: 122 mg/dL — ABNORMAL HIGH (ref 70–99)
Potassium: 4.2 mEq/L (ref 3.5–5.1)
Sodium: 139 mEq/L (ref 135–145)
Total Bilirubin: 0.4 mg/dL (ref 0.3–1.2)
Total Protein: 7.7 g/dL (ref 6.0–8.3)

## 2012-09-14 LAB — URINE MICROSCOPIC-ADD ON

## 2012-09-14 LAB — CBC WITH DIFFERENTIAL/PLATELET
Basophils Absolute: 0 10*3/uL (ref 0.0–0.1)
Basophils Relative: 0 % (ref 0–1)
MCHC: 34.2 g/dL (ref 30.0–36.0)
Neutro Abs: 3 10*3/uL (ref 1.7–7.7)
Neutrophils Relative %: 40 % — ABNORMAL LOW (ref 43–77)
RDW: 13.9 % (ref 11.5–15.5)

## 2012-09-14 LAB — RAPID URINE DRUG SCREEN, HOSP PERFORMED
Cocaine: NOT DETECTED
Opiates: NOT DETECTED

## 2012-09-14 LAB — ACETAMINOPHEN LEVEL: Acetaminophen (Tylenol), Serum: <15 ug/mL (ref 10–30)

## 2012-09-14 MED ORDER — LORAZEPAM 2 MG/ML IJ SOLN
1.0000 mg | Freq: Once | INTRAMUSCULAR | Status: AC
  Administered 2012-09-14: 1 mg via INTRAVENOUS
  Filled 2012-09-14: qty 1

## 2012-09-14 MED ORDER — SODIUM CHLORIDE 0.9 % IV SOLN
INTRAVENOUS | Status: DC
  Administered 2012-09-14: 22:00:00 via INTRAVENOUS

## 2012-09-14 NOTE — ED Notes (Signed)
EDP advised that patient could have something to eat/ offered patient sandwich, crackers and drink / pt daughter is at the bedside

## 2012-09-14 NOTE — ED Notes (Signed)
Per EMS - pt coming from home. Family reports a week ago they noticed she had become "zoombie like" along with stool incontinence. Daughter reports she takes pain medication for lower back pain, is supposed to have sx but hasn't scheduled it yet. Pt's daughter has been giving her Vicodin, pt has received 38 Vicodin this week and tramadol. Pt has been walking but family reports its slower than normal. Pt a&ox4. Pt denies incontinence. BP 160/80 CBG 145 HR 90 RR16 O2 sats 98%. EMS started 20G in left hand. Pt c/o lower back pain and some left neck pain. Pt reports she has n/v this week but denies n/v at this time.

## 2012-09-14 NOTE — ED Notes (Signed)
Notified EDP about plan of care for patient. EDP advised pt is here for observed for now and advised nurse to update EDP relating to patients status.

## 2012-09-14 NOTE — ED Notes (Signed)
Pt O2 at rest is 88% on RA/ applied 1 liter O2 by nasal cannula to maintain patients O2 at a therapeutic level

## 2012-09-14 NOTE — ED Notes (Signed)
Misty Stanley pt daughter number [phone]

## 2012-09-14 NOTE — ED Notes (Signed)
Pt is alert and oriented x 4/ red socks and yellow band placed for fall risk/ per patients daughter pt consume 38 of her valium by mouth within one week/ per patients daughter strength of medication is unknown.

## 2012-09-14 NOTE — ED Provider Notes (Signed)
History     CSN: [id number]  Arrival date & time 09/14/12  1654   First MD Initiated Contact with Patient 09/14/12 1655      Chief Complaint  Patient presents with  . Altered Mental Status    (Consider location/radiation/quality/duration/timing/severity/associated sxs/prior treatment) Patient is a 66 y.o. female presenting with altered mental status. The history is provided by the patient, the EMS personnel and medical records.  Altered Mental Status Pertinent negatives include no abdominal pain, chest pain, coughing, diaphoresis, fatigue, fever, headaches, nausea, rash or vomiting.    LAMIRACLE CHAIDEZ is a 66 y.o. female  with a significant medical history presents to the Emergency Department complaining of gradual, persistent, progressively worsening back pain decreased level of consciousness onset 1 week ago. Associated symptoms include slowed speech, shuffling gait, fecal and urinary incontinence, decreased activity.  Nothing makes it better and nothing makes it worse.  Pt denies fever, chills, headache, chest pain, shortness of breath abdominal pain, nausea, vomiting, diarrhea, syncope, dysuria, hematuria.  Patient only complaint is low back pain.  Per EMS patient is coming from home and family reports that she has become less interactive in the last week. States pt has a history of back pain and the family has been giving her Vicodin which is not hers and to prescribed tramadol. States she's taken 38 Vicodin in the last week. Also stating patient response slower than normal for the last week and does not want to get out of bed.  Family also states urinary and fecal incontinence without pt's knowledge.  Family denies trauma or fall.    Past Medical History  Diagnosis Date  . Hyperlipidemia     takes Pravastatin daily  . Anxiety and depression   . Osteoarthritis (arthritis due to wear and tear of joints)   . Abdominal aortic aneurysm without rupture     followed by Dr. Aggie Cosier   . Dizziness - giddy   . Cerebrovascular disease     pt states she pulls to the left and staggers alot  . Subclavian steal syndrome   . Coronary artery disease   . H/O blood clots     in right foot-04/24/11  . COPD (chronic obstructive pulmonary disease)   . Foot drop     right since knee replacement  . Chronic back pain   . Bruises easily   . H/O: GI bleed   . Blood transfusion 56yrs ago  . Diabetes mellitus     but controlled with diet and exercise  . Peripheral vascular disease     blood clot in foot following epidural anesthesia - 04/2011  . Arthritis     "all over body"  . Osteoarthritis   . Hypertension     takes  Lisinopril daily, Dr. Donnal Moat. took care of pt. before his retirement   . Complication of anesthesia     ENDED WITH BLOOD CLOTT IN LEG  . Stroke     ministrokes many yrs. ago   . Dysrhythmia     IRREG HEARTBEATS  . CHF (congestive heart failure)     USED TO SEE DR GROVE  . Pneumonia     hx of;last time about 37yrs ago 2008  . Renal insufficiency     SEE MED DR Shelah [place]ow[place]  . Cancer     HX SKIN CA  . Depression     takes Paxil daily  . Subclavian steal syndrome   . Carotid artery occlusion   . AAA (abdominal  aortic aneurysm)   . Shortness of breath     with exertion  . Dizziness   . Vision loss     left eye  . GERD (gastroesophageal reflux disease)     takes Protonix daily  . History of bladder infections     Past Surgical History  Procedure Date  . Rotator cuff repair     bilateral  . Appendectomy   . Vaginal hysterectomy   . Hand surgery     right  . Perineal abscess i & d     necrotizing soft tissue infection 2005  . Nasal sinus surgery   . Carpal tunnel release   . Foot surgery     left  . Cosmetic surgery   . Cardiac catheterization   . Joint replacement     bilateral total knee replacement  . Carotid-subclavian bypass graft 05/02/2012    Procedure: BYPASS GRAFT CAROTID-SUBCLAVIAN;  Surgeon: Pryor Ochoa, MD;   Location: [facility] OR;  Service: Vascular;  Laterality: Left;  . Endarterectomy 05/02/2012    Procedure: ENDARTERECTOMY SUBCLAVIAN;  Surgeon: Pryor Ochoa, MD;  Location: [facility] OR;  Service: Vascular;  Laterality: Left;    Family History  Problem Relation Age of Onset  . Anesthesia problems Neg Hx   . Hypotension Neg Hx   . Malignant hyperthermia Neg Hx   . Pseudochol deficiency Neg Hx   . Diabetes Mother   . Heart disease Mother     before age 61  . Hyperlipidemia Mother   . Hypertension Mother   . Heart attack Mother   . Cancer Father   . Heart disease Father     before age 38  . Hyperlipidemia Father   . Hypertension Father   . Heart attack Father   . Deep vein thrombosis Sister   . Diabetes Sister   . Heart disease Sister   . Hyperlipidemia Sister   . Hypertension Sister   . Heart attack Sister   . Diabetes Brother   . Hypertension Brother   . Diabetes Daughter     History  Substance Use Topics  . Smoking status: Current Some Day Smoker -- 0.2 packs/day for 48 years  . Smokeless tobacco: Current User    Types: Snuff     Comment: uses snuff every once in a while  . Alcohol Use: No     Comment: occ beer    OB History    Grav Para Term Preterm Abortions TAB SAB Ect Mult Living                  Review of Systems  Unable to perform ROS Constitutional: Positive for activity change. Negative for fever, diaphoresis, appetite change, fatigue and unexpected weight change.  HENT: Negative for mouth sores and neck stiffness.   Eyes: Negative for visual disturbance.  Respiratory: Negative for cough, chest tightness, shortness of breath and wheezing.   Cardiovascular: Negative for chest pain.  Gastrointestinal: Negative for nausea, vomiting, abdominal pain, diarrhea and constipation.  Genitourinary: Negative for dysuria, urgency, frequency and hematuria.  Musculoskeletal: Positive for back pain.  Skin: Negative for rash.  Neurological: Positive for speech difficulty.  Negative for syncope, light-headedness and headaches.  Hematological: Bruises/bleeds easily.  Psychiatric/Behavioral: Positive for confusion and altered mental status. Negative for sleep disturbance. The patient is not nervous/anxious.   All other systems reviewed and are negative.    Allergies  Codeine; Nsaids; and Penicillins  Home Medications   Current Outpatient Rx  Name  Route  Sig  Dispense  Refill  . ASPIRIN EC 325 MG PO TBEC   Oral   Take 325 mg by mouth daily.         Marland Kitchen CLOPIDOGREL BISULFATE 75 MG PO TABS   Oral   Take 75 mg by mouth daily.         Marland Kitchen DIAZEPAM 5 MG PO TABS   Oral   Take 5 mg by mouth every 8 (eight) hours as needed.         Marland Kitchen LISINOPRIL 20 MG PO TABS   Oral   Take 20 mg by mouth daily.         Marland Kitchen PANTOPRAZOLE SODIUM 40 MG PO TBEC   Oral   Take 40 mg by mouth daily.         Marland Kitchen PAROXETINE HCL 30 MG PO TABS   Oral   Take 60 mg by mouth every morning.          Marland Kitchen PENTAZOCINE-NALOXONE HCL 50-0.5 MG PO TABS   Oral   Take 1 tablet by mouth every 4 (four) hours as needed for pain. For pain, 2 pills every 4 hours   20 tablet   0   . PRAVASTATIN SODIUM 40 MG PO TABS   Oral   Take 40 mg by mouth daily.            BP 108/67  Pulse 77  Temp 98.7 F (37.1 C) (Oral)  Resp 18  SpO2 95%  Physical Exam  Nursing note and vitals reviewed. Constitutional: She appears well-developed and well-nourished. No distress.  HENT:  Head: Normocephalic and atraumatic.  Right Ear: Tympanic membrane, external ear and ear canal normal.  Left Ear: Tympanic membrane, external ear and ear canal normal.  Nose: Nose normal.  Mouth/Throat: Uvula is midline, oropharynx is clear and moist and mucous membranes are normal. No oropharyngeal exudate, posterior oropharyngeal edema, posterior oropharyngeal erythema or tonsillar abscesses.  Eyes: Conjunctivae normal are normal. Pupils are equal, round, and reactive to light. No scleral icterus. Left eye exhibits  abnormal extraocular motion.       Pt unable to follow EOM command Decreased peripheral vision  Neck: Normal range of motion and full passive range of motion without pain. Neck supple. No spinous process tenderness and no muscular tenderness present. Normal range of motion present.  Cardiovascular: Normal rate, regular rhythm and intact distal pulses.  Exam reveals no gallop and no friction rub.   No murmur heard.      Capillary refill < 3 sec  Pulmonary/Chest: Effort normal and breath sounds normal. No respiratory distress. She has no wheezes. She has no rales. She exhibits no tenderness.  Abdominal: Soft. Bowel sounds are normal. She exhibits no distension and no mass. There is no tenderness. There is no rebound and no guarding.  Musculoskeletal: Normal range of motion. She exhibits no edema.  Neurological: She is alert.       Speech is clear and goal oriented, follows commands Major Cranial nerves without deficit, no facial droop Decreased strength in upper and lower extremities bilaterally including dorsiflexion and plantar flexion with more decrease in lower extremities, equal grip strength Sensation decreased in the lower extremities to light and sharp touch but patient reports this is normal  Decreased reflexes in lower extremities but pt also states this is normal Moves extremities without ataxia, coordination intact Normal finger to nose and rapid alternating movements Neg modified romberg, no pronator drift Shuffling gait with assistance  Skin: Skin is  warm and dry. She is not diaphoretic.  Psychiatric: She has a normal mood and affect.    ED Course  Procedures (including critical care time)  Labs Reviewed  CBC WITH DIFFERENTIAL - Abnormal; Notable for the following:    Hemoglobin 15.7 (*)     Neutrophils Relative 40 (*)     Lymphocytes Relative 52 (*)     All other components within normal limits  COMPREHENSIVE METABOLIC PANEL - Abnormal; Notable for the following:     Glucose, Bld 122 (*)     Creatinine, Ser 1.17 (*)     GFR calc non Af Amer 47 (*)     GFR calc Af Amer 55 (*)     All other components within normal limits  URINALYSIS, ROUTINE W REFLEX MICROSCOPIC - Abnormal; Notable for the following:    Hgb urine dipstick TRACE (*)     All other components within normal limits  URINE RAPID DRUG SCREEN (HOSP PERFORMED) - Abnormal; Notable for the following:    Benzodiazepines POSITIVE (*)     All other components within normal limits  URINE MICROSCOPIC-ADD ON - Abnormal; Notable for the following:    Squamous Epithelial / LPF FEW (*)     All other components within normal limits  TROPONIN I  LACTIC ACID, PLASMA  PROTIME-INR  APTT  ACETAMINOPHEN LEVEL   Dg Chest 2 View  09/14/2012  *RADIOLOGY REPORT*  Clinical Data: Altered mental status.  CHEST - 2 VIEW  Comparison: 12/27/2011  Findings: The heart size and pulmonary vascularity are normal and the lungs are clear considering the shallow inspiration.  No acute osseous abnormality.  IMPRESSION: No acute disease.   Original Report Authenticated By: Francene Boyers, M.D.    Ct Head Wo Contrast  09/14/2012  *RADIOLOGY REPORT*  Clinical Data: Altered mental status.  Nausea vomiting.  CT HEAD WITHOUT CONTRAST  Technique:  Contiguous axial images were obtained from the base of the skull through the vertex without contrast.  Comparison: Brain MRI on 11/30/2011  Findings: There is no evidence of intracranial hemorrhage, brain edema or other signs of acute infarction.  There is no evidence of intracranial mass lesion or mass effect.  No abnormal extra-axial fluid collections are identified.  Ventricles normal in size.  Old lacunar infarcts seen in the left cerebral peduncle.  No skull fracture or other bone abnormality identified.  IMPRESSION:  1.  No acute intracranial abnormality. 2.  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   Original Report Authenticated By: Myles Rosenthal, M.D.     ECG:  Date: 09/14/2012   Rate: 89  Rhythm: normal sinus rhythm  QRS Axis: left  Intervals: normal  ST/T Wave abnormalities: normal  Conduction Disutrbances:none  Narrative Interpretation: non-ischemic ECG  Old EKG Reviewed: unchanged   1. Polypharmacy   2. Confusion   3. Weakness   4. Fall at home       MDM  Carole Civil presents with reducing activity and altered level of consciousness.   Family member indicates that patient was taking 5 mg Valium for which she took 43 pills this week.  She has also had 3, 50 mg tramadol today.  Patient has been mixing these medications with her Talwin and patient's daughter states that this medication never last for as long as it is supposed to either.  Also clarifies the patient has not been incontinent of stool or urine as she feels the urge to go in today was the first time she didn't make it  to the bathroom.  Daughter states she is routinely making a mass in the bathroom.  Also states a history of mini strokes more than 20 years ago.  Also reports the patient has been falling regularly this week, however she is prone to falls.  She has had no loss of consciousness this week.   RN reports the patient is now restless, pulling out her IV and trying to get a bed. She states that she is agitated.  Will give Ativan 1mg  IV.     Pt admitted by Internal Medicine      Dierdre Forth, PA-C 09/14/12 2354

## 2012-09-15 ENCOUNTER — Encounter (HOSPITAL_COMMUNITY): Payer: Self-pay | Admitting: Internal Medicine

## 2012-09-15 ENCOUNTER — Observation Stay (HOSPITAL_COMMUNITY): Payer: Medicare PPO

## 2012-09-15 DIAGNOSIS — F29 Unspecified psychosis not due to a substance or known physiological condition: Secondary | ICD-10-CM

## 2012-09-15 DIAGNOSIS — R5383 Other fatigue: Secondary | ICD-10-CM

## 2012-09-15 DIAGNOSIS — I2699 Other pulmonary embolism without acute cor pulmonale: Secondary | ICD-10-CM

## 2012-09-15 DIAGNOSIS — Z79899 Other long term (current) drug therapy: Secondary | ICD-10-CM

## 2012-09-15 DIAGNOSIS — R4182 Altered mental status, unspecified: Secondary | ICD-10-CM | POA: Diagnosis present

## 2012-09-15 DIAGNOSIS — G9340 Encephalopathy, unspecified: Secondary | ICD-10-CM | POA: Diagnosis present

## 2012-09-15 DIAGNOSIS — R5381 Other malaise: Secondary | ICD-10-CM

## 2012-09-15 DIAGNOSIS — G459 Transient cerebral ischemic attack, unspecified: Secondary | ICD-10-CM

## 2012-09-15 LAB — HEMOGLOBIN A1C
Hgb A1c MFr Bld: 6.9 % — ABNORMAL HIGH (ref <5.7)
Mean Plasma Glucose: 151 mg/dL — ABNORMAL HIGH (ref <117)

## 2012-09-15 LAB — LIPID PANEL: LDL Cholesterol: 161 mg/dL — ABNORMAL HIGH (ref 0–99)

## 2012-09-15 LAB — TROPONIN I
Troponin I: <0.3 ng/mL (ref <0.30)
Troponin I: <0.3 ng/mL (ref <0.30)

## 2012-09-15 LAB — GLUCOSE, CAPILLARY: Glucose-Capillary: 129 mg/dL — ABNORMAL HIGH (ref 70–99)

## 2012-09-15 MED ORDER — ADULT MULTIVITAMIN W/MINERALS CH
1.0000 | ORAL_TABLET | Freq: Every day | ORAL | Status: DC
  Administered 2012-09-15 – 2012-09-18 (×4): 1 via ORAL
  Filled 2012-09-15 (×4): qty 1

## 2012-09-15 MED ORDER — LORAZEPAM 0.5 MG PO TABS: 0.0000 mg | ORAL_TABLET | Freq: Two times a day (BID) | ORAL | Status: DC

## 2012-09-15 MED ORDER — SODIUM CHLORIDE 0.9 % IV SOLN
INTRAVENOUS | Status: DC
  Administered 2012-09-15 (×2): via INTRAVENOUS
  Administered 2012-09-16: 100 mL/h via INTRAVENOUS
  Administered 2012-09-16: 08:00:00 via INTRAVENOUS
  Administered 2012-09-17: 100 mL/h via INTRAVENOUS
  Administered 2012-09-17: 1000 mL via INTRAVENOUS

## 2012-09-15 MED ORDER — LISINOPRIL 20 MG PO TABS
20.0000 mg | ORAL_TABLET | Freq: Every day | ORAL | Status: DC
  Administered 2012-09-15 – 2012-09-16 (×2): 20 mg via ORAL
  Filled 2012-09-15 (×2): qty 1

## 2012-09-15 MED ORDER — CLOPIDOGREL BISULFATE 75 MG PO TABS
75.0000 mg | ORAL_TABLET | Freq: Every day | ORAL | Status: DC
  Administered 2012-09-15 – 2012-09-18 (×4): 75 mg via ORAL
  Filled 2012-09-15 (×6): qty 1

## 2012-09-15 MED ORDER — ASPIRIN EC 325 MG PO TBEC
325.0000 mg | DELAYED_RELEASE_TABLET | Freq: Every day | ORAL | Status: DC
  Administered 2012-09-15 – 2012-09-18 (×4): 325 mg via ORAL
  Filled 2012-09-15 (×6): qty 1

## 2012-09-15 MED ORDER — INSULIN ASPART 100 UNIT/ML ~~LOC~~ SOLN: 0.0000 [IU] | Freq: Every day | SUBCUTANEOUS | Status: DC

## 2012-09-15 MED ORDER — PANTOPRAZOLE SODIUM 40 MG PO TBEC
40.0000 mg | DELAYED_RELEASE_TABLET | Freq: Every day | ORAL | Status: DC
  Administered 2012-09-15 – 2012-09-18 (×4): 40 mg via ORAL
  Filled 2012-09-15 (×4): qty 1

## 2012-09-15 MED ORDER — ENOXAPARIN SODIUM 40 MG/0.4ML ~~LOC~~ SOLN: 40.0000 mg | Freq: Every day | SUBCUTANEOUS | Status: DC

## 2012-09-15 MED ORDER — LORAZEPAM 0.5 MG PO TABS: 1.0000 mg | ORAL_TABLET | Freq: Four times a day (QID) | ORAL | Status: DC | PRN

## 2012-09-15 MED ORDER — PENTAZOCINE-NALOXONE HCL 50-0.5 MG PO TABS
1.0000 | ORAL_TABLET | ORAL | Status: DC | PRN
  Administered 2012-09-15 – 2012-09-16 (×2): 1 via ORAL
  Filled 2012-09-15 (×2): qty 1

## 2012-09-15 MED ORDER — SIMVASTATIN 20 MG PO TABS
20.0000 mg | ORAL_TABLET | Freq: Every day | ORAL | Status: DC
  Administered 2012-09-15 – 2012-09-17 (×3): 20 mg via ORAL
  Filled 2012-09-15 (×4): qty 1

## 2012-09-15 MED ORDER — LORAZEPAM 0.5 MG PO TABS: 0.0000 mg | ORAL_TABLET | Freq: Four times a day (QID) | ORAL | Status: AC

## 2012-09-15 MED ORDER — LORAZEPAM 2 MG/ML IJ SOLN: 1.0000 mg | Freq: Four times a day (QID) | INTRAMUSCULAR | Status: DC | PRN

## 2012-09-15 MED ORDER — PENTAZOCINE-NALOXONE HCL 50-0.5 MG PO TABS: 1.0000 | ORAL_TABLET | ORAL | Status: DC | PRN

## 2012-09-15 MED ORDER — INSULIN ASPART 100 UNIT/ML ~~LOC~~ SOLN
0.0000 [IU] | Freq: Three times a day (TID) | SUBCUTANEOUS | Status: DC
  Administered 2012-09-15 – 2012-09-16 (×2): 1 [IU] via SUBCUTANEOUS
  Administered 2012-09-16: 2 [IU] via SUBCUTANEOUS
  Administered 2012-09-17 – 2012-09-18 (×4): 1 [IU] via SUBCUTANEOUS

## 2012-09-15 MED ORDER — ACETAMINOPHEN 325 MG PO TABS
650.0000 mg | ORAL_TABLET | ORAL | Status: DC | PRN
  Administered 2012-09-17 – 2012-09-18 (×5): 650 mg via ORAL
  Filled 2012-09-15 (×7): qty 2

## 2012-09-15 MED ORDER — INFLUENZA VIRUS VACC SPLIT PF IM SUSP
0.5000 mL | INTRAMUSCULAR | Status: AC
  Filled 2012-09-15: qty 0.5

## 2012-09-15 MED ORDER — PAROXETINE HCL 30 MG PO TABS
60.0000 mg | ORAL_TABLET | ORAL | Status: DC
  Administered 2012-09-15 – 2012-09-18 (×4): 60 mg via ORAL
  Filled 2012-09-15 (×6): qty 2

## 2012-09-15 MED ORDER — PANTOPRAZOLE SODIUM 40 MG PO TBEC: 40.0000 mg | DELAYED_RELEASE_TABLET | Freq: Every day | ORAL | Status: DC

## 2012-09-15 NOTE — Progress Notes (Signed)
I have seen and examined patient with history osteoarthritis and status post back injury about 3 weeks ago-prescribed Vicodin, COPD anxiety/depression, diabetes mellitus, COPD, CAD, subclavian still syndrome, admitted with confusion and her family has had increased Vicodin and Valium use in the past week (38 tablets in one week and 10 more given thereafter for when necessary use per her daughter who then locked the rest away) was reported that she was more agitated and confused as was brought to the ED and was having difficulty even sitting up on her own.she was admitted this a.m. Per Dr Adela Glimpse and the impression is that this is likely secondary to meds, but she is being worked up for CVA. This a.m. She is awake-speech is slow and slurred, oriented x2. Carotid Doppler with no significant ICA stenosis, will follow up on pending CVA workup.  Donnalee Curry MD [facility] [phone]

## 2012-09-15 NOTE — ED Notes (Signed)
Verbal report given to Hilda,Rn on 2600

## 2012-09-15 NOTE — Progress Notes (Signed)
  Echocardiogram 2D Echocardiogram has been performed.  Cathie Beams 09/15/2012, 8:34 AM

## 2012-09-15 NOTE — ED Notes (Signed)
Patients daughter Misty Stanley took patients medications home with her.

## 2012-09-15 NOTE — H&P (Signed)
PCP:    Kaleen Mask, MD [facility]   Chief Complaint:   confusion  HPI: Jocelyn Curtis is a 66 y.o. female   has a past medical history of Hyperlipidemia; Anxiety and depression; Osteoarthritis (arthritis due to wear and tear of joints); Abdominal aortic aneurysm without rupture; Dizziness - giddy; Cerebrovascular disease; Subclavian steal syndrome; Coronary artery disease; H/O blood clots; COPD (chronic obstructive pulmonary disease); Foot drop; Chronic back pain; Bruises easily; H/O: GI bleed; Blood transfusion (26yrs ago); Diabetes mellitus; Peripheral vascular disease; Arthritis; Osteoarthritis; Hypertension; Complication of anesthesia; Stroke; CHF (congestive heart failure); Pneumonia; Renal insufficiency; Cancer; Depression; Subclavian steal syndrome; Carotid artery occlusion; AAA (abdominal aortic aneurysm); Shortness of breath; Dizziness; Vision loss; GERD (gastroesophageal reflux disease); History of bladder infections; and Dysrhythmia.   Presented with  3 weeks ago she had a back injury and was given prescription for Vicodin and valium. Her daughter gave her her own valium in attempt to save money. The strength of prescription was the same. Patient operantly took around 38 tablets with in 1 week and around this time started to have increased somnolence and confusion. When patient asked for more her daughter gave her 10 more tablets and locked the rest away this was about 10 days ago. 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 Patient have been now more agitated when usual still confused.  She has had very hard time walking lately due to generalized weakness but while in ED now able to sit up on her own.   Per family she have had hx of Right foot drop in the past due to possible pinched nerve.  She has hx of frequent falls while at home. She has been  taking narcotics for the past 20 years.   Review of Systems:     Pertinent positives include: chills, back pain, confusion, slurred speech  Constitutional:  No weight loss, night sweats, Fevers, fatigue, weight loss  HEENT:  No headaches, Difficulty swallowing,Tooth/dental problems,Sore throat,  No sneezing, itching, ear ache, nasal congestion, post nasal drip,  Cardio-vascular:  No chest pain, Orthopnea, PND, anasarca, dizziness, palpitations.no Bilateral lower extremity swelling  GI:  No heartburn, indigestion, abdominal pain, nausea, vomiting, diarrhea, change in bowel habits, loss of appetite, melena, blood in stool, hematemesis Resp:  no shortness of breath at rest. No dyspnea on exertion, No excess mucus, no productive cough, No non-productive cough, No coughing up of blood.No change in color of mucus.No wheezing. Skin:  no rash or lesions. No jaundice GU:  no dysuria, change in color of urine, no urgency or frequency. No straining to urinate.  No flank pain.  Musculoskeletal:  No joint pain or no joint swelling. No decreased range of motion. No back pain.  Psych:  No change in mood or affect. No depression or anxiety. No memory loss.  Neuro: no localizing neurological complaints, no tingling, no weakness, no double vision,   Otherwise ROS are negative except for above, 10 systems were reviewed  Past Medical History: Past Medical History  Diagnosis Date  . Hyperlipidemia     takes Pravastatin daily  . Anxiety and depression   . Osteoarthritis (arthritis due to wear and tear of joints)   . Abdominal aortic aneurysm without rupture     followed by Dr. Aggie Cosier  . Dizziness - giddy   . Cerebrovascular disease  pt states she pulls to the left and staggers alot  . Subclavian steal syndrome   . Coronary artery disease   . H/O blood clots     in right foot-04/24/11  . COPD (chronic obstructive pulmonary disease)   . Foot drop     right since knee replacement  .  Chronic back pain   . Bruises easily   . H/O: GI bleed   . Blood transfusion 70yrs ago  . Diabetes mellitus     but controlled with diet and exercise  . Peripheral vascular disease     blood clot in foot following epidural anesthesia - 04/2011  . Arthritis     "all over body"  . Osteoarthritis   . Hypertension     takes  Lisinopril daily, Dr. Donnal Moat. took care of pt. before his retirement   . Complication of anesthesia     ENDED WITH BLOOD CLOTT IN LEG  . Stroke     ministrokes many yrs. ago   . CHF (congestive heart failure)     USED TO SEE DR GROVE  . Pneumonia     hx of;last time about 42yrs ago 2008  . Renal insufficiency     SEE MED DR Shelah Lewandowsky [place]  . Cancer     HX SKIN CA  . Depression     takes Paxil daily  . Subclavian steal syndrome   . Carotid artery occlusion   . AAA (abdominal aortic aneurysm)   . Shortness of breath     with exertion  . Dizziness   . Vision loss     left eye  . GERD (gastroesophageal reflux disease)     takes Protonix daily  . History of bladder infections   . Dysrhythmia     IRREG HEARTBEATS   Past Surgical History  Procedure Date  . Rotator cuff repair     bilateral  . Appendectomy   . Vaginal hysterectomy   . Hand surgery     right  . Perineal abscess i & d     necrotizing soft tissue infection 2005  . Nasal sinus surgery   . Carpal tunnel release   . Foot surgery     left  . Cosmetic surgery   . Cardiac catheterization   . Joint replacement     bilateral total knee replacement  . Carotid-subclavian bypass graft 05/02/2012    Procedure: BYPASS GRAFT CAROTID-SUBCLAVIAN;  Surgeon: Pryor Ochoa, MD;  Location: [facility] OR;  Service: Vascular;  Laterality: Left;  . Endarterectomy 05/02/2012    Procedure: ENDARTERECTOMY SUBCLAVIAN;  Surgeon: Pryor Ochoa, MD;  Location: [facility] OR;  Service: Vascular;  Laterality: Left;     Medications: Prior to Admission medications   Medication Sig Start Date End Date Taking?  Authorizing Provider  aspirin EC 325 MG tablet Take 325 mg by mouth daily.   Yes Historical Provider, MD  clopidogrel (PLAVIX) 75 MG tablet Take 75 mg by mouth daily.   Yes Historical Provider, MD  diazepam (VALIUM) 5 MG tablet Take 5 mg by mouth every 8 (eight) hours as needed.   Yes Historical Provider, MD  lisinopril (PRINIVIL,ZESTRIL) 20 MG tablet Take 20 mg by mouth daily.   Yes Historical Provider, MD  pantoprazole (PROTONIX) 40 MG tablet Take 40 mg by mouth daily.   Yes Historical Provider, MD  PARoxetine (PAXIL) 30 MG tablet Take 60 mg by mouth every morning.    Yes Historical Provider, MD  pentazocine-naloxone (TALWIN NX) 50-0.5  MG per tablet Take 1 tablet by mouth every 4 (four) hours as needed for pain. For pain, 2 pills every 4 hours 05/02/12  Yes Amelia Jo Roczniak, PA  pravastatin (PRAVACHOL) 40 MG tablet Take 40 mg by mouth daily.    Yes Historical Provider, MD    Allergies:   Allergies  Allergen Reactions  . Codeine Nausea And Vomiting  . Nsaids Other (See Comments)    Gi bleed  . Penicillins Rash    Itching Abdominal swelling    Social History:  Ambulatory  With Walker  Lives at   Home with family   reports that she has been smoking.  Her smokeless tobacco use includes Snuff. She reports that she does not drink alcohol or use illicit drugs.   Family History: family history includes Cancer in her father; Deep vein thrombosis in her sister; Diabetes in her brother, daughter, mother, and sister; Heart attack in her father, mother, and sister; Heart disease in her father, mother, and sister; Hyperlipidemia in her father, mother, and sister; and Hypertension in her brother, father, mother, and sister.  There is no history of Anesthesia problems, and Hypotension, and Malignant hyperthermia, and Pseudochol deficiency, .    Physical Exam: Patient Vitals for the past 24 hrs:  BP Temp Temp src Pulse Resp SpO2  09/14/12 2053 108/67 mmHg - - 77  18  95 %  09/14/12 2030 120/78  mmHg - - 81  13  95 %  09/14/12 1945 115/74 mmHg - - 80  14  93 %  09/14/12 1845 110/60 mmHg - - 73  16  100 %  09/14/12 1800 - - - 74  24  97 %  09/14/12 1715 126/74 mmHg - - 84  18  96 %  09/14/12 1701 124/79 mmHg 98.7 F (37.1 C) Oral 93  16  99 %  09/14/12 1700 122/71 mmHg - - 97  19  98 %    1. General:  in No Acute distress 2. Psychological: Alert but not Oriented 3. Head/ENT:    Dry Mucous Membranes                          Head Non traumatic, neck supple                           Poor Dentition 4. SKIN:  decreased Skin turgor,  Skin clean Dry and intact no rash 5. Heart: Regular rate and rhythm no Murmur, Rub or gallop 6. Lungs: Clear to auscultation bilaterally, no wheezes or crackles   7. Abdomen: Soft, non-tender, Non distended 8. Lower extremities: no clubbing, cyanosis, or edema 9. Neurologically Grossly intact, moving all 4 extremities equally but not able to cooperate with exam 10. MSK: Normal range of motion  body mass index is unknown because there is no height or weight on file.   Labs on Admission:   [facility] 09/14/12 1742  NA 139  K 4.2  CL 102  CO2 26  GLUCOSE 122*  BUN 18  CREATININE 1.17*  CALCIUM 10.1  MG --  PHOS --    Basename 09/14/12 1742  AST 17  ALT 9  ALKPHOS 86  BILITOT 0.4  PROT 7.7  ALBUMIN 4.1   No results found for this basename: LIPASE:2,AMYLASE:2 in the last 72 hours  Basename 09/14/12 1742  WBC 7.5  NEUTROABS 3.0  HGB 15.7*  HCT 45.9  MCV 94.8  PLT 183    Basename 09/14/12 1743  CKTOTAL --  CKMB --  CKMBINDEX --  TROPONINI <0.30   No results found for this basename: TSH,T4TOTAL,FREET3,T3FREE,THYROIDAB in the last 72 hours No results found for this basename: VITAMINB12:2,FOLATE:2,FERRITIN:2,TIBC:2,IRON:2,RETICCTPCT:2 in the last 72 hours No results found for this basename: HGBA1C    The CrCl is unknown because both a height and weight (above a minimum accepted value) are required for this calculation. ABG No  results found for this basename: phart, pco2, po2, hco3, tco2, acidbasedef, o2sat     No results found for this basename: DDIMER     Other results:  I have pearsonaly reviewed this: ECG REPORT  Rate: 89  Rhythm: NSR ST&T Change: no ischemic changes  UA no evidence of UTI   Cultures: No results found for this basename: sdes, specrequest, cult, reptstatus       Radiological Exams on Admission: Dg Chest 2 View  09/14/2012  *RADIOLOGY REPORT*  Clinical Data: Altered mental status.  CHEST - 2 VIEW  Comparison: 12/27/2011  Findings: The heart size and pulmonary vascularity are normal and the lungs are clear considering the shallow inspiration.  No acute osseous abnormality.  IMPRESSION: No acute disease.   Original Report Authenticated By: Francene Boyers, M.D.    Ct Head Wo Contrast  09/14/2012  *RADIOLOGY REPORT*  Clinical Data: Altered mental status.  Nausea vomiting.  CT HEAD WITHOUT CONTRAST  Technique:  Contiguous axial images were obtained from the base of the skull through the vertex without contrast.  Comparison: Brain MRI on 11/30/2011  Findings: There is no evidence of intracranial hemorrhage, brain edema or other signs of acute infarction.  There is no evidence of intracranial mass lesion or mass effect.  No abnormal extra-axial fluid collections are identified.  Ventricles normal in size.  Old lacunar infarcts seen in the left cerebral peduncle.  No skull fracture or other bone abnormality identified.  IMPRESSION:  1.  No acute intracranial abnormality. 2.  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   Original Report Authenticated By: Myles Rosenthal, M.D.     Chart has been reviewed  Assessment/Plan  66 year old female with history of chronic narcotic use in recent benzodiazepine overuse that have suddenly stopped here with confusion and slurred speech likely consistent with toxic encephalopathy versus withdrawal from benzodiazepines . Also has history of TIAs in the past with  similar presentation  Present on Admission:  . TIA (transient ischemic attack) - patient presenting with altered mental status confusion and slurred speech this is most likely secondary to polypharmacy and recent benzodiazepine use and sudden discontinuation. She does have a history of TIAs and the family was concerned because she had had similar presentations in the past colectomy secondary to TIA. Will admit per TIA protocol intrusions and aspirin continue her Plavix obtain MRI/MRA of the brain carotid Dopplers and echo gram . Encephalopathy acute - in the setting of heavy benzodiazepine use then sudden discontinuation patient is at risk of densities in control especially since now she started to show signs of agitation will initiate CIWA protocol and Admit to step down this patient requiring extensive nursing care.   Prophylaxis:  Lovenox, Protonix  CODE STATUS: FULL CODE  Other plan as per orders.  I have spent a total of 55 min on this admission  Jocelyn Curtis 09/15/2012, 12:18 AM

## 2012-09-15 NOTE — Progress Notes (Signed)
To MRI via stretcher  

## 2012-09-15 NOTE — ED Provider Notes (Signed)
Medical screening examination/treatment/procedure(s) were performed by non-physician practitioner and as supervising physician I was immediately available for consultation/collaboration.  Alanzo Lamb R. Hanna Ra, MD 09/15/12 0003 

## 2012-09-15 NOTE — Progress Notes (Signed)
VASCULAR LAB PRELIMINARY  PRELIMINARY  PRELIMINARY  PRELIMINARY  Carotid duplex  completed.    Preliminary report:  Bilateral:  No evidence of hemodynamically significant internal carotid artery stenosis.   Vertebral artery flow is antegrade.      Autumnrose Yore, RVT 09/15/2012, 8:52 AM

## 2012-09-15 NOTE — Progress Notes (Signed)
Rehab Admissions Coordinator Note:  Patient was screened by Trish Mage for appropriateness for an Inpatient Acute Rehab Consult.  At this time, we are recommending HH.  If patient does not progress, could consider SNF.  Patient has [facility] and it is very unlikely that we would get approval for acute inpatient rehab admission.  Trish Mage 09/15/2012, 10:19 AM  I can be reached at [phone].

## 2012-09-15 NOTE — ED Notes (Addendum)
Assisted Robyn,EMT in changing cleaning patient after having a BM

## 2012-09-15 NOTE — Evaluation (Signed)
Physical Therapy Evaluation Patient Details Name: Jocelyn Curtis MRN: [id number] DOB: 02/18/1946 Today's Date: 09/15/2012 Time: 4098-1191 PT Time Calculation (min): 15 min  PT Assessment / Plan / Recommendation Clinical Impression  Pt admitted with increased somnolence and AMS after taking valium at home from dgtr for back pain after running out of pt's meds. Pt lethargic on arrival but able to be aroused with movement. Pt with some decreased ability with speech clarity and difficulty maintaining secretions orally. Pt will benefit from acute therapy to maximize mobility, gait, transfers and function prior to discharge. Pending pt progression will determine safest discharge plan as well as family ability to assist.    PT Assessment  Patient needs continued PT services    Follow Up Recommendations  Home health PT;Supervision/Assistance - 24 hour;CIR    Does the patient have the potential to tolerate intense rehabilitation      Barriers to Discharge Decreased caregiver support (pt unable to provide amount of care at home and states dgtr works but unable to state when )      Engineer, agricultural with 5" wheels    Recommendations for Other Services OT consult;Speech consult   Frequency Min 3X/week    Precautions / Restrictions Precautions Precautions: Fall   Pertinent Vitals/Pain No pain reported Sats 92% on RA      Mobility  Bed Mobility Bed Mobility: Rolling Left;Left Sidelying to Sit;Sitting - Scoot to [place] of Bed;Sit to Supine Rolling Left: 3: Mod assist Left Sidelying to Sit: 3: Mod assist Sitting - Scoot to Edge of Bed: 4: Min guard Sit to Supine: 4: Min guard;HOB flat Details for Bed Mobility Assistance: Pt with assist to roll to left secondary to decreased attention/arousal and assist to elevate trunk from surface. pt with cueing for sequence and assist to elevate trunk from surface. Cueing for scooting to EOB and return to  supine. Transfers Transfers: Sit to Stand;Stand to Sit Sit to Stand: 4: Min assist;From bed Stand to Sit: 4: Min assist;To bed Details for Transfer Assistance: pt with cueing for sequence and safety to stand from bed. Pt stepped forward 2 steps but maintained posterior lean with assist to maintain balance and prevent fall then side stepped 2 ft toward [facility] prior to returning to sitting. Increased time to follow commands for mobility with stepping. Ambulation/Gait Ambulation/Gait Assistance: Not tested (comment) Stairs: No    Shoulder Instructions     Exercises     PT Diagnosis: Difficulty walking;Altered mental status  PT Problem List: Decreased activity tolerance;Decreased mobility;Decreased cognition;Decreased knowledge of use of DME;Decreased safety awareness PT Treatment Interventions: Gait training;DME instruction;Functional mobility training;Therapeutic activities;Therapeutic exercise;Balance training;Patient/family education;Cognitive remediation   PT Goals Acute Rehab PT Goals PT Goal Formulation: With patient Time For Goal Achievement: 09/29/12 Potential to Achieve Goals: Good Pt will go Supine/Side to Sit: with modified independence;with HOB 0 degrees PT Goal: Supine/Side to Sit - Progress: Goal set today Pt will go Sit to Supine/Side: with modified independence;with HOB 0 degrees PT Goal: Sit to Supine/Side - Progress: Goal set today Pt will go Sit to Stand: with supervision PT Goal: Sit to Stand - Progress: Goal set today Pt will go Stand to Sit: with supervision PT Goal: Stand to Sit - Progress: Goal set today Pt will Ambulate: >150 feet;with least restrictive assistive device;with supervision PT Goal: Ambulate - Progress: Goal set today  Visit Information  Last PT Received On: 09/15/12 Assistance Needed: +2 (+2 for ambulation)    Subjective Data  Subjective: "Jocelyn Curtis"  Patient Stated Goal: return home   Prior Functioning  Home Living Lives With:  Daughter Available Help at Discharge: Family Type of Home: House Bathroom Shower/Tub: Nurse, adult Toilet: Standard Home Adaptive Equipment: Shower chair with back;Wheelchair - manual;Walker - rolling;Straight cane;Bedside commode/3-in-1 Prior Function Level of Independence: Independent Able to Take Stairs?: Yes Vocation: Unemployed Communication Communication: No difficulties    Cognition  Overall Cognitive Status: Impaired Area of Impairment: Attention Arousal/Alertness: Lethargic Orientation Level: Time;Situation;Disoriented to Behavior During Session: Lethargic Current Attention Level: Focused Cognition - Other Comments: Pt initially difficult to arouse and would not fully arouse until moving pt in bed. Then only able to maintain focused attention with delayed processing and decreased problem solving    Extremity/Trunk Assessment Right Lower Extremity Assessment RLE ROM/Strength/Tone:  (3+/5 hip flexion, knee flex/extension) Left Lower Extremity Assessment LLE ROM/Strength/Tone:  (3+/5 hip flexion, knee flex/extension) Trunk Assessment Trunk Assessment: Kyphotic   Balance Static Sitting Balance Static Sitting - Balance Support: Feet supported;No upper extremity supported Static Sitting - Level of Assistance: 5: Stand by assistance;4: Min assist Static Sitting - Comment/# of Minutes: 5 min initially min assist with progression to minguard   End of Session PT - End of Session Activity Tolerance: Patient tolerated treatment well Patient left: in bed;with nursing in room;Other (comment) (pt returned to bed for transfer to MRI) Nurse Communication: Mobility status  GP Functional Assessment Tool Used: clinical judgement Functional Limitation: Mobility: Walking and moving around Mobility: Walking and Moving Around Current Status ([id number]): At least 40 percent but less than 60 percent impaired, limited or restricted Mobility: Walking and Moving Around Goal Status  [phone]): At least 1 percent but less than 20 percent impaired, limited or restricted   [name] 09/15/2012, 9:57 AM  Delaney Meigs, PT [phone]

## 2012-09-16 DIAGNOSIS — R4182 Altered mental status, unspecified: Secondary | ICD-10-CM

## 2012-09-16 DIAGNOSIS — I959 Hypotension, unspecified: Secondary | ICD-10-CM

## 2012-09-16 LAB — GLUCOSE, CAPILLARY
Glucose-Capillary: 144 mg/dL — ABNORMAL HIGH (ref 70–99)
Glucose-Capillary: 174 mg/dL — ABNORMAL HIGH (ref 70–99)

## 2012-09-16 MED ORDER — ACETAMINOPHEN 500 MG PO TABS
1000.0000 mg | ORAL_TABLET | Freq: Once | ORAL | Status: AC
  Administered 2012-09-16: 1000 mg via ORAL
  Filled 2012-09-16: qty 2

## 2012-09-16 MED ORDER — NICOTINE 7 MG/24HR TD PT24
7.0000 mg | MEDICATED_PATCH | Freq: Every day | TRANSDERMAL | Status: DC
  Administered 2012-09-16 – 2012-09-17 (×2): 7 mg via TRANSDERMAL
  Filled 2012-09-16 (×3): qty 1

## 2012-09-16 NOTE — Progress Notes (Signed)
[facility] PROGRESS NOTE  Jocelyn Curtis YNW:[id number] DOB: [birth year] DOA: 09/14/2012 PCP: Kaleen Mask, MD  Assessment/Plan: Active Problems:  Altered mental status/Weakness -Likely secondary to meds, discussed with daughter today who states she took about 38 valiums in in 1week and had also been taking talwin for back pain. No suicidal ideations. -valium dc'ed, prn ativan  -MRI neg,discussed MRA with Dr Roseanne Reno and he states with the veterbral artery occlusion no further recommendations as collaterals have most likely formed already. Hypotension -likely secondary to meds, improving on ivf, follow and if reamining stable plan transfer to floor, also dc lisinopril for now  Chronic Back Pain -d/c prn  Talwin for now, follow  H/O CHF -monitor fluid status close with IVF as above DM Continue SSI H/O SSS /h/o AAA   Code Status: FULL Family Communication: daughter via phone [phone] Disposition Plan: pending PT eval   Consultants:  none  Procedures:  none  Antibiotics:  NONE  HPI/Subjective: drowsy, oriented x2. Denies SOB.  Objective: Filed Vitals:   09/16/12 0753 09/16/12 0834 09/16/12 1200 09/16/12 1551  BP: 106/51  105/56 99/59  Pulse: 56 92 65 66  Temp: 97.8 F (36.6 C)  98.6 F (37 C) 98.3 F (36.8 C)  TempSrc: Oral  Oral Oral  Resp: 14  12 14   Height:      Weight:      SpO2: 97% 93% 94% 95%    Intake/Output Summary (Last 24 hours) at 09/16/12 1612 Last data filed at 09/16/12 1600  Gross per 24 hour  Intake   2400 ml  Output   1250 ml  Net   1150 ml   Filed Weights   09/15/12 0259 09/15/12 2359  Weight: 94.6 kg (208 lb 8.9 oz) 95 kg (209 lb 7 oz)    Exam:   General:  Drowsy, in NAD,orientedx3  Cardiovascular: RRR nl S1S2  Respiratory: CTAB  Abdomen: soft +BS NT/ND  Extremities: no edema, no cyanosis  Data Reviewed: Basic Metabolic Panel:  Lab 09/14/12 6295  NA 139  K 4.2  CL 102  CO2 26  GLUCOSE 122*  BUN  18  CREATININE 1.17*  CALCIUM 10.1  MG --  PHOS --   Liver Function Tests:  Lab 09/14/12 1742  AST 17  ALT 9  ALKPHOS 86  BILITOT 0.4  PROT 7.7  ALBUMIN 4.1   No results found for this basename: LIPASE:5,AMYLASE:5 in the last 168 hours No results found for this basename: AMMONIA:5 in the last 168 hours CBC:  Lab 09/14/12 1742  WBC 7.5  NEUTROABS 3.0  HGB 15.7*  HCT 45.9  MCV 94.8  PLT 183   Cardiac Enzymes:  Lab 09/15/12 1538 09/15/12 0844 09/15/12 0605 09/14/12 1743  CKTOTAL -- -- -- --  CKMB -- -- -- --  CKMBINDEX -- -- -- --  TROPONINI <0.30 <0.30 <0.30 <0.30   BNP (last 3 results) No results found for this basename: PROBNP:3 in the last 8760 hours CBG:  Lab 09/16/12 1159 09/16/12 0757 09/15/12 2158 09/15/12 1602 09/15/12 1211  GLUCAP 144* 115* 129* 112* 115*    Recent Results (from the past 240 hour(s))  MRSA PCR SCREENING     Status: Normal   Collection Time   09/15/12  2:58 AM      Component Value Range Status Comment   MRSA by PCR NEGATIVE  NEGATIVE Final      Studies: Dg Chest 2 View  09/14/2012  *RADIOLOGY REPORT*  Clinical Data: Altered mental status.  CHEST - 2 VIEW  Comparison: 12/27/2011  Findings: The heart size and pulmonary vascularity are normal and the lungs are clear considering the shallow inspiration.  No acute osseous abnormality.  IMPRESSION: No acute disease.   Original Report Authenticated By: Francene Boyers, M.D.    Ct Head Wo Contrast  09/14/2012  *RADIOLOGY REPORT*  Clinical Data: Altered mental status.  Nausea vomiting.  CT HEAD WITHOUT CONTRAST  Technique:  Contiguous axial images were obtained from the base of the skull through the vertex without contrast.  Comparison: Brain MRI on 11/30/2011  Findings: There is no evidence of intracranial hemorrhage, brain edema or other signs of acute infarction.  There is no evidence of intracranial mass lesion or mass effect.  No abnormal extra-axial fluid collections are identified.   Ventricles normal in size.  Old lacunar infarcts seen in the left cerebral peduncle.  No skull fracture or other bone abnormality identified.  IMPRESSION:  1.  No acute intracranial abnormality. 2.  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   Original Report Authenticated By: Myles Rosenthal, M.D.    Mri Brain Without Contrast  09/15/2012  *RADIOLOGY REPORT*  Clinical Data:  Rule out stroke.  MRI HEAD WITHOUT CONTRAST MRA HEAD WITHOUT CONTRAST  Technique:  Multiplanar, multiecho pulse sequences of the brain and surrounding structures were obtained without intravenous contrast. Angiographic images of the head were obtained using MRA technique without contrast.  Comparison:  CT 09/14/2012, MRI 11/30/2011  MRI HEAD  Findings:  Image quality degraded by patient motion.  Allowing for this, no acute infarct is identified.  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  Mild chronic microvascular ischemic changes in the white matter are similar to the prior MRI. Negative for hemorrhage or mass lesion.  Ventricle size is normal  IMPRESSION: No acute abnormality.  Image quality degraded by moderate motion.  MRA HEAD  Findings: Image quality degraded by motion.  Distal right vertebral artery is seen at the level C1 but does not show flow distal to this.  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  Left vertebral artery is relatively small but patent to the basilar. The basilar is relatively small due to fetal origin of both posterior cerebral arteries.  The basilar is patent.  Hypoplastic P1 segments bilaterally.  Posterior cerebral arteries are patent bilaterally without stenosis.  Internal carotid artery is patent bilaterally.  There is irregularity the middle cerebral artery branches which was not present previously and may be due to motion on the study.  IMPRESSION: Image quality degraded by motion.  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   Original Report Authenticated By: Janeece Riggers, M.D.    Mr Mra Head/brain Wo Cm  09/15/2012  *RADIOLOGY REPORT*  Clinical Data:  Rule out stroke.  MRI HEAD WITHOUT CONTRAST MRA HEAD WITHOUT CONTRAST  Technique:  Multiplanar, multiecho pulse sequences of the brain and surrounding structures were obtained without intravenous contrast. Angiographic images of the head were obtained using MRA technique without contrast.  Comparison:  CT 09/14/2012, MRI 11/30/2011  MRI HEAD  Findings:  Image quality degraded by patient motion.  Allowing for this, no acute infarct is identified.  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  Mild chronic microvascular ischemic changes in the white matter are similar to the prior MRI. Negative for hemorrhage or mass lesion.  Ventricle size is normal  IMPRESSION: No acute abnormality.  Image quality degraded by moderate motion.  MRA HEAD  Findings: Image quality degraded by motion.  Distal right vertebral artery is seen at the level C1 but does not show flow distal to this.  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  Left vertebral artery is relatively small but patent to the basilar. The basilar is relatively small due to fetal origin of both posterior cerebral arteries.  The basilar is patent.  Hypoplastic P1 segments bilaterally.  Posterior cerebral arteries are patent bilaterally without stenosis.  Internal carotid artery is patent bilaterally.  There is irregularity the middle cerebral artery branches which was not present previously and may be due to motion on the study.  IMPRESSION: Image quality degraded by motion.  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   Original Report Authenticated By: Janeece Riggers, M.D.     Scheduled Meds:   . aspirin EC  325 mg Oral Daily  . clopidogrel  75 mg Oral Q  breakfast  . influenza  inactive virus vaccine  0.5 mL Intramuscular Tomorrow-1000  . insulin aspart  0-5 Units Subcutaneous QHS  . insulin aspart  0-9 Units Subcutaneous TID WC  . lisinopril  20 mg Oral Daily  . LORazepam  0-4 mg Oral Q6H   Followed by  . LORazepam  0-4 mg Oral Q12H  . multivitamin with minerals  1 tablet Oral Daily  . pantoprazole  40 mg Oral Daily  . PARoxetine  60 mg Oral BH-q7a  . simvastatin  20 mg Oral q1800   Continuous Infusions:   . sodium chloride 100 mL/hr at 09/16/12 1610    Active Problems:  Weakness  Altered mental status  Polypharmacy  Encephalopathy acute  TIA (transient ischemic attack)    Time spent:35    Kela Millin  [facility] Pager [phone] If 8PM-8AM, please contact night-coverage at [email or web address], password [facility] 09/16/2012, 4:12 PM  LOS: 2 days

## 2012-09-16 NOTE — Evaluation (Signed)
Occupational Therapy Evaluation Patient Details Name: Jocelyn Curtis MRN: [id number] DOB: 09-10-1946 Today's Date: 09/16/2012 Time: 7829-5621 OT Time Calculation (min): 26 min  OT Assessment / Plan / Recommendation Clinical Impression  This 66 yo female admiited for confusion and found to have had taken an increased number of pain pills over the last week (some of them hers and some of them her daughters) presents to acute OT with problems below. Will benefit from acute OT with follow up HHOT.    OT Assessment  Patient needs continued OT Services    Follow Up Recommendations  Home health OT    Barriers to Discharge None    Equipment Recommendations  None recommended by OT       Frequency  Min 2X/week    Precautions / Restrictions Precautions Precautions: Fall Precaution Comments: Left off of O2 in room due to pt's sats showing 95% on RA--RN Samson Frederic) made aware and ok'd to leave O2 off Restrictions Weight Bearing Restrictions: No   Pertinent Vitals/Pain Pubic area pain--not rated; RN made aware    ADL  Eating/Feeding: Simulated;Independent Where Assessed - Eating/Feeding: Edge of bed Grooming: Performed;Brushing hair;Minimal assistance;Wash/dry face Where Assessed - Grooming: Unsupported sitting Upper Body Bathing: Simulated;Minimal assistance Where Assessed - Upper Body Bathing: Unsupported sitting Lower Body Bathing: Simulated;Moderate assistance Where Assessed - Lower Body Bathing: Supported sit to stand Upper Body Dressing: Simulated;Minimal assistance Where Assessed - Upper Body Dressing: Unsupported sitting Lower Body Dressing: Performed;Moderate assistance (for socks (can doff with increased time, but not donn)) Where Assessed - Lower Body Dressing: Supported sit to Pharmacist, hospital: Simulated;Moderate assistance Toilet Transfer Method: Sit to Barista:  (bed to recliner) Toileting - Clothing Manipulation and Hygiene:  Simulated;Moderate assistance Where Assessed - Toileting Clothing Manipulation and Hygiene: Standing Transfers/Ambulation Related to ADLs: Min A sit to stand and stand to sit, Mod A for ambulation without AD    OT Diagnosis: Generalized weakness;Acute pain;Cognitive deficits  OT Problem List: Decreased strength;Decreased activity tolerance;Impaired balance (sitting and/or standing);Decreased knowledge of use of DME or AE;Pain;Decreased cognition OT Treatment Interventions: Self-care/ADL training;DME and/or AE instruction;Patient/family education;Balance training   OT Goals Acute Rehab OT Goals OT Goal Formulation: With patient Time For Goal Achievement: 09/30/12 Potential to Achieve Goals: Good ADL Goals Pt Will Perform Grooming: with set-up;with supervision;Unsupported;Standing at sink ADL Goal: Grooming - Progress: Goal set today Pt Will Perform Upper Body Bathing: with set-up;with supervision;Unsupported;Sitting at sink;Standing at sink ADL Goal: Upper Body Bathing - Progress: Goal set today Pt Will Perform Lower Body Bathing: with set-up;with supervision;Unsupported;Sitting at sink;Standing at sink ADL Goal: Lower Body Bathing - Progress: Goal set today Pt Will Perform Upper Body Dressing: with set-up;with supervision;Sitting, bed;Sitting, chair;Unsupported ADL Goal: Upper Body Dressing - Progress: Goal set today Pt Will Perform Lower Body Dressing: with set-up;with supervision;Unsupported;Sit to stand from bed;Sit to stand from chair ADL Goal: Lower Body Dressing - Progress: Goal set today Pt Will Transfer to Toilet: with supervision;Ambulation;with DME;Comfort height toilet;Regular height toilet;Grab bars ADL Goal: Toilet Transfer - Progress: Goal set today Pt Will Perform Toileting - Clothing Manipulation: Independently;Standing ADL Goal: Toileting - Clothing Manipulation - Progress: Goal set today Pt Will Perform Toileting - Hygiene: Independently;Sit to stand from  3-in-1/toilet ADL Goal: Toileting - Hygiene - Progress: Goal set today Miscellaneous OT Goals Miscellaneous OT Goal #1: Pt will be independent in and OOB for BADLs OT Goal: Miscellaneous Goal #1 - Progress: Goal set today  Visit Information  Last OT Received On: 09/16/12 Assistance  Needed: +1    Subjective Data  Subjective: I have had 14 surgeries   Prior Functioning     Home Living Lives With: Daughter (and son in law) Available Help at Discharge: Family;Available 24 hours/day Type of Home: House Bathroom Shower/Tub: Tub/shower unit;Curtain Bathroom Toilet: Standard Home Adaptive Equipment: Shower chair with back;Wheelchair - manual;Walker - rolling;Straight cane;Bedside commode/3-in-1 Prior Function Level of Independence: Independent Able to Take Stairs?: Yes Vocation: Unemployed Communication Communication: No difficulties Dominant Hand: Right            Cognition  Overall Cognitive Status: Impaired Area of Impairment: Attention;Safety/judgement Arousal/Alertness: Lethargic Orientation Level: Disoriented to;Situation Behavior During Session: Flat affect (lethargic) Current Attention Level: Sustained Safety/Judgement: Decreased awareness of need for assistance Cognition - Other Comments: Pt slow to process and generally sluggish throughout tx although more alert than eval.     Extremity/Trunk Assessment Right Upper Extremity Assessment RUE ROM/Strength/Tone: Deficits RUE ROM/Strength/Tone Deficits: Old right shoulder surgery so AROM/PROM diminshed for flexion and abduction, rest WFL Left Upper Extremity Assessment LUE ROM/Strength/Tone: Deficits LUE ROM/Strength/Tone Deficits: Old left shoulder surgery so AROM/PROM diminshed for flexion and abduction, rest WFL     Mobility Bed Mobility Bed Mobility: Supine to Sit Supine to Sit: 4: Min assist Sitting - Scoot to Edge of Bed: 4: Min guard Sit to Supine: 5: Supervision Details for Bed Mobility Assistance:  cueing for sequence with assist to fully elevate trunk from surface and max cueing for hand placement and safety Transfers Transfers: Sit to Stand;Stand to Sit Sit to Stand: 4: Min assist;With upper extremity assist;From bed Stand to Sit: 4: Min assist;With upper extremity assist;To bed Details for Transfer Assistance: VCs for hand placement        Exercise General Exercises - Lower Extremity Hip Flexion/Marching: AAROM;Both;10 reps;Seated   Balance Static Sitting Balance Static Sitting - Balance Support: No upper extremity supported;Bilateral upper extremity supported Static Sitting - Level of Assistance: 5: Stand by assistance Static Sitting - Comment/# of Minutes: 2 Dynamic Sitting Balance Dynamic Sitting - Balance Support: No upper extremity supported;Feet supported Dynamic Sitting - Level of Assistance: 4: Min assist Dynamic Sitting Balance - Compensations: pt with LOB left while trying to don socks EOB and required assist to prevent falling   End of Session OT - End of Session Activity Tolerance: Patient limited by fatigue Patient left: in bed;with call bell/phone within reach;with bed alarm set;with family/visitor present (daughter Jocelyn Curtis) and son-in-law she lives with) Nurse Communication:  (Pt wanting pain meds, O2 left off, and nicotine patch)  GO Functional Assessment Tool Used: Clinical judgement Functional Limitation: Self care Self Care Current Status (N8295): At least 40 percent but less than 60 percent impaired, limited or restricted Self Care Goal Status (A2130): At least 1 percent but less than 20 percent impaired, limited or restricted   Evette Georges [phone] 09/16/2012, 11:36 AM

## 2012-09-16 NOTE — Progress Notes (Signed)
Physical Therapy Treatment Patient Details Name: Jocelyn Curtis MRN: [id number] DOB: August 20, 1946 Today's Date: 09/16/2012 Time: 0801-0829 PT Time Calculation (min): 28 min  PT Assessment / Plan / Recommendation Comments on Treatment Session  Pt admitted with AMS and somnolence after taking increased pain meds for back pain, MRI negative. Pt progressing with mobility today and although still lethargic more alert and participative than last session. Pt states she has RW and dgtr at home for assistance. Will continue to follow to maximize mobility and safety. Recommend daily mobility with nursing staff    Follow Up Recommendations  Home health PT;Supervision/Assistance - 24 hour     Does the patient have the potential to tolerate intense rehabilitation     Barriers to Discharge        Equipment Recommendations  None recommended by PT    Recommendations for Other Services    Frequency     Plan Discharge plan needs to be updated;Frequency remains appropriate    Precautions / Restrictions Precautions Precautions: Fall Precaution Comments: oxygen   Pertinent Vitals/Pain Generalized back pain chronic, 4/10 sats 93-99% on 2L with activity dropped to 85% on RA and returned to 2L     Mobility  Bed Mobility Bed Mobility: Supine to Sit Supine to Sit: 4: Min assist;With rails;HOB flat Sitting - Scoot to Edge of Bed: 4: Min guard Details for Bed Mobility Assistance: cueing for sequence with assist to fully elevate trunk from surface and max cueing for hand placement and safety Transfers Transfers: Sit to Stand;Stand to Sit;Stand Pivot Transfers Sit to Stand: 4: Min assist;From bed;From chair/3-in-1 Stand to Sit: 4: Min assist;To chair/3-in-1 Stand Pivot Transfers: 4: Min assist Details for Transfer Assistance: cueing for hand placement and safety with assist for anterior translation from bed and to back fully to surface prior to sitting Ambulation/Gait Ambulation/Gait Assistance: 4:  Min assist Ambulation Distance (Feet): 120 Feet Assistive device: Rolling walker Ambulation/Gait Assistance Details: cueing for posture, position in RW and assist for turns and direction to room  Gait Pattern: Step-through pattern;Decreased stride length;Trunk flexed Gait velocity: decreased Stairs: No    Exercises General Exercises - Lower Extremity Hip Flexion/Marching: AAROM;Both;10 reps;Seated   PT Diagnosis:    PT Problem List:   PT Treatment Interventions:     PT Goals Acute Rehab PT Goals PT Goal: Supine/Side to Sit - Progress: Progressing toward goal PT Goal: Sit to Stand - Progress: Progressing toward goal PT Goal: Stand to Sit - Progress: Progressing toward goal PT Goal: Ambulate - Progress: Progressing toward goal  Visit Information  Last PT Received On: 09/16/12 Assistance Needed: +1    Subjective Data  Subjective: I'm just tired   Cognition  Overall Cognitive Status: Impaired Area of Impairment: Attention;Safety/judgement Arousal/Alertness: Lethargic Orientation Level: Disoriented to;Situation Behavior During Session: Flat affect Current Attention Level: Sustained Safety/Judgement: Decreased awareness of need for assistance Cognition - Other Comments: Pt slow to process and generally sluggish throughout tx although more alert than eval.     Balance  Static Sitting Balance Static Sitting - Balance Support: No upper extremity supported;Bilateral upper extremity supported Static Sitting - Level of Assistance: 5: Stand by assistance Static Sitting - Comment/# of Minutes: 2 Dynamic Sitting Balance Dynamic Sitting - Balance Support: No upper extremity supported;Feet supported Dynamic Sitting - Level of Assistance: 4: Min assist Dynamic Sitting Balance - Compensations: pt with LOB left while trying to don socks EOB and required assist to prevent falling  End of Session PT - End of Session Equipment  Utilized During Treatment: Gait belt Activity Tolerance:  Patient tolerated treatment well Patient left: in chair;with call bell/phone within reach   GP     Jocelyn Curtis 09/16/2012, 8:41 AM Jocelyn Curtis, PT [phone]

## 2012-09-17 DIAGNOSIS — Y92009 Unspecified place in unspecified non-institutional (private) residence as the place of occurrence of the external cause: Secondary | ICD-10-CM

## 2012-09-17 DIAGNOSIS — W19XXXA Unspecified fall, initial encounter: Secondary | ICD-10-CM

## 2012-09-17 LAB — GLUCOSE, CAPILLARY
Glucose-Capillary: 132 mg/dL — ABNORMAL HIGH (ref 70–99)
Glucose-Capillary: 156 mg/dL — ABNORMAL HIGH (ref 70–99)

## 2012-09-17 LAB — BASIC METABOLIC PANEL
BUN: 18 mg/dL (ref 6–23)
Chloride: 106 mEq/L (ref 96–112)
Creatinine, Ser: 1.08 mg/dL (ref 0.50–1.10)
GFR calc Af Amer: 61 mL/min — ABNORMAL LOW (ref >90)
GFR calc non Af Amer: 52 mL/min — ABNORMAL LOW (ref >90)

## 2012-09-17 MED ORDER — INFLUENZA VIRUS VACC SPLIT PF IM SUSP
0.5000 mL | INTRAMUSCULAR | Status: AC
  Administered 2012-09-18: 0.5 mL via INTRAMUSCULAR
  Filled 2012-09-17: qty 0.5

## 2012-09-17 NOTE — Progress Notes (Signed)
[facility] PROGRESS NOTE  Jocelyn Curtis ZOX:[id number] DOB: 1946/06/05 DOA: 09/14/2012 PCP: Kaleen Mask, MD  Assessment/Plan: Active Problems:  Altered mental status/Weakness -Likely secondary to meds, discussed with daughter  who states she took about 38 valiums in 1week, pt admits this as well. And also been taking talwin for back pain. No suicidal ideations. 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  Hypotension -likely secondary to meds, improving on ivf,  stable plan transfer to floor, also held lisinopril for now   Chronic Back Pain -d/c prn  Talwin for now, follow   H/O CHF -monitor fluid status close with IVF as above  DM Continue SSI  H/O SSS /h/o AAA   Code Status: FULL Family Communication: daughter via phone [phone] Disposition Plan: pending PT eval, home tomorrow if stable   Consultants:  none  Procedures:  none  Antibiotics:  NONE  HPI/Subjective: drowsy, oriented x2. Denies SOB.  Objective: Filed Vitals:   09/16/12 2342 09/17/12 0401 09/17/12 0734 09/17/12 1157  BP: 92/47 91/53 116/70 104/82  Pulse: 68 52 74 52  Temp:  98.1 F (36.7 C) 97.6 F (36.4 C) 98.4 F (36.9 C)  TempSrc:  Oral Oral Oral  Resp: 14 12 24 14   Height:      Weight: 100.3 kg (221 lb 1.9 oz)     SpO2: 89% 93% 99% 94%    Intake/Output Summary (Last 24 hours) at 09/17/12 1251 Last data filed at 09/17/12 0900  Gross per 24 hour  Intake   1940 ml  Output    750 ml  Net   1190 ml   Filed Weights   09/15/12 0259 09/15/12 2359 09/16/12 2342  Weight: 94.6 kg (208 lb 8.9 oz) 95 kg (209 lb 7 oz) 100.3 kg (221 lb 1.9 oz)    Exam:   General:  Alert, awake , oriented x3, no distress  Cardiovascular: RRR nl S1S2  Respiratory: CTAB  Abdomen: soft +BS NT/ND  Extremities: no  edema, no cyanosis  Data Reviewed: Basic Metabolic Panel:  Lab 09/17/12 8295 09/14/12 1742  NA 141 139  K 3.8 4.2  CL 106 102  CO2 25 26  GLUCOSE 124* 122*  BUN 18 18  CREATININE 1.08 1.17*  CALCIUM 9.0 10.1  MG -- --  PHOS -- --   Liver Function Tests:  Lab 09/14/12 1742  AST 17  ALT 9  ALKPHOS 86  BILITOT 0.4  PROT 7.7  ALBUMIN 4.1   No results found for this basename: LIPASE:5,AMYLASE:5 in the last 168 hours No results found for this basename: AMMONIA:5 in the last 168 hours CBC:  Lab 09/14/12 1742  WBC 7.5  NEUTROABS 3.0  HGB 15.7*  HCT 45.9  MCV 94.8  PLT 183   Cardiac Enzymes:  Lab 09/15/12 1538 09/15/12 0844 09/15/12 0605 09/14/12 1743  CKTOTAL -- -- -- --  CKMB -- -- -- --  CKMBINDEX -- -- -- --  TROPONINI <0.30 <0.30 <0.30 <0.30   BNP (last 3 results) No results found for this basename: PROBNP:3 in the last 8760 hours CBG:  Lab 09/17/12 0737 09/16/12 2118 09/16/12 1648 09/16/12 1159 09/16/12 0757  GLUCAP 125* 123* 174* 144* 115*    Recent Results (from the past 240 hour(s))  MRSA PCR SCREENING     Status: Normal   Collection Time   09/15/12  2:58  AM      Component Value Range Status Comment   MRSA by PCR NEGATIVE  NEGATIVE Final      Studies: No results found.  Scheduled Meds:    . aspirin EC  325 mg Oral Daily  . clopidogrel  75 mg Oral Q breakfast  . insulin aspart  0-5 Units Subcutaneous QHS  . insulin aspart  0-9 Units Subcutaneous TID WC  . multivitamin with minerals  1 tablet Oral Daily  . nicotine  7 mg Transdermal Daily  . pantoprazole  40 mg Oral Daily  . PARoxetine  60 mg Oral BH-q7a  . simvastatin  20 mg Oral q1800   Continuous Infusions:    . sodium chloride 1,000 mL (09/17/12 0900)    Active Problems:  Weakness  Altered mental status  Polypharmacy  Encephalopathy acute  TIA (transient ischemic attack)  Hypotension    Time spent:35    Jocelyn Curtis  [facility] Pager [phone] If 8PM-8AM,  please contact night-coverage at [email or web address], password [facility] 09/17/2012, 12:51 PM  LOS: 3 days

## 2012-09-17 NOTE — Progress Notes (Signed)
Transferred patient to 5531 per wheelchair.  Tried to call Ms. Artemio Aly, daughter, but she's unavailable. I spoke with Maisie Fus and informed him of this transfer.

## 2012-09-18 LAB — GLUCOSE, CAPILLARY: Glucose-Capillary: 131 mg/dL — ABNORMAL HIGH (ref 70–99)

## 2012-09-18 MED ORDER — TRAMADOL HCL 50 MG PO TABS: 50.0000 mg | ORAL_TABLET | Freq: Four times a day (QID) | ORAL | Status: DC | PRN

## 2012-09-18 NOTE — Care Management Note (Signed)
    Page 1 of 2   09/18/2012     9:59:16 AM   CARE MANAGEMENT NOTE 09/18/2012  Patient:  Jocelyn Curtis, Jocelyn Curtis   Account Number:  [id number]  Date Initiated:  09/15/2012  Documentation initiated by:  Alvira Philips Assessment:   66 yr-old female adm with AMS - ? drug WD; lives with family, has rolling walker and 3-N-1, h/o home health services through [facility]     Action/Plan:   Anticipated DC Date:  09/18/2012   Anticipated DC Plan:  HOME W HOME HEALTH SERVICES      DC Planning Services  CM consult      [facility] Choice  HOME HEALTH   Choice offered to / List presented to:  C-4 Adult Children        HH arranged  HH-2 PT  HH-3 OT      HH agency  [facility]   Status of service:  Completed, signed off Medicare Important Message given?   (If response is "NO", the following Medicare IM given date fields will be blank) Date Medicare IM given:   Date Additional Medicare IM given:    Discharge Disposition:  HOME W HOME HEALTH SERVICES  Per UR Regulation:  Reviewed for med. necessity/level of care/duration of stay  If discussed at Long Length of Stay Meetings, dates discussed:    Comments:  PCP: Dr Windle Guard  Contact: Artemio Aly, dtr [phone]  09/18/12 9:55 Letha Cape RN, BSN [phone] patient is for possible dc today, [facility] with [facility] notified.  Soc will begin 24-48 hrs post discharge.  09/16/12 1527 Henrietta Mayo RN BSN MSN CCM PT/OT recommend home health therapies.  TC to Dr Jeannetta Nap and discussed same, he wants to talk with pt and dtr.  Pt and dtr discussed need with him and he agreed to sign orders. Provided list of agencies to dtr and referral made to [facility] as requested.  Per [facility] liaison, previous services were ordered by orthopedist, they cannot take referral for pt of Dr Jeannetta Nap.  Second choice is [facility], they are able  is able to staff and will accept the referral.  09/16/12 1400 Henrietta Mayo RN BSN  MSN CCM PT/OT recommend home therapy.  Discussed with pt and dtr and they agree.  TC to Dr Jeannetta Nap to discuss - he wants to talk with pt.

## 2012-09-18 NOTE — Progress Notes (Signed)
Jocelyn Curtis to be D/C'd Home per MD order.  Discussed with the patient and all questions fully answered.   [name], [name]  Home Medication Instructions NFA:[id number]   Printed on:09/18/12 1527  Medication Information                    PARoxetine (PAXIL) 30 MG tablet Take 60 mg by mouth every morning.            clopidogrel (PLAVIX) 75 MG tablet Take 75 mg by mouth daily.           aspirin EC 325 MG tablet Take 325 mg by mouth daily.           lisinopril (PRINIVIL,ZESTRIL) 20 MG tablet Take 20 mg by mouth daily.           pantoprazole (PROTONIX) 40 MG tablet Take 40 mg by mouth daily.           pravastatin (PRAVACHOL) 40 MG tablet Take 40 mg by mouth daily.            traMADol (ULTRAM) 50 MG tablet Take 1 tablet (50 mg total) by mouth every 6 (six) hours as needed for pain.             VVS, Skin clean, dry and intact without evidence of skin break down, no evidence of skin tears noted. IV catheter discontinued intact. Site without signs and symptoms of complications. Dressing and pressure applied.  An After Visit Summary was printed and given to the patient. Follow up appointments , new prescriptions and medication administration times given Patient escorted via WC, and D/C home via private auto.  Cindra Eves, RN 09/18/2012 3:27 PM

## 2012-09-18 NOTE — Progress Notes (Signed)
Occupational Therapy Treatment Patient Details Name: Jocelyn Curtis MRN: [id number] DOB: 05-12-1946 Today's Date: 09/18/2012 Time: 5621-3086 OT Time Calculation (min): 18 min  OT Assessment / Plan / Recommendation Comments on Treatment Session Pt progressing toward goals.  More alert and oriented today.    Follow Up Recommendations  Home health OT    Barriers to Discharge       Equipment Recommendations  None recommended by OT    Recommendations for Other Services    Frequency Min 2X/week   Plan Discharge plan remains appropriate    Precautions / Restrictions Precautions Precautions: Fall   Pertinent Vitals/Pain See vitals    ADL  Grooming: Performed;Wash/dry hands;Min guard Where Assessed - Grooming: Supported standing Toilet Transfer: Mining engineer Method: Sit to Barista: Other (comment) (from bed ambulating to chair) Equipment Used: Gait belt Transfers/Ambulation Related to ADLs: min assist for ambulation around bed to sink and then to chair.  Intermittent HHA.  Pt takes slow shuffled steps. ADL Comments: Discussed with pt having daughter assist with medication management at home to ensure correct meds/dosage and pt verbalized understanding.    OT Diagnosis:    OT Problem List:   OT Treatment Interventions:     OT Goals ADL Goals Pt Will Perform Grooming: with set-up;with supervision;Unsupported;Standing at sink ADL Goal: Grooming - Progress: Progressing toward goals Pt Will Transfer to Toilet: with supervision;Ambulation;with DME;Comfort height toilet;Regular height toilet;Grab bars ADL Goal: Toilet Transfer - Progress: Progressing toward goals Miscellaneous OT Goals Miscellaneous OT Goal #1: Pt will be independent in and OOB for BADLs OT Goal: Miscellaneous Goal #1 - Progress: Progressing toward goals  Visit Information  Last OT Received On: 09/18/12 Assistance Needed: +1    Subjective Data       Prior Functioning       Cognition  Overall Cognitive Status: Impaired Area of Impairment: Problem solving Arousal/Alertness: Awake/alert Orientation Level: Appears intact for tasks assessed Behavior During Session: [facility] for tasks performed Safety/Judgement: Impulsive Problem Solving: Pt required increased time and min verbal cueing for using phone.  Pt required several attempts to dial correct phone number. Cognition - Other Comments: Slightly impulsive when attempting to stand from bed unassisted when OT was preparing recliner.    Mobility  Shoulder Instructions Bed Mobility Bed Mobility: Supine to Sit;Sitting - Scoot to Edge of Bed Rolling Left: 5: Supervision Left Sidelying to Sit: 5: Supervision Transfers Transfers: Sit to Stand;Stand to Sit Sit to Stand: 4: Min assist;From bed;With upper extremity assist Stand to Sit: 4: Min guard;To chair/3-in-1;With upper extremity assist Details for Transfer Assistance: VS for safe hand placement.       Exercises      Balance     End of Session OT - End of Session Equipment Utilized During Treatment: Gait belt Activity Tolerance: Patient tolerated treatment well Patient left: in chair;with call bell/phone within reach Nurse Communication: Mobility status  GO   09/18/2012 Cipriano Mile OTR/L Pager [phone] Office [phone]   Cipriano Mile 09/18/2012, 12:29 PM

## 2012-09-18 NOTE — Progress Notes (Signed)
Pts manual BP is 96/54. MD on call notified. No new orders given.

## 2012-09-19 ENCOUNTER — Other Ambulatory Visit: Payer: Self-pay | Admitting: Family Medicine

## 2012-09-19 DIAGNOSIS — M545 Low back pain, unspecified: Secondary | ICD-10-CM

## 2012-09-22 NOTE — Discharge Summary (Addendum)
Physician Discharge Summary  Patient ID: Jocelyn Curtis MRN: [id number] DOB/AGE: [birth year] 66 y.o.  Admit date: 09/14/2012 Discharge date: 09/22/2012  Primary Care Physician:  Kaleen Mask, MD   Discharge Diagnoses:    Active Problems:  Weakness  Altered mental status  Polypharmacy  Encephalopathy acute  Hypotension  Anxiety  depression  Radiculopathy      Medication List     As of 09/22/2012 10:54 PM    STOP taking these medications         diazepam 5 MG tablet   Commonly known as: VALIUM      pentazocine-naloxone 50-0.5 MG per tablet   Commonly known as: TALWIN NX      TAKE these medications         aspirin EC 325 MG tablet   Take 325 mg by mouth daily.      clopidogrel 75 MG tablet   Commonly known as: PLAVIX   Take 75 mg by mouth daily.      lisinopril 20 MG tablet   Commonly known as: PRINIVIL,ZESTRIL   Take 20 mg by mouth daily.      pantoprazole 40 MG tablet   Commonly known as: PROTONIX   Take 40 mg by mouth daily.      PARoxetine 30 MG tablet   Commonly known as: PAXIL   Take 60 mg by mouth every morning.      pravastatin 40 MG tablet   Commonly known as: PRAVACHOL   Take 40 mg by mouth daily.      traMADol 50 MG tablet   Commonly known as: ULTRAM   Take 1 tablet (50 mg total) by mouth every 6 (six) hours as needed for pain.         Disposition and Follow-up:  PCP in 1 week  Consults: D/W Dr.Stewart Neurology regarding MRA by telephone  Significant Diagnostic Studies:  No results found.  Brief H and P: Presented with 3 weeks ago she had a back injury and was given prescription for Vicodin and valium. Her daughter gave her her own valium in attempt to save money. The strength of prescription was the same. Patient operantly took around 38 tablets with in 1 week and around this time started to have increased somnolence and confusion. When patient asked for more her daughter gave her 10 more tablets and locked the rest  away this was about 10 days ago. 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 Patient have been now more agitated when usual still confused.  She has had very hard time walking lately due to generalized weakness but while in ED now able to sit up on her own.     Hospital Course:  Altered mental status/Weakness  -Likely secondary to meds/polypharmacy, discussed with daughter who states she took about 38 valiums in 1week, pt admits this as well.  And also been taking talwin for back pain. No suicidal ideations.  -valium dc'ed, prn ativan stopped as well  -MRI negative for acute CVA or other abnormalities,Dr.Viyouh discussed MRA with Dr Roseanne Reno and he states with the veterbral artery occlusion no further recommendations as collaterals have most likely formed already.   Hypotension  -likely secondary to meds, improving on ivf, stable plan transfer to floor, held lisinopril initially then resumed   Chronic Back Pain  -d/c prn Talwin , tramadol PRN added  H/O CHF  -  stable     Time spent on Discharge:  Signed: Athziry Millican [facility]  09/22/2012, 10:54 PM

## 2012-09-23 ENCOUNTER — Other Ambulatory Visit: Payer: Medicare PPO

## 2012-09-30 ENCOUNTER — Ambulatory Visit
Admission: RE | Admit: 2012-09-30 | Discharge: 2012-09-30 | Disposition: A | Discharge status: Home / Self Care | Payer: Medicare PPO | Source: Ambulatory Visit | Attending: Family Medicine | Admitting: Family Medicine

## 2012-09-30 DIAGNOSIS — M545 Low back pain, unspecified: Secondary | ICD-10-CM

## 2012-11-07 ENCOUNTER — Other Ambulatory Visit: Payer: Self-pay | Admitting: Neurosurgery

## 2012-11-07 DIAGNOSIS — M79605 Pain in left leg: Secondary | ICD-10-CM

## 2012-11-07 DIAGNOSIS — R200 Anesthesia of skin: Secondary | ICD-10-CM

## 2012-11-07 DIAGNOSIS — M4800 Spinal stenosis, site unspecified: Secondary | ICD-10-CM

## 2012-11-10 ENCOUNTER — Other Ambulatory Visit: Payer: Self-pay | Admitting: Family Medicine

## 2012-11-10 DIAGNOSIS — N6310 Unspecified lump in the right breast, unspecified quadrant: Secondary | ICD-10-CM

## 2012-11-18 ENCOUNTER — Ambulatory Visit: Payer: Medicare PPO | Admitting: Vascular Surgery

## 2012-11-18 ENCOUNTER — Other Ambulatory Visit: Payer: Medicare PPO

## 2012-11-21 ENCOUNTER — Ambulatory Visit
Admission: RE | Admit: 2012-11-21 | Discharge: 2012-11-21 | Disposition: A | Discharge status: Home / Self Care | Payer: PRIVATE HEALTH INSURANCE | Source: Ambulatory Visit | Attending: Neurosurgery | Admitting: Neurosurgery

## 2012-11-21 DIAGNOSIS — M79605 Pain in left leg: Secondary | ICD-10-CM

## 2012-11-21 DIAGNOSIS — R200 Anesthesia of skin: Secondary | ICD-10-CM

## 2012-11-21 DIAGNOSIS — M4800 Spinal stenosis, site unspecified: Secondary | ICD-10-CM

## 2012-11-21 MED ORDER — METHYLPREDNISOLONE ACETATE 40 MG/ML INJ SUSP (RADIOLOG
120.0000 mg | Freq: Once | INTRAMUSCULAR | Status: AC
  Administered 2012-11-21: 120 mg via EPIDURAL

## 2012-11-21 MED ORDER — IOHEXOL 180 MG/ML  SOLN
1.0000 mL | Freq: Once | INTRAMUSCULAR | Status: AC | PRN
  Administered 2012-11-21: 1 mL via EPIDURAL

## 2012-12-01 ENCOUNTER — Encounter: Payer: Self-pay | Admitting: Vascular Surgery

## 2012-12-02 ENCOUNTER — Other Ambulatory Visit: Payer: Medicare PPO

## 2012-12-02 ENCOUNTER — Ambulatory Visit: Payer: Medicare PPO | Admitting: Vascular Surgery

## 2012-12-08 ENCOUNTER — Encounter: Payer: Self-pay | Admitting: Vascular Surgery

## 2012-12-09 ENCOUNTER — Encounter: Payer: Self-pay | Admitting: Vascular Surgery

## 2012-12-09 ENCOUNTER — Other Ambulatory Visit (INDEPENDENT_AMBULATORY_CARE_PROVIDER_SITE_OTHER): Payer: PRIVATE HEALTH INSURANCE | Admitting: *Deleted

## 2012-12-09 ENCOUNTER — Ambulatory Visit (INDEPENDENT_AMBULATORY_CARE_PROVIDER_SITE_OTHER): Payer: PRIVATE HEALTH INSURANCE | Admitting: Vascular Surgery

## 2012-12-09 DIAGNOSIS — Z48812 Encounter for surgical aftercare following surgery on the circulatory system: Secondary | ICD-10-CM

## 2012-12-09 DIAGNOSIS — I771 Stricture of artery: Secondary | ICD-10-CM

## 2012-12-09 DIAGNOSIS — G458 Other transient cerebral ischemic attacks and related syndromes: Secondary | ICD-10-CM

## 2012-12-09 DIAGNOSIS — I6529 Occlusion and stenosis of unspecified carotid artery: Secondary | ICD-10-CM

## 2012-12-09 NOTE — Progress Notes (Signed)
Subjective:     Patient ID: Jocelyn Curtis, female   DOB: 1945-11-13, 67 y.o.   MRN: [id number]  HPIthis 67 year old female is seen in followup regarding her left carotid subclavian anastomosis performed in August 2013 for subclavian steal syndrome. She had blurred vision and dizziness as well as some left upper extremity ischemia symptoms preoperatively all of which has resolved. She denies any lateralizing weakness, left arm claudication, syncope, blurred vision, or other neurologic symptoms.   Past Medical History  Diagnosis Date  . Hyperlipidemia     takes Pravastatin daily  . Anxiety and depression   . Osteoarthritis (arthritis due to wear and tear of joints)   . Abdominal aortic aneurysm without rupture     followed by Dr. Aggie Cosier  . Dizziness - giddy   . Cerebrovascular disease     pt states she pulls to the left and staggers alot  . Subclavian steal syndrome   . Coronary artery disease   . H/O blood clots     in right foot-04/24/11  . COPD (chronic obstructive pulmonary disease)   . Foot drop     right since knee replacement  . Chronic back pain   . Bruises easily   . H/O: GI bleed   . Blood transfusion 40yrs ago  . Diabetes mellitus     but controlled with diet and exercise  . Peripheral vascular disease     blood clot in foot following epidural anesthesia - 04/2011  . Arthritis     "all over body"  . Osteoarthritis   . Hypertension     takes  Lisinopril daily, Dr. Donnal Moat. took care of pt. before his retirement   . Complication of anesthesia     ENDED WITH BLOOD CLOTT IN LEG  . Stroke     ministrokes many yrs. ago   . CHF (congestive heart failure)     USED TO SEE DR GROVE  . Pneumonia     hx of;last time about 75yrs ago 2008  . Renal insufficiency     SEE MED DR Shelah [place]andow[place]  . Cancer     HX SKIN CA  . Depression     takes Paxil daily  . Subclavian steal syndrome   . Carotid artery occlusion   . AAA (abdominal aortic aneurysm)   . Shortness  of breath     with exertion  . Dizziness   . Vision loss     left eye  . GERD (gastroesophageal reflux disease)     takes Protonix daily  . History of bladder infections   . Dysrhythmia     IRREG HEARTBEATS    History  Substance Use Topics  . Smoking status: Current Some Day Smoker -- 0.25 packs/day for 48 years    Types: Cigarettes  . Smokeless tobacco: Current User    Types: Snuff     Comment: uses snuff every once in a while  . Alcohol Use: No     Comment: occ beer    Family History  Problem Relation Age of Onset  . Anesthesia problems Neg Hx   . Hypotension Neg Hx   . Malignant hyperthermia Neg Hx   . Pseudochol deficiency Neg Hx   . Diabetes Mother   . Heart disease Mother     before age 52  . Hyperlipidemia Mother   . Hypertension Mother   . Heart attack Mother   . Cancer Father   . Heart disease Father  before age 27  . Hyperlipidemia Father   . Hypertension Father   . Heart attack Father   . Deep vein thrombosis Sister   . Diabetes Sister   . Heart disease Sister   . Hyperlipidemia Sister   . Hypertension Sister   . Heart attack Sister   . Diabetes Brother   . Hypertension Brother   . Diabetes Daughter     Allergies  Allergen Reactions  . Nsaids Other (See Comments)    Gi bleed  . Codeine Nausea And Vomiting  . Penicillins Itching and Rash    Abdominal swelling    Current outpatient prescriptions:aspirin EC 325 MG tablet, Take 325 mg by mouth daily., Disp: , Rfl: ;  clopidogrel (PLAVIX) 75 MG tablet, Take 75 mg by mouth daily., Disp: , Rfl: ;  lisinopril (PRINIVIL,ZESTRIL) 20 MG tablet, Take 20 mg by mouth daily., Disp: , Rfl: ;  pantoprazole (PROTONIX) 40 MG tablet, Take 40 mg by mouth daily., Disp: , Rfl: ;  PARoxetine (PAXIL) 30 MG tablet, Take 60 mg by mouth every morning. , Disp: , Rfl:  pravastatin (PRAVACHOL) 40 MG tablet, Take 40 mg by mouth daily. , Disp: , Rfl: ;  traMADol (ULTRAM) 50 MG tablet, Take 1 tablet (50 mg total) by mouth  every 6 (six) hours as needed for pain., Disp: 30 tablet, Rfl: 0  BP 133/92  Pulse 97  Resp 18  Ht 5\' 8"  (1.727 m)  Wt 210 lb (95.255 kg)  BMI 31.94 kg/m2  Body mass index is 31.94 kg/(m^2).           Review of Systems Denies chest pain, dyspnea on exertion, PND, orthopnea. Does have bilateral lower extremity claudication symptoms and has lumbar spine problems which have required steroid injection in the past      Objective:   Physical Exam blood pressure systolic 134 the right 128 on the left heart rate 97 respirations 18 Gen.-alert and oriented x3 in no apparent distress HEENT normal for age Lungs no rhonchi or wheezing Cardiovascular regular rhythm no murmurs carotid pulses 3+ palpable no bruits audible Abdomen soft nontender no palpable masses Musculoskeletal free of  major deformities Skin clear -no rashes Neurologic normal Lower extremities 3+ femoral and dorsalis pedis pulses palpable bilaterally with no edema  Today ordered a duplex scan of the carotid arteries in the left carotid subclavian anastomosis which was found to be widely patent. Blood flows in the upper extremities are very similar 134 the right and 128 on the left. No evidence of significant internal carotid stenoses        Assessment:     Doing well post left carotid subclavian anastomosis performed for subclavian steal syndrome with posterior circulation symptoms-resolved     Plan:     Return in 6 months for followup duplex exam

## 2012-12-11 ENCOUNTER — Ambulatory Visit
Admission: RE | Admit: 2012-12-11 | Discharge: 2012-12-11 | Disposition: A | Discharge status: Home / Self Care | Payer: PRIVATE HEALTH INSURANCE | Source: Ambulatory Visit | Attending: Family Medicine | Admitting: Family Medicine

## 2012-12-11 DIAGNOSIS — N6310 Unspecified lump in the right breast, unspecified quadrant: Secondary | ICD-10-CM

## 2012-12-19 ENCOUNTER — Other Ambulatory Visit (HOSPITAL_COMMUNITY): Payer: Self-pay | Admitting: Neurology

## 2012-12-19 ENCOUNTER — Other Ambulatory Visit: Payer: Self-pay | Admitting: Neurosurgery

## 2012-12-19 DIAGNOSIS — M4800 Spinal stenosis, site unspecified: Secondary | ICD-10-CM

## 2012-12-19 DIAGNOSIS — M79604 Pain in right leg: Secondary | ICD-10-CM

## 2012-12-23 ENCOUNTER — Ambulatory Visit
Admission: RE | Admit: 2012-12-23 | Discharge: 2012-12-23 | Disposition: A | Discharge status: Home / Self Care | Payer: PRIVATE HEALTH INSURANCE | Source: Ambulatory Visit | Attending: Neurosurgery | Admitting: Neurosurgery

## 2012-12-23 DIAGNOSIS — M79604 Pain in right leg: Secondary | ICD-10-CM

## 2012-12-23 DIAGNOSIS — M4800 Spinal stenosis, site unspecified: Secondary | ICD-10-CM

## 2012-12-23 MED ORDER — METHYLPREDNISOLONE ACETATE 40 MG/ML INJ SUSP (RADIOLOG
120.0000 mg | Freq: Once | INTRAMUSCULAR | Status: AC
  Administered 2012-12-23: 120 mg via EPIDURAL

## 2012-12-23 MED ORDER — IOHEXOL 180 MG/ML  SOLN
1.0000 mL | Freq: Once | INTRAMUSCULAR | Status: AC | PRN
  Administered 2012-12-23: 1 mL via EPIDURAL

## 2013-01-02 ENCOUNTER — Other Ambulatory Visit: Payer: Self-pay | Admitting: Family Medicine

## 2013-01-02 DIAGNOSIS — N63 Unspecified lump in unspecified breast: Secondary | ICD-10-CM

## 2013-01-19 ENCOUNTER — Other Ambulatory Visit (HOSPITAL_COMMUNITY): Payer: Self-pay | Admitting: Neurology

## 2013-04-11 ENCOUNTER — Other Ambulatory Visit (HOSPITAL_COMMUNITY): Payer: Self-pay | Admitting: Neurology

## 2013-06-11 ENCOUNTER — Ambulatory Visit: Payer: PRIVATE HEALTH INSURANCE | Admitting: Family

## 2013-06-11 ENCOUNTER — Other Ambulatory Visit: Payer: PRIVATE HEALTH INSURANCE

## 2013-06-24 ENCOUNTER — Other Ambulatory Visit (INDEPENDENT_AMBULATORY_CARE_PROVIDER_SITE_OTHER): Payer: Medicare Other | Admitting: *Deleted

## 2013-06-24 ENCOUNTER — Other Ambulatory Visit: Payer: PRIVATE HEALTH INSURANCE

## 2013-06-24 ENCOUNTER — Ambulatory Visit
Admission: RE | Admit: 2013-06-24 | Discharge: 2013-06-24 | Disposition: A | Discharge status: Home / Self Care | Payer: Medicare Other | Source: Ambulatory Visit | Attending: Family Medicine | Admitting: Family Medicine

## 2013-06-24 DIAGNOSIS — Z48812 Encounter for surgical aftercare following surgery on the circulatory system: Secondary | ICD-10-CM

## 2013-06-24 DIAGNOSIS — I771 Stricture of artery: Secondary | ICD-10-CM

## 2013-06-24 DIAGNOSIS — N63 Unspecified lump in unspecified breast: Secondary | ICD-10-CM

## 2013-06-24 DIAGNOSIS — I6529 Occlusion and stenosis of unspecified carotid artery: Secondary | ICD-10-CM

## 2013-06-25 ENCOUNTER — Other Ambulatory Visit: Payer: Self-pay | Admitting: *Deleted

## 2013-06-29 ENCOUNTER — Encounter: Payer: Self-pay | Admitting: Vascular Surgery

## 2013-10-09 ENCOUNTER — Other Ambulatory Visit: Payer: Self-pay | Admitting: Gastroenterology

## 2013-10-09 DIAGNOSIS — R634 Abnormal weight loss: Secondary | ICD-10-CM

## 2013-10-09 DIAGNOSIS — R109 Unspecified abdominal pain: Secondary | ICD-10-CM

## 2013-10-15 ENCOUNTER — Other Ambulatory Visit: Payer: Medicare Other

## 2013-10-21 ENCOUNTER — Ambulatory Visit
Admission: RE | Admit: 2013-10-21 | Discharge: 2013-10-21 | Disposition: A | Discharge status: Home / Self Care | Payer: Medicare Other | Source: Ambulatory Visit | Attending: Gastroenterology | Admitting: Gastroenterology

## 2013-10-21 DIAGNOSIS — R109 Unspecified abdominal pain: Secondary | ICD-10-CM

## 2013-10-21 DIAGNOSIS — R634 Abnormal weight loss: Secondary | ICD-10-CM

## 2013-10-21 MED ORDER — IOHEXOL 300 MG/ML  SOLN
30.0000 mL | Freq: Once | INTRAMUSCULAR | Status: AC | PRN
  Administered 2013-10-21: 30 mL via ORAL

## 2013-10-21 MED ORDER — IOHEXOL 300 MG/ML  SOLN
100.0000 mL | Freq: Once | INTRAMUSCULAR | Status: AC | PRN
Start: 2013-10-21 — End: 2013-10-21
  Administered 2013-10-21: 100 mL via INTRAVENOUS

## 2013-10-28 ENCOUNTER — Other Ambulatory Visit: Payer: Self-pay | Admitting: Gastroenterology

## 2013-10-28 DIAGNOSIS — R109 Unspecified abdominal pain: Secondary | ICD-10-CM

## 2013-11-03 ENCOUNTER — Other Ambulatory Visit: Payer: Medicare Other

## 2013-11-11 ENCOUNTER — Inpatient Hospital Stay: Admission: RE | Admit: 2013-11-11 | Payer: Medicare Other | Source: Ambulatory Visit

## 2013-11-24 ENCOUNTER — Other Ambulatory Visit: Payer: Self-pay | Admitting: Vascular Surgery

## 2013-11-24 DIAGNOSIS — I6529 Occlusion and stenosis of unspecified carotid artery: Secondary | ICD-10-CM

## 2014-01-20 ENCOUNTER — Other Ambulatory Visit (HOSPITAL_COMMUNITY): Payer: Self-pay | Admitting: General Surgery

## 2014-01-20 ENCOUNTER — Encounter (HOSPITAL_BASED_OUTPATIENT_CLINIC_OR_DEPARTMENT_OTHER): Payer: Medicare Other | Attending: General Surgery

## 2014-01-20 ENCOUNTER — Ambulatory Visit (HOSPITAL_COMMUNITY)
Admission: RE | Admit: 2014-01-20 | Discharge: 2014-01-20 | Disposition: A | Discharge status: Home / Self Care | Payer: Medicare Other | Source: Ambulatory Visit | Attending: General Surgery | Admitting: General Surgery

## 2014-01-20 DIAGNOSIS — E1169 Type 2 diabetes mellitus with other specified complication: Secondary | ICD-10-CM | POA: Insufficient documentation

## 2014-01-20 DIAGNOSIS — Z8739 Personal history of other diseases of the musculoskeletal system and connective tissue: Secondary | ICD-10-CM | POA: Insufficient documentation

## 2014-01-20 DIAGNOSIS — E1142 Type 2 diabetes mellitus with diabetic polyneuropathy: Secondary | ICD-10-CM | POA: Insufficient documentation

## 2014-01-20 DIAGNOSIS — E1149 Type 2 diabetes mellitus with other diabetic neurological complication: Secondary | ICD-10-CM | POA: Insufficient documentation

## 2014-01-20 DIAGNOSIS — L97509 Non-pressure chronic ulcer of other part of unspecified foot with unspecified severity: Secondary | ICD-10-CM | POA: Insufficient documentation

## 2014-01-20 DIAGNOSIS — M869 Osteomyelitis, unspecified: Secondary | ICD-10-CM

## 2014-01-20 DIAGNOSIS — I499 Cardiac arrhythmia, unspecified: Secondary | ICD-10-CM | POA: Insufficient documentation

## 2014-01-20 DIAGNOSIS — I509 Heart failure, unspecified: Secondary | ICD-10-CM | POA: Insufficient documentation

## 2014-01-20 DIAGNOSIS — Z79899 Other long term (current) drug therapy: Secondary | ICD-10-CM | POA: Insufficient documentation

## 2014-01-20 DIAGNOSIS — I10 Essential (primary) hypertension: Secondary | ICD-10-CM | POA: Insufficient documentation

## 2014-01-22 NOTE — Progress Notes (Signed)
[facility]  NAME:  ZAILEE, VALLELY NO.:  MEDICAL RECORD NO.:  [id number]      DATE OF BIRTH:  May 21, 1946  PHYSICIAN:  Judene Companion, M.D.           VISIT DATE:                                  OFFICE VISIT   HISTORY:  She is a 68 year old type 2 diabetic with hypertension, who comes to [place] with a diabetic ulcer on the dorsal aspect of the PIP joint, her left second toe.  It is only about 5 mm in diameter, but I question whether there may be some bone involvement, so I ordered an x-ray of her toe.  We also after we scrubbed it up, we put some collagen on it and then some felt so that it was off-loading.  We put her on a toe sock. This lady has a history of hypertension, for which she takes Prinivil, depression she takes Paxil.  She is also on Protonix, Pravachol, Ultram, metformin and pentasoline.  She has a diagnosis in the past of cardiac arrhythmias, congestive heart failure, hypertension, and she has diabetic neuropathy of her feet.  She also has had a cataract removed from her left eye.  PHYSICAL EXAMINATION:  Her vital signs showed a blood pressure of 132/80, respirations 16, pulse 67, temperature 98.  She weighs 163 pounds and is 5 feet 7 inches.  DIAGNOSES:  Type 2 diabetes, with a diabetic ulcer Wagner 2 on the proximal interphalangeal joint second left toe.  Also diagnoses of hypertension, congestive heart failure, cardiac arrhythmias, and type 2 diabetes blood.     Judene Companion, M.D.     PP/MEDQ  D:  01/20/2014  T:  01/21/2014  Job:  [id number]

## 2014-01-29 ENCOUNTER — Encounter (HOSPITAL_BASED_OUTPATIENT_CLINIC_OR_DEPARTMENT_OTHER): Payer: Medicare Other | Attending: General Surgery

## 2014-01-29 DIAGNOSIS — E1169 Type 2 diabetes mellitus with other specified complication: Secondary | ICD-10-CM | POA: Insufficient documentation

## 2014-01-29 DIAGNOSIS — L97509 Non-pressure chronic ulcer of other part of unspecified foot with unspecified severity: Secondary | ICD-10-CM | POA: Insufficient documentation

## 2014-06-30 ENCOUNTER — Ambulatory Visit: Payer: Medicare Other | Admitting: Vascular Surgery

## 2014-06-30 ENCOUNTER — Other Ambulatory Visit (HOSPITAL_COMMUNITY): Payer: Medicare Other

## 2014-07-02 ENCOUNTER — Encounter: Payer: Self-pay | Admitting: Family

## 2014-07-05 ENCOUNTER — Ambulatory Visit: Payer: Medicare Other | Admitting: Family

## 2014-07-05 ENCOUNTER — Other Ambulatory Visit (HOSPITAL_COMMUNITY): Payer: Medicare Other

## 2014-08-16 ENCOUNTER — Encounter: Payer: Self-pay | Admitting: Family

## 2014-08-17 ENCOUNTER — Ambulatory Visit: Payer: Medicare Other | Admitting: Family

## 2014-08-17 ENCOUNTER — Other Ambulatory Visit (HOSPITAL_COMMUNITY): Payer: Medicare Other

## 2014-08-18 ENCOUNTER — Ambulatory Visit
Admission: RE | Admit: 2014-08-18 | Discharge: 2014-08-18 | Disposition: A | Discharge status: Home / Self Care | Payer: Medicare Other | Source: Ambulatory Visit | Attending: Family Medicine | Admitting: Family Medicine

## 2014-08-18 ENCOUNTER — Other Ambulatory Visit: Payer: Self-pay | Admitting: Family Medicine

## 2014-08-18 DIAGNOSIS — R059 Cough, unspecified: Secondary | ICD-10-CM

## 2014-08-18 DIAGNOSIS — R05 Cough: Secondary | ICD-10-CM

## 2015-10-18 ENCOUNTER — Encounter: Payer: Self-pay | Admitting: Family

## 2015-10-25 ENCOUNTER — Other Ambulatory Visit: Payer: Self-pay | Admitting: *Deleted

## 2015-10-25 DIAGNOSIS — G458 Other transient cerebral ischemic attacks and related syndromes: Secondary | ICD-10-CM

## 2015-10-25 DIAGNOSIS — Z48812 Encounter for surgical aftercare following surgery on the circulatory system: Secondary | ICD-10-CM

## 2015-10-26 ENCOUNTER — Ambulatory Visit (INDEPENDENT_AMBULATORY_CARE_PROVIDER_SITE_OTHER): Payer: Medicare Other | Admitting: Family

## 2015-10-26 ENCOUNTER — Ambulatory Visit (HOSPITAL_COMMUNITY)
Admission: RE | Admit: 2015-10-26 | Discharge: 2015-10-26 | Disposition: A | Discharge status: Home / Self Care | Payer: Medicare Other | Source: Ambulatory Visit | Attending: Family | Admitting: Family

## 2015-10-26 ENCOUNTER — Encounter: Payer: Self-pay | Admitting: Family

## 2015-10-26 VITALS — BP 153/88 | HR 78 | Temp 98.4°F | Resp 18 | Ht 67.5 in | Wt 214.0 lb

## 2015-10-26 DIAGNOSIS — E785 Hyperlipidemia, unspecified: Secondary | ICD-10-CM | POA: Diagnosis not present

## 2015-10-26 DIAGNOSIS — G458 Other transient cerebral ischemic attacks and related syndromes: Secondary | ICD-10-CM | POA: Insufficient documentation

## 2015-10-26 DIAGNOSIS — I10 Essential (primary) hypertension: Secondary | ICD-10-CM | POA: Diagnosis not present

## 2015-10-26 DIAGNOSIS — Z48812 Encounter for surgical aftercare following surgery on the circulatory system: Secondary | ICD-10-CM | POA: Insufficient documentation

## 2015-10-26 DIAGNOSIS — I872 Venous insufficiency (chronic) (peripheral): Secondary | ICD-10-CM | POA: Diagnosis not present

## 2015-10-26 DIAGNOSIS — I6523 Occlusion and stenosis of bilateral carotid arteries: Secondary | ICD-10-CM | POA: Insufficient documentation

## 2015-10-26 DIAGNOSIS — Z87440 Personal history of urinary (tract) infections: Secondary | ICD-10-CM | POA: Diagnosis not present

## 2015-10-26 DIAGNOSIS — Z720 Tobacco use: Secondary | ICD-10-CM

## 2015-10-26 DIAGNOSIS — F17200 Nicotine dependence, unspecified, uncomplicated: Secondary | ICD-10-CM

## 2015-10-26 DIAGNOSIS — Z8679 Personal history of other diseases of the circulatory system: Secondary | ICD-10-CM

## 2015-10-26 DIAGNOSIS — E119 Type 2 diabetes mellitus without complications: Secondary | ICD-10-CM | POA: Insufficient documentation

## 2015-10-26 NOTE — Patient Instructions (Addendum)
Stroke Prevention Some medical conditions and behaviors are associated with an increased chance of having a stroke. You may prevent a stroke by making healthy choices and managing medical conditions. HOW CAN I REDUCE MY RISK OF HAVING A STROKE?   Stay physically active. Get at least 30 minutes of activity on most or all days.  Do not smoke. It may also be helpful to avoid exposure to secondhand smoke.  Limit alcohol use. Moderate alcohol use is considered to be:  No more than 2 drinks per day for men.  No more than 1 drink per day for nonpregnant women.  Eat healthy foods. This involves:  Eating 5 or more servings of fruits and vegetables a day.  Making dietary changes that address high blood pressure (hypertension), high cholesterol, diabetes, or obesity.  Manage your cholesterol levels.  Making food choices that are high in fiber and low in saturated fat, trans fat, and cholesterol may control cholesterol levels.  Take any prescribed medicines to control cholesterol as directed by your health care provider.  Manage your diabetes.  Controlling your carbohydrate and sugar intake is recommended to manage diabetes.  Take any prescribed medicines to control diabetes as directed by your health care provider.  Control your hypertension.  Making food choices that are low in salt (sodium), saturated fat, trans fat, and cholesterol is recommended to manage hypertension.  Ask your health care provider if you need treatment to lower your blood pressure. Take any prescribed medicines to control hypertension as directed by your health care provider.  If you are 18-39 years of age, have your blood pressure checked every 3-5 years. If you are 40 years of age or older, have your blood pressure checked every year.  Maintain a healthy weight.  Reducing calorie intake and making food choices that are low in sodium, saturated fat, trans fat, and cholesterol are recommended to manage  weight.  Stop drug abuse.  Avoid taking birth control pills.  Talk to your health care provider about the risks of taking birth control pills if you are over 35 years old, smoke, get migraines, or have ever had a blood clot.  Get evaluated for sleep disorders (sleep apnea).  Talk to your health care provider about getting a sleep evaluation if you snore a lot or have excessive sleepiness.  Take medicines only as directed by your health care provider.  For some people, aspirin or blood thinners (anticoagulants) are helpful in reducing the risk of forming abnormal blood clots that can lead to stroke. If you have the irregular heart rhythm of atrial fibrillation, you should be on a blood thinner unless there is a good reason you cannot take them.  Understand all your medicine instructions.  Make sure that other conditions (such as anemia or atherosclerosis) are addressed. SEEK IMMEDIATE MEDICAL CARE IF:   You have sudden weakness or numbness of the face, arm, or leg, especially on one side of the body.  Your face or eyelid droops to one side.  You have sudden confusion.  You have trouble speaking (aphasia) or understanding.  You have sudden trouble seeing in one or both eyes.  You have sudden trouble walking.  You have dizziness.  You have a loss of balance or coordination.  You have a sudden, severe headache with no known cause.  You have new chest pain or an irregular heartbeat. Any of these symptoms may represent a serious problem that is an emergency. Do not wait to see if the symptoms will   go away. Get medical help at once. Call your local emergency services (911 in [place] Do not drive yourself to the hospital.   This information is not intended to replace advice given to you by your health care provider. Make sure you discuss any questions you have with your health care provider.   Document Released: 10/25/2004 Document Revised: 10/08/2014 Document Reviewed:  03/20/2013 Elsevier Interactive Patient Education 2016 Elsevier Inc.    Smoking Cessation, Tips for Success If you are ready to quit smoking, congratulations! You have chosen to help yourself be healthier. Cigarettes bring nicotine, tar, carbon monoxide, and other irritants into your body. Your lungs, heart, and blood vessels will be able to work better without these poisons. There are many different ways to quit smoking. Nicotine gum, nicotine patches, a nicotine inhaler, or nicotine nasal spray can help with physical craving. Hypnosis, support groups, and medicines help break the habit of smoking. WHAT THINGS CAN I DO TO MAKE QUITTING EASIER?  Here are some tips to help you quit for good:  Pick a date when you will quit smoking completely. Tell all of your friends and family about your plan to quit on that date.  Do not try to slowly cut down on the number of cigarettes you are smoking. Pick a quit date and quit smoking completely starting on that day.  Throw away all cigarettes.   Clean and remove all ashtrays from your home, work, and car.  On a card, write down your reasons for quitting. Carry the card with you and read it when you get the urge to smoke.  Cleanse your body of nicotine. Drink enough water and fluids to keep your urine clear or pale yellow. Do this after quitting to flush the nicotine from your body.  Learn to predict your moods. Do not let a bad situation be your excuse to have a cigarette. Some situations in your life might tempt you into wanting a cigarette.  Never have "just one" cigarette. It leads to wanting another and another. Remind yourself of your decision to quit.  Change habits associated with smoking. If you smoked while driving or when feeling stressed, try other activities to replace smoking. Stand up when drinking your coffee. Brush your teeth after eating. Sit in a different chair when you read the paper. Avoid alcohol while trying to quit, and try to  drink fewer caffeinated beverages. Alcohol and caffeine may urge you to smoke.  Avoid foods and drinks that can trigger a desire to smoke, such as sugary or spicy foods and alcohol.  Ask people who smoke not to smoke around you.  Have something planned to do right after eating or having a cup of coffee. For example, plan to take a walk or exercise.  Try a relaxation exercise to calm you down and decrease your stress. Remember, you may be tense and nervous for the first 2 weeks after you quit, but this will pass.  Find new activities to keep your hands busy. Play with a pen, coin, or rubber band. Doodle or draw things on paper.  Brush your teeth right after eating. This will help cut down on the craving for the taste of tobacco after meals. You can also try mouthwash.   Use oral substitutes in place of cigarettes. Try using lemon drops, carrots, cinnamon sticks, or chewing gum. Keep them handy so they are available when you have the urge to smoke.  When you have the urge to smoke, try deep breathing.    Designate your home as a nonsmoking area.  If you are a heavy smoker, ask your health care provider about a prescription for nicotine chewing gum. It can ease your withdrawal from nicotine.  Reward yourself. Set aside the cigarette money you save and buy yourself something nice.  Look for support from others. Join a support group or smoking cessation program. Ask someone at home or at work to help you with your plan to quit smoking.  Always ask yourself, "Do I need this cigarette or is this just a reflex?" Tell yourself, "Today, I choose not to smoke," or "I do not want to smoke." You are reminding yourself of your decision to quit.  Do not replace cigarette smoking with electronic cigarettes (commonly called e-cigarettes). The safety of e-cigarettes is unknown, and some may contain harmful chemicals.  If you relapse, do not give up! Plan ahead and think about what you will do the next  time you get the urge to smoke. HOW WILL I FEEL WHEN I QUIT SMOKING? You may have symptoms of withdrawal because your body is used to nicotine (the addictive substance in cigarettes). You may crave cigarettes, be irritable, feel very hungry, cough often, get headaches, or have difficulty concentrating. The withdrawal symptoms are only temporary. They are strongest when you first quit but will go away within 10-14 days. When withdrawal symptoms occur, stay in control. Think about your reasons for quitting. Remind yourself that these are signs that your body is healing and getting used to being without cigarettes. Remember that withdrawal symptoms are easier to treat than the major diseases that smoking can cause.  Even after the withdrawal is over, expect periodic urges to smoke. However, these cravings are generally short lived and will go away whether you smoke or not. Do not smoke! WHAT RESOURCES ARE AVAILABLE TO HELP ME QUIT SMOKING? Your health care provider can direct you to community resources or hospitals for support, which may include:  Group support.  Education.  Hypnosis.  Therapy.   This information is not intended to replace advice given to you by your health care provider. Make sure you discuss any questions you have with your health care provider.   Document Released: 06/15/2004 Document Revised: 10/08/2014 Document Reviewed: 03/05/2013 Elsevier Interactive Patient Education 2016 [employer].    Steps to Quit Smoking  Smoking tobacco can be harmful to your health and can affect almost every organ in your body. Smoking puts you, and those around you, at risk for developing many serious chronic diseases. Quitting smoking is difficult, but it is one of the best things that you can do for your health. It is never too late to quit. WHAT ARE THE BENEFITS OF QUITTING SMOKING? When you quit smoking, you lower your risk of developing serious diseases and conditions, such as:  Lung  cancer or lung disease, such as COPD.  Heart disease.  Stroke.  Heart attack.  Infertility.  Osteoporosis and bone fractures. Additionally, symptoms such as coughing, wheezing, and shortness of breath may get better when you quit. You may also find that you get sick less often because your body is stronger at fighting off colds and infections. If you are pregnant, quitting smoking can help to reduce your chances of having a baby of low birth weight. HOW DO I GET READY TO QUIT? When you decide to quit smoking, create a plan to make sure that you are successful. Before you quit:  Pick a date to quit. Set a date within  the next two weeks to give you time to prepare.  Write down the reasons why you are quitting. Keep this list in places where you will see it often, such as on your bathroom mirror or in your car or wallet.  Identify the people, places, things, and activities that make you want to smoke (triggers) and avoid them. Make sure to take these actions:  Throw away all cigarettes at home, at work, and in your car.  Throw away smoking accessories, such as Scientist, research (medical).  Clean your car and make sure to empty the ashtray.  Clean your home, including curtains and carpets.  Tell your family, friends, and coworkers that you are quitting. Support from your loved ones can make quitting easier.  Talk with your health care provider about your options for quitting smoking.  Find out what treatment options are covered by your health insurance. WHAT STRATEGIES CAN I USE TO QUIT SMOKING?  Talk with your healthcare provider about different strategies to quit smoking. Some strategies include:  Quitting smoking altogether instead of gradually lessening how much you smoke over a period of time. Research shows that quitting "cold [place]" is more successful than gradually quitting.  Attending in-person counseling to help you build problem-solving skills. You are more likely to have  success in quitting if you attend several counseling sessions. Even short sessions of 10 minutes can be effective.  Finding resources and support systems that can help you to quit smoking and remain smoke-free after you quit. These resources are most helpful when you use them often. They can include:  Online chats with a Social worker.  Telephone quitlines.  Printed Furniture conservator/restorer.  Support groups or group counseling.  Text messaging programs.  Mobile phone applications.  Taking medicines to help you quit smoking. (If you are pregnant or breastfeeding, talk with your health care provider first.) Some medicines contain nicotine and some do not. Both types of medicines help with cravings, but the medicines that include nicotine help to relieve withdrawal symptoms. Your health care provider may recommend:  Nicotine patches, gum, or lozenges.  Nicotine inhalers or sprays.  Non-nicotine medicine that is taken by mouth. Talk with your health care provider about combining strategies, such as taking medicines while you are also receiving in-person counseling. Using these two strategies together makes you more likely to succeed in quitting than if you used either strategy on its own. If you are pregnant or breastfeeding, talk with your health care provider about finding counseling or other support strategies to quit smoking. Do not take medicine to help you quit smoking unless told to do so by your health care provider. WHAT THINGS CAN I DO TO MAKE IT EASIER TO QUIT? Quitting smoking might feel overwhelming at first, but there is a lot that you can do to make it easier. Take these important actions:  Reach out to your family and friends and ask that they support and encourage you during this time. Call telephone quitlines, reach out to support groups, or work with a counselor for support.  Ask people who smoke to avoid smoking around you.  Avoid places that trigger you to smoke, such as bars,  parties, or smoke-break areas at work.  Spend time around people who do not smoke.  Lessen stress in your life, because stress can be a smoking trigger for some people. To lessen stress, try:  Exercising regularly.  Deep-breathing exercises.  Yoga.  Meditating.  Performing a body scan. This involves closing your eyes,  scanning your body from head to toe, and noticing which parts of your body are particularly tense. Purposefully relax the muscles in those areas.  Download or purchase mobile phone or tablet apps (applications) that can help you stick to your quit plan by providing reminders, tips, and encouragement. There are many free apps, such as QuitGuide from the [employer] Office manager for Disease Control and Prevention). You can find other support for quitting smoking (smoking cessation) through smokefree.gov and other websites. HOW WILL I FEEL WHEN I QUIT SMOKING? Within the first 24 hours of quitting smoking, you may start to feel some withdrawal symptoms. These symptoms are usually most noticeable 2-3 days after quitting, but they usually do not last beyond 2-3 weeks. Changes or symptoms that you might experience include:  Mood swings.  Restlessness, anxiety, or irritation.  Difficulty concentrating.  Dizziness.  Strong cravings for sugary foods in addition to nicotine.  Mild weight gain.  Constipation.  Nausea.  Coughing or a sore throat.  Changes in how your medicines work in your body.  A depressed mood.  Difficulty sleeping (insomnia). After the first 2-3 weeks of quitting, you may start to notice more positive results, such as:  Improved sense of smell and taste.  Decreased coughing and sore throat.  Slower heart rate.  Lower blood pressure.  Clearer skin.  The ability to breathe more easily.  Fewer sick days. Quitting smoking is very challenging for most people. Do not get discouraged if you are not successful the first time. Some people need to make many  attempts to quit before they achieve long-term success. Do your best to stick to your quit plan, and talk with your health care provider if you have any questions or concerns.   This information is not intended to replace advice given to you by your health care provider. Make sure you discuss any questions you have with your health care provider.   Document Released: 09/11/2001 Document Revised: 02/01/2015 Document Reviewed: 02/01/2015 Elsevier Interactive Patient Education 2016 Elsevier Inc.    Venous Stasis or Chronic Venous Insufficiency Chronic venous insufficiency, also called venous stasis, is a condition that affects the veins in the legs. The condition prevents blood from being pumped through these veins effectively. Blood may no longer be pumped effectively from the legs back to the heart. This condition can range from mild to severe. With proper treatment, you should be able to continue with an active life. CAUSES  Chronic venous insufficiency occurs when the vein walls become stretched, weakened, or damaged or when valves within the vein are damaged. Some common causes of this include:  High blood pressure inside the veins (venous hypertension).  Increased blood pressure in the leg veins from long periods of sitting or standing.  A blood clot that blocks blood flow in a vein (deep vein thrombosis).  Inflammation of a superficial vein (phlebitis) that causes a blood clot to form. RISK FACTORS Various things can make you more likely to develop chronic venous insufficiency, including:  Family history of this condition.  Obesity.  Pregnancy.  Sedentary lifestyle.  Smoking.  Jobs requiring long periods of standing or sitting in one place.  Being a certain age. Women in their 45s and 69s and men in their 22s are more likely to develop this condition. SIGNS AND SYMPTOMS  Symptoms may include:   Varicose veins.  Skin breakdown or ulcers.  Reddened or discolored skin on  the leg.  Brown, smooth, tight, and painful skin just above the ankle,  usually on the inside surface (lipodermatosclerosis).  Swelling. DIAGNOSIS  To diagnose this condition, your health care provider will take a medical history and do a physical exam. The following tests may be ordered to confirm the diagnosis:  Duplex ultrasound--A procedure that produces a picture of a blood vessel and nearby organs and also provides information on blood flow through the blood vessel.  Plethysmography--A procedure that tests blood flow.  A venogram, or venography--A procedure used to look at the veins using X-ray and dye. TREATMENT The goals of treatment are to help you return to an active life and to minimize pain or disability. Treatment will depend on the severity of the condition. Medical procedures may be needed for severe cases. Treatment options may include:   Use of compression stockings. These can help with symptoms and lower the chances of the problem getting worse, but they do not cure the problem.  Sclerotherapy--A procedure involving an injection of a material that "dissolves" the damaged veins. Other veins in the network of blood vessels take over the function of the damaged veins.  Surgery to remove the vein or cut off blood flow through the vein (vein stripping or laser ablation surgery).  Surgery to repair a valve. HOME CARE INSTRUCTIONS   Wear compression stockings as directed by your health care provider.  Only take over-the-counter or prescription medicines for pain, discomfort, or fever as directed by your health care provider.  Follow up with your health care provider as directed. SEEK MEDICAL CARE IF:   You have redness, swelling, or increasing pain in the affected area.  You see a red streak or line that extends up or down from the affected area.  You have a breakdown or loss of skin in the affected area, even if the breakdown is small.  You have an injury to the  affected area. SEEK IMMEDIATE MEDICAL CARE IF:   You have an injury and open wound in the affected area.  Your pain is severe and does not improve with medicine.  You have sudden numbness or weakness in the foot or ankle below the affected area, or you have trouble moving your foot or ankle.  You have a fever or persistent symptoms for more than 2-3 days.  You have a fever and your symptoms suddenly get worse. MAKE SURE YOU:   Understand these instructions.  Will watch your condition.  Will get help right away if you are not doing well or get worse.   This information is not intended to replace advice given to you by your health care provider. Make sure you discuss any questions you have with your health care provider.   Document Released: 01/21/2007 Document Revised: 07/08/2013 Document Reviewed: 05/25/2013 Elsevier Interactive Patient Education [employer].

## 2015-10-26 NOTE — Progress Notes (Signed)
Chief Complaint: Extracranial Carotid Artery Stenosis   History of Present Illness  Jocelyn Curtis is a 70 y.o. female patient of Dr. 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 She had blurred vision and dizziness as well as some left upper extremity ischemia symptoms preoperatively all of which has resolved.   Daughter states pt slowness in mentation is typical of when she has a UTI.  Pt states she has been going in and out of a-fib, states she has had this before, is no longer seeing a cardiologist for unclear reasons, states she was seeing Dr. Estanislado Pandy.  She also states she had been having TIA's before the left carotid to subclavian artery bypass, none subsequently.    She describes right hip/leg radiculopathy.  Pt states she has OA.  Pt Diabetic: yes, does not know what her A1C is, no results on file since 2013, pt states her FBS is usually 100-150 Pt smoker: smoker  (1 ppd, started at age 58 yrs)  Pt meds include: Statin : yes ASA: yes Other anticoagulants/antiplatelets: pt states Dr. Estanislado Pandy stopped Plavix and put her on ASA.   Past Medical History  Diagnosis Date  . Hyperlipidemia     takes Pravastatin daily  . Anxiety and depression   . Osteoarthritis (arthritis due to wear and tear of joints)   . Abdominal aortic aneurysm without rupture [facility])     followed by Dr. Concepcion Elk  . Dizziness - giddy   . Cerebrovascular disease     pt states she pulls to the left and staggers alot  . Subclavian steal syndrome   . Coronary artery disease   . H/O blood clots     in right foot-04/24/11  . COPD (chronic obstructive pulmonary disease) ([place])   . Foot drop     right since knee replacement  . Chronic back pain   . Bruises easily   . H/O: GI bleed   . Blood transfusion 10yrs ago  . Diabetes mellitus     but controlled with diet and exercise  . Peripheral vascular disease ([place])      blood clot in foot following epidural anesthesia - 04/2011  . Arthritis     "all over body"  . Osteoarthritis   . Hypertension     takes  Lisinopril daily, Dr. Ricci Barker. took care of pt. before his retirement   . Complication of anesthesia     ENDED WITH BLOOD CLOTT IN LEG  . Stroke [facility])     ministrokes many yrs. ago   . CHF (congestive heart failure) ([place])     USED TO SEE DR GROVE  . Pneumonia     hx of;last time about 68yrs ago 2008  . Renal insufficiency     SEE MED DR [place] Flakes [place]  . Cancer ([place])     HX SKIN CA  . Depression     takes Paxil daily  . Subclavian steal syndrome   . Carotid artery occlusion   . AAA (abdominal aortic aneurysm) ([place])   . Shortness of breath     with exertion  . Dizziness   . Vision loss     left eye  . GERD (gastroesophageal reflux disease)     takes Protonix daily  . History of bladder infections   . Dysrhythmia     IRREG HEARTBEATS    Social History Social History  Substance Use Topics  . Smoking  status: Current Some Day Smoker -- 0.25 packs/day for 48 years    Types: Cigarettes  . Smokeless tobacco: Current User    Types: Snuff     Comment: uses snuff every once in a while  . Alcohol Use: No     Comment: occ beer    Family History Family History  Problem Relation Age of Onset  . Anesthesia problems Neg Hx   . Hypotension Neg Hx   . Malignant hyperthermia Neg Hx   . Pseudochol deficiency Neg Hx   . Diabetes Mother   . Heart disease Mother     before age 22  . Hyperlipidemia Mother   . Hypertension Mother   . Heart attack Mother   . Cancer Father   . Heart disease Father     before age 42  . Hyperlipidemia Father   . Hypertension Father   . Heart attack Father   . Deep vein thrombosis Sister   . Diabetes Sister   . Heart disease Sister   . Hyperlipidemia Sister   . Hypertension Sister   . Heart attack Sister   . Diabetes Brother   . Hypertension Brother   . Diabetes Daughter     Surgical  History Past Surgical History  Procedure Laterality Date  . Rotator cuff repair      bilateral  . Appendectomy    . Vaginal hysterectomy    . Hand surgery      right  . Perineal abscess i & d      necrotizing soft tissue infection 2005  . Nasal sinus surgery    . Carpal tunnel release    . Foot surgery      left  . Cosmetic surgery    . Cardiac catheterization    . Joint replacement      bilateral total knee replacement  . Carotid-subclavian bypass graft  05/02/2012    Procedure: BYPASS GRAFT CAROTID-SUBCLAVIAN;  Surgeon: Mal Misty, MD;  Location: [place];  Service: Vascular;  Laterality: Left;  . Endarterectomy  05/02/2012    Procedure: ENDARTERECTOMY SUBCLAVIAN;  Surgeon: Mal Misty, MD;  Location: [place];  Service: Vascular;  Laterality: Left;    Allergies  Allergen Reactions  . Metformin And Related Anaphylaxis  . Nsaids Other (See Comments)    Gi bleed  . Codeine Nausea And Vomiting  . Lisinopril Swelling and Rash  . Penicillins Itching and Rash    Abdominal swelling    Current Outpatient Prescriptions  Medication Sig Dispense Refill  . aspirin EC 325 MG tablet Take 325 mg by mouth daily.    Marland Kitchen atorvastatin (LIPITOR) 80 MG tablet Take 80 mg by mouth daily.    . METHYLPHENIDATE HCL ER, XR, PO Take by mouth. Patient does not know dose    . pantoprazole (PROTONIX) 40 MG tablet Take 40 mg by mouth daily.    Marland Kitchen PARoxetine (PAXIL) 30 MG tablet Take 60 mg by mouth every morning.     . pravastatin (PRAVACHOL) 40 MG tablet Take 40 mg by mouth daily.     . clopidogrel (PLAVIX) 75 MG tablet TAKE 1 TABLET ONCE DAILY (Patient not taking: Reported on 10/26/2015) 7 tablet 0  . lisinopril (PRINIVIL,ZESTRIL) 20 MG tablet Take 20 mg by mouth daily. Reported on 10/26/2015    . traMADol (ULTRAM) 50 MG tablet Take 1 tablet (50 mg total) by mouth every 6 (six) hours as needed for pain. (Patient not taking: Reported on 10/26/2015) 30 tablet 0  No current facility-administered medications  for this visit.    Review of Systems : See HPI for pertinent positives and negatives.  Physical Examination  Filed Vitals:   10/26/15 1556 10/26/15 1606 10/26/15 1607  BP: 150/86 151/90 153/88  Pulse: 80 78 78  Temp: 98.4 F (36.9 C)    Resp: 18    Height: 5' 7.5" (1.715 m)    Weight: 214 lb (97.07 kg)    SpO2: 95%     Body mass index is 33 kg/(m^2).  General: WDWN obese female in NAD GAIT: normal Eyes: PERRLA Pulmonary:  Non-labored, CTAB, no rales,  rhonchi, or wheezing. Occasional moist cough.  Cardiac: regular rhythm, no detected murmur.  VASCULAR EXAM Carotid Bruits Right Left   Negative Negative    Aorta is not palpable. Radial pulses are 2+ palpable and equal.                                                                                                                            LE Pulses Right Left       FEMORAL   palpable   palpable        POPLITEAL  not palpable   not palpable       POSTERIOR TIBIAL  not palpable   not palpable        DORSALIS PEDIS      ANTERIOR TIBIAL 2+ palpable  2+ palpable     Gastrointestinal: soft, nontender, BS WNL, no r/g,  no palpable masses.  Musculoskeletal: Age appropriate muscle atrophy/wasting. M/S 5/5 throughout, extremities without ischemic changes. Lower legs with 1+ pitting edema and multiple small varicosities. Mild kyphosis.   Neurologic: A&O X 3; distant affect, Speech is rambling, CN 2-12 intact, pain and light touch intact in extremities, Motor exam as listed above.   Non-Invasive Vascular Imaging CAROTID DUPLEX 10/26/2015   CEREBROVASCULAR DUPLEX EVALUATION    INDICATION: Carotid artery disease    PREVIOUS INTERVENTION(S): Left carotid artery to subclavian artery anastomosis bypass 05/02/12    DUPLEX EXAM: Carotid duplex    RIGHT  LEFT  Peak Systolic Velocities (cm/s) End Diastolic Velocities (cm/s) Plaque LOCATION Peak Systolic Velocities (cm/s) End Diastolic Velocities (cm/s) Plaque  106 10  CCA  PROXIMAL 97 21   82 22  CCA MID 105 31   90 28 HT CCA DISTAL 100 28 HT  73 12 HT ECA 105 17 HT  76 20 HT ICA PROXIMAL 104 28 HT  84 31  ICA MID 77 26   60 23  ICA DISTAL 63 26     .92 ICA / CCA Ratio (PSV) N/A  Antegrade Vertebral Flow Antegrade  - Brachial Systolic Pressure (mmHg) -  Triphasic Brachial Artery Waveforms Triphasic    Plaque Morphology:  HM = Homogeneous, HT = Heterogeneous, CP = Calcific Plaque, SP = Smooth Plaque, IP = Irregular Plaque     ADDITIONAL FINDINGS: Multiphasic subclavian arteries    IMPRESSION: 1. Less than 40%  bilateral internal carotid artery stenosis, with a widely patent left carotid/subclavian bypass    Compared to the previous exam:  No change since prior exam      Assessment: Jocelyn Curtis is a 70 y.o. female who is s/p left carotid artery to subclavian artery anastomosis bypass on 05/02/12 for left UE steal symptoms which have resolved. She had TIA's prior to this surgery but none subsequently. Today's carotid duplex suggests less than 40% bilateral internal carotid artery stenosis, with a widely patent left carotid/subclavian bypass. No change since prior exam.   Her atherosclerotic risk factors include DM and smoking.  Chronic venous insufficiency: combined with CHF. She has 1+ pitting and non pitting edema in both lower legs with multiple small varicosities. She c/o feeling swelling in her legs. She and her daughter were instructed in adequate elevation of her legs and use of knee high graduated compression hose, 20-30 mm Hg. .   I advised pt to see her PCP tomorrow or call her PCP's office to be evaluated as soon as possible for possible recurrent UTI and atrial fib. Heart rhythm is regular at this time but she feels runs of irregular rhythm at times. Pt is not on any anticoagulants, she takes a daily ASA.  Face to face time with patient was 25 minutes. Over 50% of this time was spent on counseling and coordination of  care.   Plan:  The patient was counseled re smoking cessation and given several free resources re smoking cessation.  Follow-up in 1 year with Carotid Duplex scan.   I discussed in depth with the patient the nature of atherosclerosis, and emphasized the importance of maximal medical management including strict control of blood pressure, blood glucose, and lipid levels, obtaining regular exercise, and cessation of smoking.  The patient is aware that without maximal medical management the underlying atherosclerotic disease process will progress, limiting the benefit of any interventions. The patient was given information about stroke prevention and what symptoms should prompt the patient to seek immediate medical care. Thank you for allowing [place] to participate in this patient's care.  Clemon Chambers, RN, MSN, FNP-C [facility] Office: [phone]  Clinic Physician: Scot Dock  10/26/2015 4:26 PM

## 2015-10-26 NOTE — Progress Notes (Signed)
Filed Vitals:   10/26/15 1556 10/26/15 1606 10/26/15 1607  BP: 150/86 151/90 153/88  Pulse: 80 78 78  Temp: 98.4 F (36.9 C)    Resp: 18    Height: 5' 7.5" (1.715 m)    Weight: 214 lb (97.07 kg)    SpO2: 95%

## 2015-10-27 NOTE — Addendum Note (Signed)
Addended by: Dorthula Rue L on: 10/27/2015 09:05 AM   Modules accepted: Orders

## 2016-10-03 DIAGNOSIS — G8929 Other chronic pain: Secondary | ICD-10-CM | POA: Diagnosis not present

## 2016-10-09 DIAGNOSIS — J069 Acute upper respiratory infection, unspecified: Secondary | ICD-10-CM | POA: Diagnosis not present

## 2016-10-18 ENCOUNTER — Encounter: Payer: Self-pay | Admitting: Family

## 2016-10-25 ENCOUNTER — Encounter (HOSPITAL_COMMUNITY): Payer: Medicare Other

## 2016-10-25 ENCOUNTER — Ambulatory Visit: Payer: Medicare Other | Admitting: Family

## 2016-10-26 DIAGNOSIS — M7752 Other enthesopathy of left foot: Secondary | ICD-10-CM | POA: Diagnosis not present

## 2016-10-26 DIAGNOSIS — M79672 Pain in left foot: Secondary | ICD-10-CM | POA: Diagnosis not present

## 2016-10-26 DIAGNOSIS — M2042 Other hammer toe(s) (acquired), left foot: Secondary | ICD-10-CM | POA: Diagnosis not present

## 2016-10-26 DIAGNOSIS — M25572 Pain in left ankle and joints of left foot: Secondary | ICD-10-CM | POA: Diagnosis not present

## 2016-11-05 DIAGNOSIS — G8929 Other chronic pain: Secondary | ICD-10-CM | POA: Diagnosis not present

## 2016-11-05 DIAGNOSIS — Z79891 Long term (current) use of opiate analgesic: Secondary | ICD-10-CM | POA: Diagnosis not present

## 2016-11-14 DIAGNOSIS — M722 Plantar fascial fibromatosis: Secondary | ICD-10-CM | POA: Diagnosis not present

## 2016-11-14 DIAGNOSIS — M25572 Pain in left ankle and joints of left foot: Secondary | ICD-10-CM | POA: Diagnosis not present

## 2016-11-14 DIAGNOSIS — M2042 Other hammer toe(s) (acquired), left foot: Secondary | ICD-10-CM | POA: Diagnosis not present

## 2016-11-14 DIAGNOSIS — E119 Type 2 diabetes mellitus without complications: Secondary | ICD-10-CM | POA: Diagnosis not present

## 2016-11-27 DIAGNOSIS — M2042 Other hammer toe(s) (acquired), left foot: Secondary | ICD-10-CM | POA: Diagnosis not present

## 2016-11-27 DIAGNOSIS — M25572 Pain in left ankle and joints of left foot: Secondary | ICD-10-CM | POA: Diagnosis not present

## 2016-11-30 DIAGNOSIS — M25572 Pain in left ankle and joints of left foot: Secondary | ICD-10-CM | POA: Diagnosis not present

## 2016-12-03 DIAGNOSIS — G8929 Other chronic pain: Secondary | ICD-10-CM | POA: Diagnosis not present

## 2016-12-03 DIAGNOSIS — E119 Type 2 diabetes mellitus without complications: Secondary | ICD-10-CM | POA: Diagnosis not present

## 2016-12-12 DIAGNOSIS — M25572 Pain in left ankle and joints of left foot: Secondary | ICD-10-CM | POA: Diagnosis not present

## 2016-12-26 DIAGNOSIS — M25572 Pain in left ankle and joints of left foot: Secondary | ICD-10-CM | POA: Diagnosis not present

## 2017-01-02 DIAGNOSIS — G8929 Other chronic pain: Secondary | ICD-10-CM | POA: Diagnosis not present

## 2017-01-30 DIAGNOSIS — G8929 Other chronic pain: Secondary | ICD-10-CM | POA: Diagnosis not present

## 2017-03-06 DIAGNOSIS — E1140 Type 2 diabetes mellitus with diabetic neuropathy, unspecified: Secondary | ICD-10-CM | POA: Diagnosis not present

## 2017-03-06 DIAGNOSIS — N289 Disorder of kidney and ureter, unspecified: Secondary | ICD-10-CM | POA: Diagnosis not present

## 2017-03-06 DIAGNOSIS — E785 Hyperlipidemia, unspecified: Secondary | ICD-10-CM | POA: Diagnosis not present

## 2017-03-06 DIAGNOSIS — I10 Essential (primary) hypertension: Secondary | ICD-10-CM | POA: Diagnosis not present

## 2017-03-06 DIAGNOSIS — E119 Type 2 diabetes mellitus without complications: Secondary | ICD-10-CM | POA: Diagnosis not present

## 2017-03-06 DIAGNOSIS — M545 Low back pain: Secondary | ICD-10-CM | POA: Diagnosis not present

## 2017-03-06 DIAGNOSIS — G8929 Other chronic pain: Secondary | ICD-10-CM | POA: Diagnosis not present

## 2017-03-06 DIAGNOSIS — N189 Chronic kidney disease, unspecified: Secondary | ICD-10-CM | POA: Diagnosis not present

## 2017-04-22 DIAGNOSIS — M545 Low back pain: Secondary | ICD-10-CM | POA: Diagnosis not present

## 2017-05-21 DIAGNOSIS — M545 Low back pain: Secondary | ICD-10-CM | POA: Diagnosis not present

## 2017-05-21 DIAGNOSIS — M542 Cervicalgia: Secondary | ICD-10-CM | POA: Diagnosis not present

## 2017-06-04 ENCOUNTER — Other Ambulatory Visit: Payer: Self-pay | Admitting: Neurological Surgery

## 2017-06-04 DIAGNOSIS — Z23 Encounter for immunization: Secondary | ICD-10-CM | POA: Diagnosis not present

## 2017-06-04 DIAGNOSIS — E669 Obesity, unspecified: Secondary | ICD-10-CM | POA: Diagnosis not present

## 2017-06-04 DIAGNOSIS — M48062 Spinal stenosis, lumbar region with neurogenic claudication: Secondary | ICD-10-CM | POA: Diagnosis not present

## 2017-06-04 DIAGNOSIS — Z6831 Body mass index (BMI) 31.0-31.9, adult: Secondary | ICD-10-CM | POA: Diagnosis not present

## 2017-06-04 DIAGNOSIS — M549 Dorsalgia, unspecified: Secondary | ICD-10-CM | POA: Diagnosis not present

## 2017-06-04 DIAGNOSIS — G8929 Other chronic pain: Secondary | ICD-10-CM | POA: Diagnosis not present

## 2017-06-06 ENCOUNTER — Other Ambulatory Visit: Payer: Self-pay | Admitting: Neurological Surgery

## 2017-06-06 DIAGNOSIS — M48062 Spinal stenosis, lumbar region with neurogenic claudication: Secondary | ICD-10-CM

## 2017-06-12 ENCOUNTER — Ambulatory Visit
Admission: RE | Admit: 2017-06-12 | Discharge: 2017-06-12 | Disposition: A | Discharge status: Home / Self Care | Payer: Medicare HMO | Source: Ambulatory Visit | Attending: Neurological Surgery | Admitting: Neurological Surgery

## 2017-06-12 DIAGNOSIS — M48062 Spinal stenosis, lumbar region with neurogenic claudication: Secondary | ICD-10-CM

## 2017-06-12 DIAGNOSIS — Z86718 Personal history of other venous thrombosis and embolism: Secondary | ICD-10-CM | POA: Diagnosis not present

## 2017-06-12 DIAGNOSIS — Z4589 Encounter for adjustment and management of other implanted devices: Secondary | ICD-10-CM | POA: Diagnosis not present

## 2017-06-12 HISTORY — PX: IR RADIOLOGIST EVAL & MGMT: IMG5224

## 2017-06-12 NOTE — Consult Note (Signed)
Chief Complaint: Patient was seen in consultation today for  Chief Complaint  Patient presents with  . Advice Only    Consult for IVC filter placement   at the request of Jones,David S  Referring Physician(s): Jones,David S  History of Present Illness: Jocelyn Curtis is a 71 y.o. female with multiple medical problems including chronic debilitating back pain. She is set to undergo multilevel posterior spinal fusion surgery on 06/20/2017. She does have a history of prior lower extremity DVT which occurred following a total knee replacement. She presents today at the kind request of Dr. Ronnald Ramp to discuss possible filter placement prior to surgery to serve as PE prophylaxis in the event that she develops recurrent DVT.  Currently, she denies chest pain, shortness of breath or significant lower extremity swelling.  Past Medical History:  Diagnosis Date  . AAA (abdominal aortic aneurysm) ([place])   . Abdominal aortic aneurysm without rupture [facility])    followed by Dr. Concepcion Elk  . Anxiety and depression   . Arthritis    "all over body"  . Blood transfusion 62yrs ago  . Bruises easily   . Cancer ([place])    HX SKIN CA  . Carotid artery occlusion   . Cerebrovascular disease    pt states she pulls to the left and staggers alot  . CHF (congestive heart failure) ([place])    USED TO SEE DR GROVE  . Chronic back pain   . Complication of anesthesia    ENDED WITH BLOOD CLOTT IN LEG  . COPD (chronic obstructive pulmonary disease) ([place])   . Coronary artery disease   . Depression    takes Paxil daily  . Diabetes mellitus    but controlled with diet and exercise  . Dizziness   . Dizziness - giddy   . Dysrhythmia    IRREG HEARTBEATS  . Foot drop    right since knee replacement  . GERD (gastroesophageal reflux disease)    takes Protonix daily  . H/O blood clots    in right foot-04/24/11  . H/O: GI bleed   . History of bladder infections   . Hyperlipidemia    takes Pravastatin daily    . Hypertension    takes  Lisinopril daily, Dr. Ricci Barker. took care of pt. before his retirement   . Osteoarthritis   . Osteoarthritis (arthritis due to wear and tear of joints)   . Peripheral vascular disease ([place])    blood clot in foot following epidural anesthesia - 04/2011  . Pneumonia    hx of;last time about 83yrs ago 2008  . Renal insufficiency    SEE MED DR [place] Flakes [place]  . Shortness of breath    with exertion  . Stroke [facility])    ministrokes many yrs. ago   . Subclavian steal syndrome   . Subclavian steal syndrome   . Vision loss    left eye    Past Surgical History:  Procedure Laterality Date  . APPENDECTOMY    . CARDIAC CATHETERIZATION    . CAROTID-SUBCLAVIAN BYPASS GRAFT  05/02/2012   Procedure: BYPASS GRAFT CAROTID-SUBCLAVIAN;  Surgeon: Mal Misty, MD;  Location: [place];  Service: Vascular;  Laterality: Left;  . CARPAL TUNNEL RELEASE    . COSMETIC SURGERY    . ENDARTERECTOMY  05/02/2012   Procedure: ENDARTERECTOMY SUBCLAVIAN;  Surgeon: Mal Misty, MD;  Location: [place];  Service: Vascular;  Laterality: Left;  . FOOT SURGERY     left  .  HAND SURGERY     right  . JOINT REPLACEMENT     bilateral total knee replacement  . NASAL SINUS SURGERY    . perineal abscess I & D     necrotizing soft tissue infection 2005  . ROTATOR CUFF REPAIR     bilateral  . VAGINAL HYSTERECTOMY      Allergies: Metformin and related; Nsaids; Codeine; Lisinopril; and Penicillins  Medications: Prior to Admission medications   Medication Sig Start Date End Date Taking? Authorizing Provider  aspirin 81 MG chewable tablet Chew 81 mg by mouth daily.   Yes [provider]  atorvastatin (LIPITOR) 80 MG tablet Take 80 mg by mouth daily.   Yes [provider]  glimepiride (AMARYL) 4 MG tablet Take 4 mg by mouth daily with breakfast. Take 2 tablets every morning.   Yes [provider]  oxyCODONE-acetaminophen (PERCOCET) 10-325 MG tablet Take 1 tablet by  mouth every 4 (four) hours as needed for pain.   Yes [provider]  pantoprazole (PROTONIX) 40 MG tablet Take 40 mg by mouth daily.   Yes [provider]  PARoxetine (PAXIL) 30 MG tablet Take 60 mg by mouth every morning.    Yes [provider]  traZODone (DESYREL) 150 MG tablet Take 150 mg by mouth at bedtime.   Yes [provider]  aspirin EC 325 MG tablet Take 325 mg by mouth daily.    [provider]  clopidogrel (PLAVIX) 75 MG tablet TAKE 1 TABLET ONCE DAILY Patient not taking: Reported on 10/26/2015 04/11/13   Kathrynn Ducking, MD  lisinopril (PRINIVIL,ZESTRIL) 20 MG tablet Take 20 mg by mouth daily. Reported on 10/26/2015    [provider]  METHYLPHENIDATE HCL ER, XR, PO Take by mouth. Patient does not know dose    [provider]  pravastatin (PRAVACHOL) 40 MG tablet Take 40 mg by mouth daily.     [provider]  traMADol (ULTRAM) 50 MG tablet Take 1 tablet (50 mg total) by mouth every 6 (six) hours as needed for pain. Patient not taking: Reported on 10/26/2015 09/18/12   Domenic Polite, MD     Family History  Problem Relation Age of Onset  . Anesthesia problems Neg Hx   . Hypotension Neg Hx   . Malignant hyperthermia Neg Hx   . Pseudochol deficiency Neg Hx   . Diabetes Mother   . Heart disease Mother        before age 51  . Hyperlipidemia Mother   . Hypertension Mother   . Heart attack Mother   . Cancer Father   . Heart disease Father        before age 88  . Hyperlipidemia Father   . Hypertension Father   . Heart attack Father   . Deep vein thrombosis Sister   . Diabetes Sister   . Heart disease Sister   . Hyperlipidemia Sister   . Hypertension Sister   . Heart attack Sister   . Diabetes Brother   . Hypertension Brother   . Diabetes Daughter     Social History   Social History  . Marital status: Widowed    Spouse name: N/A  . Number of children: N/A  . Years of education: N/A   Social  History Main Topics  . Smoking status: Current Some Day Smoker    Packs/day: 0.25    Years: 48.00    Types: Cigarettes  . Smokeless tobacco: Current User    Types: Snuff  Comment: uses snuff every once in a while  . Alcohol use No     Comment: occ beer  . Drug use: No  . Sexual activity: Yes    Birth control/ protection: Surgical   Other Topics Concern  . Not on file   Social History Narrative  . No narrative on file     Review of Systems: A 12 point ROS discussed and pertinent positives are indicated in the HPI above.  All other systems are negative.  Review of Systems  Vital Signs: BP (!) 120/58   Pulse 75   Temp 97.8 F (36.6 C) (Oral)   Resp 14   Ht 5\' 8"  (1.727 m)   Wt 199 lb (90.3 kg)   SpO2 93%   BMI 30.26 kg/m   Physical Exam Constitutional: She is oriented to person, place, and time. She appears well-developed and well-nourished. No distress.  HENT:  Head: Normocephalic and atraumatic.  Eyes: No scleral icterus.  Cardiovascular: Normal rate and regular rhythm.   Pulmonary/Chest: Effort normal.  Abdominal: Soft. She exhibits no distension. There is no tenderness.  Neurological: She is alert and oriented to person, place, and time.  Skin: Skin is warm and dry.  Psychiatric: She has a normal mood and affect. Her behavior is normal.  Nursing note and vitals reviewed.   Imaging: No results found.  Labs:  CBC: No results for input(s): WBC, HGB, HCT, PLT in the last 8760 hours.  COAGS: No results for input(s): INR, APTT in the last 8760 hours.  BMP: No results for input(s): NA, K, CL, CO2, GLUCOSE, BUN, CALCIUM, CREATININE, GFRNONAA, GFRAA in the last 8760 hours.  Invalid input(s): CMP  LIVER FUNCTION TESTS: No results for input(s): BILITOT, AST, ALT, ALKPHOS, PROT, ALBUMIN in the last 8760 hours.  TUMOR MARKERS: No results for input(s): AFPTM, CEA, CA199, CHROMGRNA in the last 8760 hours.  Assessment and Plan:  71 year old female with  chronic back pain set to undergo multilevel posterior spinal fusion on 06/20/2017. She has a history of prior DVT following knee replacement surgery and is at risk for recurrent DVT. We discussed a plan of placing a retrievable IVC filter prior to the surgery and then bringing her back to have the filter removed what she has recovered and resumed normal activity.  She understands the risks, benefits and alternatives and desires to proceed.  1.) Schedule for placement of IVC filter ASAP. The filter must be in place prior to 06/20/2017. Procedure can be performed at either [facility] or [facility] and may be performed by me or anyone of my partners to facilitate scheduling.  Thank you for this interesting consult.  I greatly enjoyed meeting Jocelyn Curtis and look forward to participating in their care.  A copy of this report was sent to the requesting provider on this date.  Electronically Signed: Jacqulynn Cadet 06/12/2017, 12:52 PM   I spent a total of  40 Minutes   in face to face in clinical consultation, greater than 50% of which was counseling/coordinating care for history of DVT.

## 2017-06-13 ENCOUNTER — Other Ambulatory Visit (HOSPITAL_COMMUNITY): Payer: Self-pay | Admitting: Interventional Radiology

## 2017-06-13 ENCOUNTER — Other Ambulatory Visit: Payer: Self-pay | Admitting: Radiology

## 2017-06-13 DIAGNOSIS — Z86718 Personal history of other venous thrombosis and embolism: Secondary | ICD-10-CM

## 2017-06-14 ENCOUNTER — Other Ambulatory Visit: Payer: Self-pay | Admitting: General Surgery

## 2017-06-14 DIAGNOSIS — Z7984 Long term (current) use of oral hypoglycemic drugs: Secondary | ICD-10-CM | POA: Diagnosis not present

## 2017-06-14 DIAGNOSIS — E669 Obesity, unspecified: Secondary | ICD-10-CM | POA: Diagnosis not present

## 2017-06-14 DIAGNOSIS — E119 Type 2 diabetes mellitus without complications: Secondary | ICD-10-CM | POA: Diagnosis not present

## 2017-06-14 DIAGNOSIS — Z6831 Body mass index (BMI) 31.0-31.9, adult: Secondary | ICD-10-CM | POA: Diagnosis not present

## 2017-06-14 DIAGNOSIS — Z79891 Long term (current) use of opiate analgesic: Secondary | ICD-10-CM | POA: Diagnosis not present

## 2017-06-14 DIAGNOSIS — M48062 Spinal stenosis, lumbar region with neurogenic claudication: Secondary | ICD-10-CM | POA: Diagnosis not present

## 2017-06-17 ENCOUNTER — Ambulatory Visit (HOSPITAL_COMMUNITY)
Admission: RE | Admit: 2017-06-17 | Discharge: 2017-06-17 | Disposition: A | Discharge status: Home / Self Care | Payer: Medicare HMO | Source: Ambulatory Visit | Attending: Interventional Radiology | Admitting: Interventional Radiology

## 2017-06-17 ENCOUNTER — Other Ambulatory Visit (HOSPITAL_COMMUNITY): Payer: Self-pay | Admitting: Interventional Radiology

## 2017-06-17 ENCOUNTER — Encounter (HOSPITAL_COMMUNITY): Payer: Self-pay

## 2017-06-17 DIAGNOSIS — F17210 Nicotine dependence, cigarettes, uncomplicated: Secondary | ICD-10-CM | POA: Insufficient documentation

## 2017-06-17 DIAGNOSIS — K219 Gastro-esophageal reflux disease without esophagitis: Secondary | ICD-10-CM | POA: Insufficient documentation

## 2017-06-17 DIAGNOSIS — Z95828 Presence of other vascular implants and grafts: Secondary | ICD-10-CM | POA: Diagnosis not present

## 2017-06-17 DIAGNOSIS — Z8673 Personal history of transient ischemic attack (TIA), and cerebral infarction without residual deficits: Secondary | ICD-10-CM | POA: Insufficient documentation

## 2017-06-17 DIAGNOSIS — Z86718 Personal history of other venous thrombosis and embolism: Secondary | ICD-10-CM | POA: Diagnosis not present

## 2017-06-17 DIAGNOSIS — F419 Anxiety disorder, unspecified: Secondary | ICD-10-CM | POA: Diagnosis not present

## 2017-06-17 DIAGNOSIS — I6529 Occlusion and stenosis of unspecified carotid artery: Secondary | ICD-10-CM | POA: Insufficient documentation

## 2017-06-17 DIAGNOSIS — F329 Major depressive disorder, single episode, unspecified: Secondary | ICD-10-CM | POA: Insufficient documentation

## 2017-06-17 DIAGNOSIS — Z408 Encounter for other prophylactic surgery: Secondary | ICD-10-CM | POA: Insufficient documentation

## 2017-06-17 DIAGNOSIS — M549 Dorsalgia, unspecified: Secondary | ICD-10-CM | POA: Insufficient documentation

## 2017-06-17 DIAGNOSIS — M1990 Unspecified osteoarthritis, unspecified site: Secondary | ICD-10-CM | POA: Diagnosis not present

## 2017-06-17 DIAGNOSIS — R69 Illness, unspecified: Secondary | ICD-10-CM | POA: Diagnosis not present

## 2017-06-17 DIAGNOSIS — I679 Cerebrovascular disease, unspecified: Secondary | ICD-10-CM | POA: Insufficient documentation

## 2017-06-17 DIAGNOSIS — Z7982 Long term (current) use of aspirin: Secondary | ICD-10-CM | POA: Insufficient documentation

## 2017-06-17 DIAGNOSIS — Z8249 Family history of ischemic heart disease and other diseases of the circulatory system: Secondary | ICD-10-CM | POA: Diagnosis not present

## 2017-06-17 DIAGNOSIS — I2510 Atherosclerotic heart disease of native coronary artery without angina pectoris: Secondary | ICD-10-CM | POA: Diagnosis not present

## 2017-06-17 DIAGNOSIS — I509 Heart failure, unspecified: Secondary | ICD-10-CM | POA: Insufficient documentation

## 2017-06-17 DIAGNOSIS — J449 Chronic obstructive pulmonary disease, unspecified: Secondary | ICD-10-CM | POA: Insufficient documentation

## 2017-06-17 DIAGNOSIS — I714 Abdominal aortic aneurysm, without rupture: Secondary | ICD-10-CM | POA: Insufficient documentation

## 2017-06-17 DIAGNOSIS — M21371 Foot drop, right foot: Secondary | ICD-10-CM | POA: Diagnosis not present

## 2017-06-17 DIAGNOSIS — Z8679 Personal history of other diseases of the circulatory system: Secondary | ICD-10-CM | POA: Diagnosis not present

## 2017-06-17 DIAGNOSIS — E785 Hyperlipidemia, unspecified: Secondary | ICD-10-CM | POA: Insufficient documentation

## 2017-06-17 DIAGNOSIS — Z880 Allergy status to penicillin: Secondary | ICD-10-CM | POA: Diagnosis not present

## 2017-06-17 DIAGNOSIS — E1151 Type 2 diabetes mellitus with diabetic peripheral angiopathy without gangrene: Secondary | ICD-10-CM | POA: Diagnosis not present

## 2017-06-17 DIAGNOSIS — G8929 Other chronic pain: Secondary | ICD-10-CM | POA: Diagnosis not present

## 2017-06-17 DIAGNOSIS — Z7984 Long term (current) use of oral hypoglycemic drugs: Secondary | ICD-10-CM | POA: Diagnosis not present

## 2017-06-17 HISTORY — PX: IR US GUIDE VASC ACCESS RIGHT: IMG2390

## 2017-06-17 HISTORY — PX: IR IVC FILTER PLMT / S&I /IMG GUID/MOD SED: IMG701

## 2017-06-17 LAB — BASIC METABOLIC PANEL
Anion gap: 7 (ref 5–15)
BUN: 25 mg/dL — AB (ref 6–20)
CO2: 29 mmol/L (ref 22–32)
CREATININE: 1.65 mg/dL — AB (ref 0.44–1.00)
Calcium: 9.2 mg/dL (ref 8.9–10.3)
Chloride: 102 mmol/L (ref 101–111)
GFR calc Af Amer: 35 mL/min — ABNORMAL LOW (ref >60)
GFR calc non Af Amer: 30 mL/min — ABNORMAL LOW (ref >60)
GLUCOSE: 155 mg/dL — AB (ref 65–99)
Potassium: 4 mmol/L (ref 3.5–5.1)
Sodium: 138 mmol/L (ref 135–145)

## 2017-06-17 LAB — CBC
HCT: 43.5 % (ref 36.0–46.0)
Hemoglobin: 14.2 g/dL (ref 12.0–15.0)
MCH: 32.5 pg (ref 26.0–34.0)
MCHC: 32.6 g/dL (ref 30.0–36.0)
MCV: 99.5 fL (ref 78.0–100.0)
PLATELETS: 189 10*3/uL (ref 150–400)
RBC: 4.37 MIL/uL (ref 3.87–5.11)
RDW: 14.1 % (ref 11.5–15.5)
WBC: 9.7 10*3/uL (ref 4.0–10.5)

## 2017-06-17 LAB — APTT: aPTT: 32 seconds (ref 24–36)

## 2017-06-17 LAB — GLUCOSE, CAPILLARY: Glucose-Capillary: 152 mg/dL — ABNORMAL HIGH (ref 65–99)

## 2017-06-17 LAB — PROTIME-INR
INR: 0.89
PROTHROMBIN TIME: 12 s (ref 11.4–15.2)

## 2017-06-17 MED ORDER — FENTANYL CITRATE (PF) 100 MCG/2ML IJ SOLN
INTRAMUSCULAR | Status: AC | PRN
  Administered 2017-06-17: 50 ug via INTRAVENOUS

## 2017-06-17 MED ORDER — LIDOCAINE HCL (PF) 1 % IJ SOLN
INTRAMUSCULAR | Status: AC
  Filled 2017-06-17: qty 30

## 2017-06-17 MED ORDER — LIDOCAINE HCL (PF) 1 % IJ SOLN
INTRAMUSCULAR | Status: AC | PRN
  Administered 2017-06-17: 10 mL

## 2017-06-17 MED ORDER — FENTANYL CITRATE (PF) 100 MCG/2ML IJ SOLN
INTRAMUSCULAR | Status: AC
  Filled 2017-06-17: qty 2

## 2017-06-17 MED ORDER — MIDAZOLAM HCL 2 MG/2ML IJ SOLN
INTRAMUSCULAR | Status: AC
  Filled 2017-06-17: qty 4

## 2017-06-17 MED ORDER — IOPAMIDOL (ISOVUE-300) INJECTION 61%
INTRAVENOUS | Status: AC
Start: 2017-06-17 — End: 2017-06-17
  Administered 2017-06-17: 30 mL
  Filled 2017-06-17: qty 150

## 2017-06-17 MED ORDER — SODIUM CHLORIDE 0.9 % IV SOLN: INTRAVENOUS | Status: DC

## 2017-06-17 MED ORDER — OXYCODONE-ACETAMINOPHEN 5-325 MG PO TABS
1.0000 | ORAL_TABLET | ORAL | Status: DC | PRN
  Administered 2017-06-17: 2 via ORAL
  Filled 2017-06-17: qty 2

## 2017-06-17 MED ORDER — SODIUM CHLORIDE 0.9 % IV SOLN
INTRAVENOUS | Status: AC | PRN
  Administered 2017-06-17: 10 mL/h via INTRAVENOUS

## 2017-06-17 MED ORDER — OXYCODONE-ACETAMINOPHEN 5-325 MG PO TABS
ORAL_TABLET | ORAL | Status: AC
  Administered 2017-06-17: 2 via ORAL
  Filled 2017-06-17: qty 2

## 2017-06-17 MED ORDER — MIDAZOLAM HCL 2 MG/2ML IJ SOLN
INTRAMUSCULAR | Status: AC | PRN
  Administered 2017-06-17: 1 mg via INTRAVENOUS

## 2017-06-17 NOTE — Procedures (Signed)
  Procedure:   IVC filter (retrievable) Preprocedure diagnosis:  Preop LSp surg prophylaxis Postprocedure diagnosis:  same EBL:     minimal Complications:   none immediate  See full dictation in [employer].  Dillard Cannon MD Main # [phone] Pager  [phone]

## 2017-06-17 NOTE — Discharge Instructions (Addendum)
Inferior Vena Cava Filter Insertion, Care After °This sheet gives you information about how to care for yourself after your procedure. Your health care provider may also give you more specific instructions. If you have problems or questions, contact your health care provider. °What can I expect after the procedure? °After your procedure, it is common to have: °· Mild pain in the area where the filter was inserted. °· Mild bruising in the area where the filter was inserted. ° °Follow these instructions at home: °Insertion site care °· Follow instructions from your health care provider about how to take care of the site where a catheter was inserted at your neck or groin (insertion site). Make sure you: °? Wash your hands with soap and water before you change your bandage (dressing). If soap and water are not available, use hand sanitizer. °? Change your dressing as told by your health care provider. °· Check your insertion site every day for signs of infection. Check for: °? More redness, swelling, or pain. °? More fluid or blood. °? Warmth. °? Pus or a bad smell. °· Keep the insertion site clean and dry. °· Do not shower, bathe, use a hot tub, or let the dressing get wet until your health care provider approves. °General instructions °· Take over-the-counter and prescription medicines only as told by your health care provider. °· Avoid heavy lifting or hard activities for 48 hours after the procedure or as told by your health care provider. °· Do not drive for 24 hours if you were given a a medicine to help you relax (sedative). °· Do not drive or use heavy machinery while taking prescription pain medicine. °· Do not go back to school or work until your health care provider approves. °· Keep all follow-up visits as told by your health care provider. This is important. °Contact a health care provider if: °· You have more redness, swelling, or pain around your insertion site. °· You have more fluid or blood coming  from your insertion site. °· Your insertion site feels warm to the touch. °· You have pus or a bad smell coming from your insertion site. °· You have a fever. °· You are dizzy. °· You have nausea and vomiting. °· You develop a rash. °Get help right away if: °· You develop chest pain, a cough, or difficulty breathing. °· You develop shortness of breath, feel faint, or pass out. °· You cough up blood. °· You have severe pain in your abdomen. °· You develop swelling and discoloration or pain in your legs. °· Your legs become pale and cold or blue. °· You develop weakness, difficulty moving your arms or legs, or balance problems. °· You develop problems with speech or vision. °These symptoms may represent a serious problem that is an emergency. Do not wait to see if the symptoms will go away. Get medical help right away. Call your local emergency services (911 in the [place] Do not drive yourself to the hospital. °Summary °· After your insertion procedure, it is common to have mild pain and bruising. °· Do not shower, bathe, use a hot tub, or let the dressing get wet until your health care provider approves. °· Every day, check for signs of infection where a catheter was inserted at your neck or groin (insertion site). °This information is not intended to replace advice given to you by your health care provider. Make sure you discuss any questions you have with your health care provider. °Document Released: 07/08/2013 Document   Revised: 08/08/2016 Document Reviewed: 08/08/2016 °Elsevier Interactive Patient Education © 2017 [employer] °Moderate Conscious Sedation, Adult, Care After °These instructions provide you with information about caring for yourself after your procedure. Your health care provider may also give you more specific instructions. Your treatment has been planned according to current medical practices, but problems sometimes occur. Call your health care provider if you have any problems or questions  after your procedure. °What can I expect after the procedure? °After your procedure, it is common: °· To feel sleepy for several hours. °· To feel clumsy and have poor balance for several hours. °· To have poor judgment for several hours. °· To vomit if you eat too soon. ° °Follow these instructions at home: °For at least 24 hours after the procedure: ° °· Do not: °? Participate in activities where you could fall or become injured. °? Drive. °? Use heavy machinery. °? Drink alcohol. °? Take sleeping pills or medicines that cause drowsiness. °? Make important decisions or sign legal documents. °? Take care of children on your own. °· Rest. °Eating and drinking °· Follow the diet recommended by your health care provider. °· If you vomit: °? Drink water, juice, or soup when you can drink without vomiting. °? Make sure you have little or no nausea before eating solid foods. °General instructions °· Have a responsible adult stay with you until you are awake and alert. °· Take over-the-counter and prescription medicines only as told by your health care provider. °· If you smoke, do not smoke without supervision. °· Keep all follow-up visits as told by your health care provider. This is important. °Contact a health care provider if: °· You keep feeling nauseous or you keep vomiting. °· You feel light-headed. °· You develop a rash. °· You have a fever. °Get help right away if: °· You have trouble breathing. °This information is not intended to replace advice given to you by your health care provider. Make sure you discuss any questions you have with your health care provider. °Document Released: 07/08/2013 Document Revised: 02/20/2016 Document Reviewed: 01/07/2016 °Elsevier Interactive Patient Education © 2018 Elsevier Inc. ° °

## 2017-06-17 NOTE — Sedation Documentation (Addendum)
Having back pain 8-9/10. Percocet is ordered and Short Stay aware.

## 2017-06-17 NOTE — Sedation Documentation (Signed)
Still having back pain 8/10.Awaiting transport

## 2017-06-17 NOTE — Pre-Procedure Instructions (Signed)
ICEY TELLO  06/17/2017      [id number] [place] [place] [address] [place] [address] Phone: [phone] Fax: [phone]  [id number] [place], [address] [place]  [address] Phone: [phone] Fax: [phone]  [id number] [place], [address] [place] [address] Phone: [phone] Fax: [phone]    Your procedure is scheduled on June 20, 2017.  Report to [facility] Admitting at 9:30 A.M.  Call this number if you have problems the morning of surgery:  [phone]  Call [phone] if you have any questions prior to your surgery date Monday-Friday 8am-4pm   Remember:  Do not eat food or drink liquids after midnight.  Take these medicines the morning of surgery with A SIP OF WATER Pantoprazole (Protonix), Paroxetine (Paxil)  7 days prior to surgery STOP taking any Aleve, Naproxen, Ibuprofen, Motrin, Advil, Goody's, BC's, all herbal medications, fish oil, and all vitamins.   Do not wear jewelry, make-up or nail polish.  Do not wear lotions, powders, or perfumes, or deodorant.  Do not shave 48 hours prior to surgery.    Do not bring valuables to the hospital.   [facility] is not responsible for any belongings or valuables.  Contacts, dentures or bridgework may not be worn into surgery.  Leave your suitcase in the car.  After surgery it may be brought to your room.  For patients admitted to the hospital, discharge time will be determined by your treatment team.  Patients discharged the day of surgery will not be allowed to drive home.   Special instructions:   [place]- Preparing For Surgery  Before surgery, you can play an important role. Because skin is not sterile, your skin needs to be as free of germs as possible. You can reduce the number of germs on your skin by washing with CHG  (chlorahexidine gluconate) Soap before surgery.  CHG is an antiseptic cleaner which kills germs and bonds with the skin to continue killing germs even after washing.  Please do not use if you have an allergy to CHG or antibacterial soaps. If your skin becomes reddened/irritated stop using the CHG.  Do not shave (including legs and underarms) for at least 48 hours prior to first CHG shower. It is OK to shave your face.  Please follow these instructions carefully.   1. Shower the NIGHT BEFORE SURGERY and the MORNING OF SURGERY with CHG.   2. If you chose to wash your hair, wash your hair first as usual with your normal shampoo.  3. After you shampoo, rinse your hair and body thoroughly to remove the shampoo.  4. Use CHG as you would any other liquid soap. You can apply CHG directly to the skin and wash gently with a scrungie or a clean washcloth.   5. Apply the CHG Soap to your body ONLY FROM THE NECK DOWN.  Do not use on open wounds or open sores. Avoid contact with your eyes, ears, mouth and genitals (private parts). Wash genitals (private parts) with your normal soap.  6. Wash thoroughly, paying special attention to the area where your surgery will be performed.  7. Thoroughly rinse your body with warm water from the neck down.  8. DO NOT shower/wash with your normal soap after using and rinsing off the CHG Soap.  9. Pat yourself dry with a  CLEAN TOWEL.   10. Wear CLEAN PAJAMAS   11. Place CLEAN SHEETS on your bed the night of your first shower and DO NOT SLEEP WITH PETS.    Day of Surgery: Do not apply any deodorants/lotions. Please wear clean clothes to the hospital/surgery center.      Please read over the following fact sheets that you were given.

## 2017-06-17 NOTE — Sedation Documentation (Signed)
Patient is resting comfortably. 

## 2017-06-17 NOTE — Sedation Documentation (Signed)
Having back pain 9/10. Will get her moved to stretcher as soon as possible.

## 2017-06-17 NOTE — H&P (Signed)
Chief Complaint: Patient was seen in consultation today for retrievable inferior vena cava filter placement at the request of Dr Coralie Common Referring Physician(s): Dr Coralie Common  Supervising Physician: Arne Cleveland  Patient Status: [facility] - Out-pt  History of Present Illness: Jocelyn Curtis is a 71 y.o. 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 She is now scheduled for spinal fusion surgery 9/20 18 with Dr Ronnald Ramp He is requesting IVC filter placement as PE prophylaxis  She did see Dr Laurence Ferrari in consultation 06/12/17 71 year old female with chronic back pain set to undergo multilevel posterior spinal fusion on 06/20/2017. She has a history of prior DVT following knee replacement surgery and is at risk for recurrent DVT. We discussed a plan of placing a retrievable IVC filter prior to the surgery and then bringing her back to have the filter removed what she has recovered and resumed normal activity.  Now scheduled for retrievable filter placement   Past Medical History:  Diagnosis Date  . AAA (abdominal aortic aneurysm) ([place])   . Abdominal aortic aneurysm without rupture [facility])    followed by Dr. Concepcion Elk  . Anxiety and depression   . Arthritis    "all over body"  . Blood transfusion 48yrs ago  . Bruises easily   . Cancer ([place])    HX SKIN CA  . Carotid artery occlusion   . Cerebrovascular disease    pt states she pulls to the left and staggers alot  . CHF (congestive heart failure) ([place])    USED TO SEE DR GROVE  . Chronic back pain   . Complication of anesthesia    ENDED WITH BLOOD CLOTT IN LEG  . COPD (chronic obstructive pulmonary disease) ([place])   . Coronary artery disease   . Depression    takes Paxil daily  . Diabetes mellitus    but controlled with diet and exercise  . Dizziness   . Dizziness - giddy   . Dysrhythmia    IRREG HEARTBEATS  . Foot drop    right since knee replacement  . GERD (gastroesophageal reflux  disease)    takes Protonix daily  . H/O blood clots    in right foot-04/24/11  . H/O: GI bleed   . History of bladder infections   . Hyperlipidemia    takes Pravastatin daily  . Hypertension    takes  Lisinopril daily, Dr. Ricci Barker. took care of pt. before his retirement   . Osteoarthritis   . Osteoarthritis (arthritis due to wear and tear of joints)   . Peripheral vascular disease ([place])    blood clot in foot following epidural anesthesia - 04/2011  . Pneumonia    hx of;last time about 7yrs ago 2008  . Renal insufficiency    SEE MED DR Welton Flakes [place]  . Shortness of breath    with exertion  . Stroke [facility])    ministrokes many yrs. ago   . Subclavian steal syndrome   . Subclavian steal syndrome   . Vision loss    left eye    Past Surgical History:  Procedure Laterality Date  . APPENDECTOMY    . CARDIAC CATHETERIZATION    . CAROTID-SUBCLAVIAN BYPASS GRAFT  05/02/2012   Procedure: BYPASS GRAFT CAROTID-SUBCLAVIAN;  Surgeon: Mal Misty, MD;  Location: [place];  Service: Vascular;  Laterality: Left;  . CARPAL TUNNEL RELEASE    . COSMETIC SURGERY    . ENDARTERECTOMY  05/02/2012  Procedure: ENDARTERECTOMY SUBCLAVIAN;  Surgeon: Mal Misty, MD;  Location: [facility];  Service: Vascular;  Laterality: Left;  . FOOT SURGERY     left  . HAND SURGERY     right  . JOINT REPLACEMENT     bilateral total knee replacement  . NASAL SINUS SURGERY    . perineal abscess I & D     necrotizing soft tissue infection 2005  . ROTATOR CUFF REPAIR     bilateral  . VAGINAL HYSTERECTOMY      Allergies: Metformin and related; Nsaids; Codeine; Lisinopril; and Penicillins  Medications: Prior to Admission medications   Medication Sig Start Date End Date Taking? Authorizing Provider  aspirin 81 MG chewable tablet Chew 81 mg by mouth daily.   Yes [provider]  atorvastatin (LIPITOR) 80 MG tablet Take 80 mg by mouth every morning.    Yes [provider]  glimepiride  (AMARYL) 4 MG tablet Take 8 mg by mouth daily with breakfast. Take 2 tablets every morning.    Yes [provider]  oxyCODONE-acetaminophen (PERCOCET) 10-325 MG tablet Take 1 tablet by mouth every 4 (four) hours as needed for pain.   Yes [provider]  pantoprazole (PROTONIX) 40 MG tablet Take 40 mg by mouth daily.   Yes [provider]  PARoxetine (PAXIL) 30 MG tablet Take 30 mg by mouth every morning.    Yes [provider]  traZODone (DESYREL) 150 MG tablet Take 150 mg by mouth at bedtime.   Yes [provider]     Family History  Problem Relation Age of Onset  . Diabetes Mother   . Heart disease Mother        before age 6  . Hyperlipidemia Mother   . Hypertension Mother   . Heart attack Mother   . Cancer Father   . Heart disease Father        before age 67  . Hyperlipidemia Father   . Hypertension Father   . Heart attack Father   . Deep vein thrombosis Sister   . Diabetes Sister   . Heart disease Sister   . Hyperlipidemia Sister   . Hypertension Sister   . Heart attack Sister   . Diabetes Brother   . Hypertension Brother   . Diabetes Daughter   . Anesthesia problems Neg Hx   . Hypotension Neg Hx   . Malignant hyperthermia Neg Hx   . Pseudochol deficiency Neg Hx     Social History   Social History  . Marital status: Widowed    Spouse name: N/A  . Number of children: N/A  . Years of education: N/A   Social History Main Topics  . Smoking status: Current Some Day Smoker    Packs/day: 0.25    Years: 48.00    Types: Cigarettes  . Smokeless tobacco: Current User    Types: Snuff     Comment: uses snuff every once in a while  . Alcohol use No     Comment: occ beer  . Drug use: No  . Sexual activity: Yes    Birth control/ protection: Surgical   Other Topics Concern  . None   Social History Narrative  . None    Review of Systems: A 12 point ROS discussed and pertinent positives are indicated in the HPI above.   All other systems are negative.  Review of Systems  Constitutional: Negative for activity change, fatigue and fever.  Respiratory: Negative for cough and shortness  of breath.   Cardiovascular: Negative for chest pain and leg swelling.  Gastrointestinal: Negative for abdominal pain.  Psychiatric/Behavioral: Negative for behavioral problems and confusion.    Vital Signs: BP 114/69   Pulse 76   Temp 98 F (36.7 C) (Oral)   Resp 18   Ht 5\' 8"  (1.727 m)   Wt 199 lb (90.3 kg)   SpO2 96%   BMI 30.26 kg/m   Physical Exam  Constitutional: She is oriented to person, place, and time.  Cardiovascular: Normal rate, regular rhythm and normal heart sounds.   Pulmonary/Chest: Effort normal and breath sounds normal.  Abdominal: Soft. Bowel sounds are normal.  Musculoskeletal: Normal range of motion.  Neurological: She is alert and oriented to person, place, and time.  Skin: Skin is warm and dry.  Psychiatric: She has a normal mood and affect. Her behavior is normal. Judgment and thought content normal.  Nursing note and vitals reviewed.   Imaging: No results found.  Labs:  CBC:  Recent Labs  06/17/17 0703  WBC 9.7  HGB 14.2  HCT 43.5  PLT 189    COAGS:  Recent Labs  06/17/17 0703  INR 0.89  APTT 32    BMP:  Recent Labs  06/17/17 0703  NA 138  K 4.0  CL 102  CO2 29  GLUCOSE 155*  BUN 25*  CALCIUM 9.2  CREATININE 1.65*  GFRNONAA 30*  GFRAA 35*    LIVER FUNCTION TESTS: No results for input(s): BILITOT, AST, ALT, ALKPHOS, PROT, ALBUMIN in the last 8760 hours.  TUMOR MARKERS: No results for input(s): AFPTM, CEA, CA199, CHROMGRNA in the last 8760 hours.  Assessment and Plan:  Hx DVT after knee replacement surgery Now scheduled for spine surgery that may entail lengthy recovery Scheduled for retrievable inferior vena cava filter placement Risks and benefits discussed with the patient including, but not limited to bleeding, infection, contrast induced  renal failure, filter fracture or migration which can lead to emergency surgery or even death, strut penetration with damage or irritation to adjacent structures and caval thrombosis. All of the patient's questions were answered, patient is agreeable to proceed. Consent signed and in chart.   Thank you for this interesting consult.  I greatly enjoyed meeting Jocelyn Curtis and look forward to participating in their care.  A copy of this report was sent to the requesting provider on this date.  Electronically Signed: Lavonia Drafts, PA-C 06/17/2017, 8:48 AM   I spent a total of  30 Minutes   in face to face in clinical consultation, greater than 50% of which was counseling/coordinating care for IVC filter placement

## 2017-06-18 ENCOUNTER — Encounter (HOSPITAL_COMMUNITY): Payer: Self-pay

## 2017-06-18 ENCOUNTER — Encounter (HOSPITAL_COMMUNITY)
Admission: RE | Admit: 2017-06-18 | Discharge: 2017-06-18 | Disposition: A | Discharge status: Home / Self Care | Payer: Medicare HMO | Source: Ambulatory Visit | Attending: Neurological Surgery | Admitting: Neurological Surgery

## 2017-06-18 ENCOUNTER — Ambulatory Visit (HOSPITAL_COMMUNITY)
Admission: RE | Admit: 2017-06-18 | Discharge: 2017-06-18 | Disposition: A | Discharge status: Home / Self Care | Payer: Medicare HMO | Source: Ambulatory Visit | Attending: Neurological Surgery | Admitting: Neurological Surgery

## 2017-06-18 DIAGNOSIS — Z01818 Encounter for other preprocedural examination: Secondary | ICD-10-CM | POA: Diagnosis not present

## 2017-06-18 DIAGNOSIS — M48061 Spinal stenosis, lumbar region without neurogenic claudication: Secondary | ICD-10-CM | POA: Diagnosis not present

## 2017-06-18 DIAGNOSIS — E669 Obesity, unspecified: Secondary | ICD-10-CM | POA: Diagnosis not present

## 2017-06-18 DIAGNOSIS — Z01810 Encounter for preprocedural cardiovascular examination: Secondary | ICD-10-CM | POA: Diagnosis not present

## 2017-06-18 DIAGNOSIS — E119 Type 2 diabetes mellitus without complications: Secondary | ICD-10-CM | POA: Diagnosis not present

## 2017-06-18 DIAGNOSIS — Z01812 Encounter for preprocedural laboratory examination: Secondary | ICD-10-CM | POA: Diagnosis not present

## 2017-06-18 DIAGNOSIS — Z6831 Body mass index (BMI) 31.0-31.9, adult: Secondary | ICD-10-CM | POA: Diagnosis not present

## 2017-06-18 DIAGNOSIS — Z7984 Long term (current) use of oral hypoglycemic drugs: Secondary | ICD-10-CM | POA: Diagnosis not present

## 2017-06-18 DIAGNOSIS — M48062 Spinal stenosis, lumbar region with neurogenic claudication: Secondary | ICD-10-CM | POA: Diagnosis not present

## 2017-06-18 DIAGNOSIS — Z79891 Long term (current) use of opiate analgesic: Secondary | ICD-10-CM | POA: Diagnosis not present

## 2017-06-18 LAB — CBC WITH DIFFERENTIAL/PLATELET
Basophils Absolute: 0 10*3/uL (ref 0.0–0.1)
Basophils Relative: 0 %
Eosinophils Absolute: 0.2 10*3/uL (ref 0.0–0.7)
Eosinophils Relative: 2 %
HEMATOCRIT: 45.8 % (ref 36.0–46.0)
Hemoglobin: 14.8 g/dL (ref 12.0–15.0)
LYMPHS ABS: 3.3 10*3/uL (ref 0.7–4.0)
LYMPHS PCT: 35 %
MCH: 31.9 pg (ref 26.0–34.0)
MCHC: 32.3 g/dL (ref 30.0–36.0)
MCV: 98.7 fL (ref 78.0–100.0)
MONO ABS: 0.6 10*3/uL (ref 0.1–1.0)
MONOS PCT: 6 %
NEUTROS ABS: 5.5 10*3/uL (ref 1.7–7.7)
Neutrophils Relative %: 57 %
Platelets: 182 10*3/uL (ref 150–400)
RBC: 4.64 MIL/uL (ref 3.87–5.11)
RDW: 13.9 % (ref 11.5–15.5)
WBC: 9.6 10*3/uL (ref 4.0–10.5)

## 2017-06-18 LAB — PROTIME-INR
INR: 0.91
Prothrombin Time: 12.1 seconds (ref 11.4–15.2)

## 2017-06-18 LAB — TYPE AND SCREEN
ABO/RH(D): O POS
Antibody Screen: NEGATIVE

## 2017-06-18 LAB — SURGICAL PCR SCREEN
MRSA, PCR: NEGATIVE
Staphylococcus aureus: NEGATIVE

## 2017-06-18 LAB — BASIC METABOLIC PANEL
ANION GAP: 9 (ref 5–15)
BUN: 22 mg/dL — ABNORMAL HIGH (ref 6–20)
CALCIUM: 9.5 mg/dL (ref 8.9–10.3)
CHLORIDE: 102 mmol/L (ref 101–111)
CO2: 26 mmol/L (ref 22–32)
Creatinine, Ser: 1.38 mg/dL — ABNORMAL HIGH (ref 0.44–1.00)
GFR calc Af Amer: 43 mL/min — ABNORMAL LOW (ref >60)
GFR calc non Af Amer: 37 mL/min — ABNORMAL LOW (ref >60)
GLUCOSE: 131 mg/dL — AB (ref 65–99)
POTASSIUM: 4.5 mmol/L (ref 3.5–5.1)
Sodium: 137 mmol/L (ref 135–145)

## 2017-06-18 LAB — HEMOGLOBIN A1C
HEMOGLOBIN A1C: 6.9 % — AB (ref 4.8–5.6)
MEAN PLASMA GLUCOSE: 151.33 mg/dL

## 2017-06-18 LAB — GLUCOSE, CAPILLARY: Glucose-Capillary: 144 mg/dL — ABNORMAL HIGH (ref 65–99)

## 2017-06-18 NOTE — Pre-Procedure Instructions (Signed)
Jocelyn Curtis  06/18/2017      [id number] [place] [place] [address] [place] [address] Phone: [phone] Fax: [phone]  [id number] [place], [address] [place] [address] Phone: [phone] Fax: [phone]  [id number] - [place], [place] [address] Phone: [phone] Fax: [phone]    Your procedure is scheduled on June 20, 2017.  Report to [facility] Admitting at 9:30 A.M.  Call this number if you have problems the morning of surgery:  [phone]  Call [phone] if you have any questions prior to your surgery date Monday-Friday 8am-4pm   Remember:  Do not eat food or drink liquids after midnight.  Take these medicines the morning of surgery with A SIP OF WATER Pantoprazole (Protonix), Paroxetine (Paxil)  7 days prior to surgery STOP (06/18/17) taking any aspirin,Aleve, Naproxen, Ibuprofen, Motrin, Advil, Goody's, BC's, all herbal medications, fish oil, and all vitamins.   How to Manage Your Diabetes Before and After Surgery  Why is it important to control my blood sugar before and after surgery? . Improving blood sugar levels before and after surgery helps healing and can limit problems. . A way of improving blood sugar control is eating a healthy diet by: o  Eating less sugar and carbohydrates o  Increasing activity/exercise o  Talking with your doctor about reaching your blood sugar goals . High blood sugars (greater than 180 mg/dL) can raise your risk of infections and slow your recovery, so you will need to focus on controlling your diabetes during the weeks before surgery. . Make sure that the doctor who takes care of your diabetes knows about your planned surgery including the date and location.  How do I manage my blood sugar before surgery? . Check your blood sugar at  least 4 times a day, starting 2 days before surgery, to make sure that the level is not too high or low. o Check your blood sugar the morning of your surgery when you wake up and every 2 hours until you get to the Short Stay unit. . If your blood sugar is less than 70 mg/dL, you will need to treat for low blood sugar: o Do not take insulin. o Treat a low blood sugar (less than 70 mg/dL) with  cup of clear juice (cranberry or apple), 4 glucose tablets, OR glucose gel. o Recheck blood sugar in 15 minutes after treatment (to make sure it is greater than 70 mg/dL). If your blood sugar is not greater than 70 mg/dL on recheck, call [phone] for further instructions. . Report your blood sugar to the short stay nurse when you get to Short Stay.  . If you are admitted to the hospital after surgery: o Your blood sugar will be checked by the staff and you will probably be given insulin after surgery (instead of oral diabetes medicines) to make sure you have good blood sugar levels. o The goal for blood sugar control after surgery is 80-180 mg/dL.  WHAT DO I DO ABOUT MY DIABETES MEDICATION?  Do not take oral diabetes medicines (pills) the morning of surgery.(glimepiride)    Do not wear jewelry, make-up or nail polish.  Do not wear lotions, powders, or perfumes, or deodorant.  Do not shave 48 hours prior to surgery.    Do not bring valuables to the hospital.  [facility] is not responsible for any belongings or valuables.  Contacts, dentures or bridgework may not be worn into surgery.  Leave your suitcase in the car.  After surgery it may be brought to your room.  For patients admitted to the hospital, discharge time will be determined by your treatment team.  Patients discharged the day of surgery will not be allowed to drive home.   Special instructions:   [facility]- Preparing For Surgery  Before surgery, you can play an important role. Because skin is not sterile, your skin needs to be  as free of germs as possible. You can reduce the number of germs on your skin by washing with CHG (chlorahexidine gluconate) Soap before surgery.  CHG is an antiseptic cleaner which kills germs and bonds with the skin to continue killing germs even after washing.  Please do not use if you have an allergy to CHG or antibacterial soaps. If your skin becomes reddened/irritated stop using the CHG.  Do not shave (including legs and underarms) for at least 48 hours prior to first CHG shower. It is OK to shave your face.  Please follow these instructions carefully.   1. Shower the NIGHT BEFORE SURGERY and the MORNING OF SURGERY with CHG.   2. If you chose to wash your hair, wash your hair first as usual with your normal shampoo.  3. After you shampoo, rinse your hair and body thoroughly to remove the shampoo.  4. Use CHG as you would any other liquid soap. You can apply CHG directly to the skin and wash gently with a scrungie or a clean washcloth.   5. Apply the CHG Soap to your body ONLY FROM THE NECK DOWN.  Do not use on open wounds or open sores. Avoid contact with your eyes, ears, mouth and genitals (private parts). Wash genitals (private parts) with your normal soap.  6. Wash thoroughly, paying special attention to the area where your surgery will be performed.  7. Thoroughly rinse your body with warm water from the neck down.  8. DO NOT shower/wash with your normal soap after using and rinsing off the CHG Soap.  9. Pat yourself dry with a CLEAN TOWEL.   10. Wear CLEAN PAJAMAS   11. Place CLEAN SHEETS on your bed the night of your first shower and DO NOT SLEEP WITH PETS.    Day of Surgery: Do not apply any deodorants/lotions. Please wear clean clothes to the hospital/surgery center.      Please read over the fact sheets that you were given.

## 2017-06-18 NOTE — Pre-Procedure Instructions (Signed)
Jocelyn Curtis  06/18/2017      [id number] - [place], [place] [address] [place] [address] Phone: [phone] Fax: [phone]  [id number] - [place], [place] [place] [address] Phone: [phone] Fax: [phone]  [id number] - [place], [place] [place] [address] Phone: [phone] Fax: [phone]    Your procedure is scheduled on June 20, 2017.  Report to [facility] Admitting at 9:30 A.M.  Call this number if you have problems the morning of surgery:  [phone]  Call [phone] if you have any questions prior to your surgery date Monday-Friday 8am-4pm   Remember:  Do not eat food or drink liquids after midnight.  Take these medicines the morning of surgery with A SIP OF WATER Pantoprazole (Protonix), Paroxetine (Paxil)  7 days prior to surgery STOP (06/18/17) taking any Aleve, Naproxen, Ibuprofen, Motrin, Advil, Goody's, BC's, all herbal medications, fish oil, and all vitamins.   How to Manage Your Diabetes Before and After Surgery  Why is it important to control my blood sugar before and after surgery? . Improving blood sugar levels before and after surgery helps healing and can limit problems. . A way of improving blood sugar control is eating a healthy diet by: o  Eating less sugar and carbohydrates o  Increasing activity/exercise o  Talking with your doctor about reaching your blood sugar goals . High blood sugars (greater than 180 mg/dL) can raise your risk of infections and slow your recovery, so you will need to focus on controlling your diabetes during the weeks before surgery. . Make sure that the doctor who takes care of your diabetes knows about your planned surgery including the date and location.  How do I manage my blood sugar before surgery? . Check your blood sugar at least 4  times a day, starting 2 days before surgery, to make sure that the level is not too high or low. o Check your blood sugar the morning of your surgery when you wake up and every 2 hours until you get to the Short Stay unit. . If your blood sugar is less than 70 mg/dL, you will need to treat for low blood sugar: o Do not take insulin. o Treat a low blood sugar (less than 70 mg/dL) with  cup of clear juice (cranberry or apple), 4 glucose tablets, OR glucose gel. o Recheck blood sugar in 15 minutes after treatment (to make sure it is greater than 70 mg/dL). If your blood sugar is not greater than 70 mg/dL on recheck, call [phone] for further instructions. . Report your blood sugar to the short stay nurse when you get to Short Stay.  . If you are admitted to the hospital after surgery: o Your blood sugar will be checked by the staff and you will probably be given insulin after surgery (instead of oral diabetes medicines) to make sure you have good blood sugar levels. o The goal for blood sugar control after surgery is 80-180 mg/dL.  WHAT DO I DO ABOUT MY DIABETES MEDICATION?  Do not take oral diabetes medicines (pills) the morning of surgery.(glimepiride)    Do not wear jewelry, make-up or nail polish.  Do not wear lotions, powders, or perfumes, or deodorant.  Do not shave 48 hours prior to surgery.    Do not bring valuables to the hospital.  [facility] is not responsible for any belongings or valuables.  Contacts, dentures or bridgework may not be worn into surgery.  Leave your suitcase in the car.  After surgery it may be brought to your room.  For patients admitted to the hospital, discharge time will be determined by your treatment team.  Patients discharged the day of surgery will not be allowed to drive home.   Special instructions:   [facility]- Preparing For Surgery  Before surgery, you can play an important role. Because skin is not sterile, your skin needs to be as free  of germs as possible. You can reduce the number of germs on your skin by washing with CHG (chlorahexidine gluconate) Soap before surgery.  CHG is an antiseptic cleaner which kills germs and bonds with the skin to continue killing germs even after washing.  Please do not use if you have an allergy to CHG or antibacterial soaps. If your skin becomes reddened/irritated stop using the CHG.  Do not shave (including legs and underarms) for at least 48 hours prior to first CHG shower. It is OK to shave your face.  Please follow these instructions carefully.   1. Shower the NIGHT BEFORE SURGERY and the MORNING OF SURGERY with CHG.   2. If you chose to wash your hair, wash your hair first as usual with your normal shampoo.  3. After you shampoo, rinse your hair and body thoroughly to remove the shampoo.  4. Use CHG as you would any other liquid soap. You can apply CHG directly to the skin and wash gently with a scrungie or a clean washcloth.   5. Apply the CHG Soap to your body ONLY FROM THE NECK DOWN.  Do not use on open wounds or open sores. Avoid contact with your eyes, ears, mouth and genitals (private parts). Wash genitals (private parts) with your normal soap.  6. Wash thoroughly, paying special attention to the area where your surgery will be performed.  7. Thoroughly rinse your body with warm water from the neck down.  8. DO NOT shower/wash with your normal soap after using and rinsing off the CHG Soap.  9. Pat yourself dry with a CLEAN TOWEL.   10. Wear CLEAN PAJAMAS   11. Place CLEAN SHEETS on your bed the night of your first shower and DO NOT SLEEP WITH PETS.    Day of Surgery: Do not apply any deodorants/lotions. Please wear clean clothes to the hospital/surgery center.      Please read over the fact sheets that you were given.

## 2017-06-19 MED ORDER — DEXAMETHASONE SODIUM PHOSPHATE 10 MG/ML IJ SOLN
10.0000 mg | INTRAMUSCULAR | Status: AC
  Administered 2017-06-20: 10 mg via INTRAVENOUS
  Filled 2017-06-19: qty 1

## 2017-06-19 MED ORDER — VANCOMYCIN HCL IN DEXTROSE 1-5 GM/200ML-% IV SOLN
1000.0000 mg | INTRAVENOUS | Status: AC
  Administered 2017-06-20: 1000 mg via INTRAVENOUS
  Filled 2017-06-19: qty 200

## 2017-06-19 NOTE — Progress Notes (Signed)
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 Current smoker. BMI 30.5  CAD, CHF, atrial fibrillation also listed in [place].  By notes 12/31/11 and 04/29/12 by Myra Gianotti, PA "She reported a vague history of CAD but denied history of cath, MI, CABG, or stent.  She had previously been followed by Dr. Gwinda Passe, now retired" (last visit with him approximately 8 years prior to 2013) and records not available.  "Upon further questioning it sounds like she is including SCA stenosis as CAD". Other history from Ms. Felicita Gage notes include "irregular heartbeats without mention of afib"  Preoperative labs reviewed.  HbA1c 6.9, glucose 131  CXR 06/18/17: No active cardiopulmonary disease.  EKG 06/18/17: NSR  Carotid duplex 10/26/15:  1. <40% B ICA stenosis  2. Widely patent L carotid/subclavian bypass  CT abdomen/pelvis 10/21/13:  1. No acute findings within the abdomen or pelvis. No mass or adenopathy noted. 2. Atherosclerotic disease. The abdominal aorta measures up to 3.4 cm. Recommend follow up by [place] in 3years.  3. Patchy nodular densities in the lung bases are favored to be the sequelae of inflammation/infection.  Echo 09/15/12:  - Left ventricle: The cavity size was normal. Wall thickness was normal. Systolic function was normal. The estimated ejection fraction was in the range of 55% to 60%. Wall motion was normal; there were no regional wall motion abnormalities. Doppler parameters are consistent with abnormal left ventricular relaxation (grade 1 diastolic dysfunction).      Nuclear stress test 04/23/12:  - Normal stress nuclear  study. - LV Ejection Fraction: 68%.  LV Wall Motion:  NL LV Function; NL Wall Motion  Reviewed case with Dr. Linna Caprice.   If no changes, I anticipate pt can proceed with surgery as scheduled.   Willeen Cass, FNP-BC [facility] Short Stay Surgical Center/Anesthesiology Phone: [phone] 06/19/2017 2:28 PM

## 2017-06-20 ENCOUNTER — Ambulatory Visit (HOSPITAL_COMMUNITY): Payer: Medicare HMO | Admitting: Emergency Medicine

## 2017-06-20 ENCOUNTER — Encounter (HOSPITAL_COMMUNITY): Payer: Self-pay | Admitting: *Deleted

## 2017-06-20 ENCOUNTER — Observation Stay (HOSPITAL_COMMUNITY)
Admission: RE | Admit: 2017-06-20 | Discharge: 2017-06-24 | Disposition: A | Discharge status: Skilled Nursing Facility | Payer: Medicare HMO | Source: Ambulatory Visit | Attending: Neurological Surgery | Admitting: Neurological Surgery

## 2017-06-20 ENCOUNTER — Encounter (HOSPITAL_COMMUNITY)
Admission: RE | Disposition: A | Discharge status: Skilled Nursing Facility | Payer: Self-pay | Source: Ambulatory Visit | Attending: Neurological Surgery

## 2017-06-20 ENCOUNTER — Ambulatory Visit (HOSPITAL_COMMUNITY): Payer: Medicare HMO

## 2017-06-20 DIAGNOSIS — F17210 Nicotine dependence, cigarettes, uncomplicated: Secondary | ICD-10-CM | POA: Diagnosis not present

## 2017-06-20 DIAGNOSIS — Z9889 Other specified postprocedural states: Secondary | ICD-10-CM

## 2017-06-20 DIAGNOSIS — R69 Illness, unspecified: Secondary | ICD-10-CM | POA: Diagnosis not present

## 2017-06-20 DIAGNOSIS — Z888 Allergy status to other drugs, medicaments and biological substances status: Secondary | ICD-10-CM | POA: Insufficient documentation

## 2017-06-20 DIAGNOSIS — Z79899 Other long term (current) drug therapy: Secondary | ICD-10-CM | POA: Insufficient documentation

## 2017-06-20 DIAGNOSIS — E119 Type 2 diabetes mellitus without complications: Secondary | ICD-10-CM | POA: Insufficient documentation

## 2017-06-20 DIAGNOSIS — I2510 Atherosclerotic heart disease of native coronary artery without angina pectoris: Secondary | ICD-10-CM | POA: Insufficient documentation

## 2017-06-20 DIAGNOSIS — I10 Essential (primary) hypertension: Secondary | ICD-10-CM | POA: Diagnosis not present

## 2017-06-20 DIAGNOSIS — M1990 Unspecified osteoarthritis, unspecified site: Secondary | ICD-10-CM | POA: Insufficient documentation

## 2017-06-20 DIAGNOSIS — Z880 Allergy status to penicillin: Secondary | ICD-10-CM | POA: Diagnosis not present

## 2017-06-20 DIAGNOSIS — I509 Heart failure, unspecified: Secondary | ICD-10-CM | POA: Diagnosis not present

## 2017-06-20 DIAGNOSIS — E785 Hyperlipidemia, unspecified: Secondary | ICD-10-CM | POA: Insufficient documentation

## 2017-06-20 DIAGNOSIS — M5116 Intervertebral disc disorders with radiculopathy, lumbar region: Secondary | ICD-10-CM | POA: Insufficient documentation

## 2017-06-20 DIAGNOSIS — M419 Scoliosis, unspecified: Secondary | ICD-10-CM | POA: Insufficient documentation

## 2017-06-20 DIAGNOSIS — K219 Gastro-esophageal reflux disease without esophagitis: Secondary | ICD-10-CM | POA: Diagnosis not present

## 2017-06-20 DIAGNOSIS — J449 Chronic obstructive pulmonary disease, unspecified: Secondary | ICD-10-CM | POA: Diagnosis not present

## 2017-06-20 DIAGNOSIS — Z79891 Long term (current) use of opiate analgesic: Secondary | ICD-10-CM | POA: Diagnosis not present

## 2017-06-20 DIAGNOSIS — F418 Other specified anxiety disorders: Secondary | ICD-10-CM | POA: Diagnosis not present

## 2017-06-20 DIAGNOSIS — M5136 Other intervertebral disc degeneration, lumbar region: Secondary | ICD-10-CM | POA: Diagnosis not present

## 2017-06-20 DIAGNOSIS — M48061 Spinal stenosis, lumbar region without neurogenic claudication: Secondary | ICD-10-CM | POA: Insufficient documentation

## 2017-06-20 DIAGNOSIS — Z7982 Long term (current) use of aspirin: Secondary | ICD-10-CM | POA: Diagnosis not present

## 2017-06-20 DIAGNOSIS — M4804 Spinal stenosis, thoracic region: Principal | ICD-10-CM | POA: Insufficient documentation

## 2017-06-20 DIAGNOSIS — Z419 Encounter for procedure for purposes other than remedying health state, unspecified: Secondary | ICD-10-CM

## 2017-06-20 DIAGNOSIS — M4805 Spinal stenosis, thoracolumbar region: Secondary | ICD-10-CM | POA: Diagnosis not present

## 2017-06-20 DIAGNOSIS — I110 Hypertensive heart disease with heart failure: Secondary | ICD-10-CM | POA: Insufficient documentation

## 2017-06-20 HISTORY — PX: LUMBAR LAMINECTOMY/DECOMPRESSION MICRODISCECTOMY: SHX5026

## 2017-06-20 LAB — GLUCOSE, CAPILLARY
GLUCOSE-CAPILLARY: 158 mg/dL — AB (ref 65–99)
Glucose-Capillary: 161 mg/dL — ABNORMAL HIGH (ref 65–99)

## 2017-06-20 SURGERY — LUMBAR LAMINECTOMY/DECOMPRESSION MICRODISCECTOMY 2 LEVELS
Anesthesia: General | Site: Back

## 2017-06-20 MED ORDER — ONDANSETRON HCL 4 MG PO TABS
4.0000 mg | ORAL_TABLET | Freq: Four times a day (QID) | ORAL | Status: DC | PRN
Start: 2017-06-20 — End: 2017-06-24

## 2017-06-20 MED ORDER — MEPERIDINE HCL 25 MG/ML IJ SOLN: 6.2500 mg | INTRAMUSCULAR | Status: DC | PRN

## 2017-06-20 MED ORDER — VANCOMYCIN HCL 10 G IV SOLR
1500.0000 mg | INTRAVENOUS | Status: DC
  Filled 2017-06-20: qty 1500

## 2017-06-20 MED ORDER — METHOCARBAMOL 500 MG PO TABS
ORAL_TABLET | ORAL | Status: AC
  Administered 2017-06-21: 500 mg via ORAL
  Filled 2017-06-20: qty 1

## 2017-06-20 MED ORDER — ONDANSETRON HCL 4 MG/2ML IJ SOLN
INTRAMUSCULAR | Status: DC | PRN
  Administered 2017-06-20: 4 mg via INTRAVENOUS

## 2017-06-20 MED ORDER — METHOCARBAMOL 1000 MG/10ML IJ SOLN: 500.0000 mg | Freq: Four times a day (QID) | INTRAVENOUS | Status: DC | PRN

## 2017-06-20 MED ORDER — LACTATED RINGERS IV SOLN
INTRAVENOUS | Status: DC
  Administered 2017-06-20: 10:00:00 via INTRAVENOUS

## 2017-06-20 MED ORDER — ACETAMINOPHEN 325 MG PO TABS
650.0000 mg | ORAL_TABLET | ORAL | Status: DC | PRN
  Administered 2017-06-22 – 2017-06-24 (×4): 650 mg via ORAL
  Filled 2017-06-20 (×4): qty 2

## 2017-06-20 MED ORDER — ROCURONIUM BROMIDE 100 MG/10ML IV SOLN
INTRAVENOUS | Status: DC | PRN
  Administered 2017-06-20: 10 mg via INTRAVENOUS
  Administered 2017-06-20: 40 mg via INTRAVENOUS
  Administered 2017-06-20: 10 mg via INTRAVENOUS

## 2017-06-20 MED ORDER — EPHEDRINE SULFATE 50 MG/ML IJ SOLN
INTRAMUSCULAR | Status: DC | PRN
  Administered 2017-06-20 (×2): 5 mg via INTRAVENOUS

## 2017-06-20 MED ORDER — MORPHINE SULFATE (PF) 2 MG/ML IV SOLN
2.0000 mg | INTRAVENOUS | Status: DC | PRN
  Administered 2017-06-20 – 2017-06-22 (×4): 2 mg via INTRAVENOUS
  Filled 2017-06-20 (×3): qty 1

## 2017-06-20 MED ORDER — MENTHOL 3 MG MT LOZG: 1.0000 | LOZENGE | OROMUCOSAL | Status: DC | PRN

## 2017-06-20 MED ORDER — CHLORHEXIDINE GLUCONATE CLOTH 2 % EX PADS: 6.0000 | MEDICATED_PAD | Freq: Once | CUTANEOUS | Status: DC

## 2017-06-20 MED ORDER — SODIUM CHLORIDE 0.9 % IR SOLN
Status: DC | PRN
  Administered 2017-06-20: 12:00:00

## 2017-06-20 MED ORDER — TRAZODONE HCL 50 MG PO TABS
150.0000 mg | ORAL_TABLET | Freq: Every day | ORAL | Status: DC
  Administered 2017-06-20 – 2017-06-23 (×4): 150 mg via ORAL
  Filled 2017-06-20 (×4): qty 1

## 2017-06-20 MED ORDER — ACETAMINOPHEN 10 MG/ML IV SOLN: 1000.0000 mg | Freq: Once | INTRAVENOUS | Status: DC | PRN

## 2017-06-20 MED ORDER — LACTATED RINGERS IV SOLN
INTRAVENOUS | Status: DC | PRN
  Administered 2017-06-20 (×2): via INTRAVENOUS

## 2017-06-20 MED ORDER — ONDANSETRON HCL 4 MG/2ML IJ SOLN: 4.0000 mg | Freq: Four times a day (QID) | INTRAMUSCULAR | Status: DC | PRN

## 2017-06-20 MED ORDER — OXYCODONE-ACETAMINOPHEN 5-325 MG PO TABS
1.0000 | ORAL_TABLET | ORAL | Status: DC | PRN
  Administered 2017-06-20 – 2017-06-24 (×12): 1 via ORAL
  Filled 2017-06-20 (×13): qty 1

## 2017-06-20 MED ORDER — 0.9 % SODIUM CHLORIDE (POUR BTL) OPTIME
TOPICAL | Status: DC | PRN
  Administered 2017-06-20: 1000 mL

## 2017-06-20 MED ORDER — PHENOL 1.4 % MT LIQD: 1.0000 | OROMUCOSAL | Status: DC | PRN

## 2017-06-20 MED ORDER — FENTANYL CITRATE (PF) 100 MCG/2ML IJ SOLN
25.0000 ug | INTRAMUSCULAR | Status: AC | PRN
  Administered 2017-06-20 (×6): 25 ug via INTRAVENOUS

## 2017-06-20 MED ORDER — ASPIRIN 81 MG PO CHEW
81.0000 mg | CHEWABLE_TABLET | Freq: Every day | ORAL | Status: DC
  Administered 2017-06-21 – 2017-06-24 (×4): 81 mg via ORAL
  Filled 2017-06-20 (×4): qty 1

## 2017-06-20 MED ORDER — SODIUM CHLORIDE 0.9% FLUSH
3.0000 mL | Freq: Two times a day (BID) | INTRAVENOUS | Status: DC
  Administered 2017-06-21 – 2017-06-22 (×4): 3 mL via INTRAVENOUS

## 2017-06-20 MED ORDER — BUPIVACAINE HCL (PF) 0.5 % IJ SOLN
INTRAMUSCULAR | Status: AC
  Filled 2017-06-20: qty 30

## 2017-06-20 MED ORDER — LIDOCAINE HCL (CARDIAC) 20 MG/ML IV SOLN
INTRAVENOUS | Status: DC | PRN
  Administered 2017-06-20: 100 mg via INTRAVENOUS

## 2017-06-20 MED ORDER — SUGAMMADEX SODIUM 200 MG/2ML IV SOLN
INTRAVENOUS | Status: DC | PRN
  Administered 2017-06-20: 200 mg via INTRAVENOUS

## 2017-06-20 MED ORDER — SENNA 8.6 MG PO TABS
1.0000 | ORAL_TABLET | Freq: Two times a day (BID) | ORAL | Status: DC
  Administered 2017-06-20 – 2017-06-24 (×8): 8.6 mg via ORAL
  Filled 2017-06-20 (×8): qty 1

## 2017-06-20 MED ORDER — OXYCODONE HCL 5 MG PO TABS
5.0000 mg | ORAL_TABLET | ORAL | Status: DC | PRN
  Administered 2017-06-20 – 2017-06-24 (×11): 5 mg via ORAL
  Filled 2017-06-20 (×12): qty 1

## 2017-06-20 MED ORDER — PANTOPRAZOLE SODIUM 40 MG PO TBEC
40.0000 mg | DELAYED_RELEASE_TABLET | Freq: Every day | ORAL | Status: DC
  Administered 2017-06-21 – 2017-06-24 (×4): 40 mg via ORAL
  Filled 2017-06-20 (×4): qty 1

## 2017-06-20 MED ORDER — ACETAMINOPHEN 650 MG RE SUPP: 650.0000 mg | RECTAL | Status: DC | PRN

## 2017-06-20 MED ORDER — FENTANYL CITRATE (PF) 100 MCG/2ML IJ SOLN
INTRAMUSCULAR | Status: AC
  Filled 2017-06-20: qty 2

## 2017-06-20 MED ORDER — PAROXETINE HCL 20 MG PO TABS
30.0000 mg | ORAL_TABLET | Freq: Every day | ORAL | Status: DC
  Administered 2017-06-21 – 2017-06-24 (×4): 30 mg via ORAL
  Filled 2017-06-20 (×4): qty 2

## 2017-06-20 MED ORDER — OXYCODONE-ACETAMINOPHEN 10-325 MG PO TABS: 1.0000 | ORAL_TABLET | ORAL | Status: DC | PRN

## 2017-06-20 MED ORDER — PHENYLEPHRINE HCL 10 MG/ML IJ SOLN
INTRAMUSCULAR | Status: DC | PRN
  Administered 2017-06-20: 80 ug via INTRAVENOUS

## 2017-06-20 MED ORDER — SODIUM CHLORIDE 0.9 % IV SOLN: 250.0000 mL | INTRAVENOUS | Status: DC

## 2017-06-20 MED ORDER — PROPOFOL 10 MG/ML IV BOLUS
INTRAVENOUS | Status: DC | PRN
  Administered 2017-06-20: 140 mg via INTRAVENOUS
  Administered 2017-06-20 (×2): 20 mg via INTRAVENOUS

## 2017-06-20 MED ORDER — THROMBIN 5000 UNITS EX SOLR
CUTANEOUS | Status: DC | PRN
  Administered 2017-06-20 (×2): 5000 [IU] via TOPICAL

## 2017-06-20 MED ORDER — MORPHINE SULFATE (PF) 4 MG/ML IV SOLN
INTRAVENOUS | Status: AC
  Filled 2017-06-20: qty 1

## 2017-06-20 MED ORDER — THROMBIN 5000 UNITS EX SOLR
OROMUCOSAL | Status: DC | PRN
  Administered 2017-06-20: 12:00:00 via TOPICAL

## 2017-06-20 MED ORDER — PROMETHAZINE HCL 25 MG/ML IJ SOLN: 6.2500 mg | INTRAMUSCULAR | Status: DC | PRN

## 2017-06-20 MED ORDER — THROMBIN 5000 UNITS EX SOLR
CUTANEOUS | Status: AC
  Filled 2017-06-20: qty 15000

## 2017-06-20 MED ORDER — METHOCARBAMOL 500 MG PO TABS
500.0000 mg | ORAL_TABLET | Freq: Four times a day (QID) | ORAL | Status: DC | PRN
  Administered 2017-06-20 – 2017-06-24 (×12): 500 mg via ORAL
  Filled 2017-06-20 (×11): qty 1

## 2017-06-20 MED ORDER — GLIMEPIRIDE 4 MG PO TABS
8.0000 mg | ORAL_TABLET | Freq: Every day | ORAL | Status: DC
  Administered 2017-06-21 – 2017-06-24 (×4): 8 mg via ORAL
  Filled 2017-06-20 (×4): qty 2

## 2017-06-20 MED ORDER — ACETAMINOPHEN 10 MG/ML IV SOLN
INTRAVENOUS | Status: DC | PRN
  Administered 2017-06-20: 1000 mg via INTRAVENOUS

## 2017-06-20 MED ORDER — POTASSIUM CHLORIDE IN NACL 20-0.9 MEQ/L-% IV SOLN: INTRAVENOUS | Status: DC

## 2017-06-20 MED ORDER — BUPIVACAINE HCL (PF) 0.5 % IJ SOLN
INTRAMUSCULAR | Status: DC | PRN
  Administered 2017-06-20: 8 mL

## 2017-06-20 MED ORDER — FENTANYL CITRATE (PF) 100 MCG/2ML IJ SOLN
INTRAMUSCULAR | Status: DC | PRN
  Administered 2017-06-20 (×3): 50 ug via INTRAVENOUS
  Administered 2017-06-20: 100 ug via INTRAVENOUS

## 2017-06-20 MED ORDER — SODIUM CHLORIDE 0.9% FLUSH
3.0000 mL | INTRAVENOUS | Status: DC | PRN
  Administered 2017-06-24: 3 mL via INTRAVENOUS
  Filled 2017-06-20: qty 3

## 2017-06-20 MED ORDER — ACETAMINOPHEN 10 MG/ML IV SOLN
INTRAVENOUS | Status: AC
  Filled 2017-06-20: qty 100

## 2017-06-20 SURGICAL SUPPLY — 46 items
BAG DECANTER FOR FLEXI CONT (MISCELLANEOUS) ×2 IMPLANT
BENZOIN TINCTURE PRP APPL 2/3 (GAUZE/BANDAGES/DRESSINGS) ×4 IMPLANT
BUR MATCHSTICK NEURO 3.0 LAGG (BURR) ×2 IMPLANT
CANISTER SUCT 3000ML PPV (MISCELLANEOUS) ×2 IMPLANT
CARTRIDGE OIL MAESTRO DRILL (MISCELLANEOUS) ×1 IMPLANT
DERMABOND ADVANCED (GAUZE/BANDAGES/DRESSINGS) ×1
DERMABOND ADVANCED .7 DNX12 (GAUZE/BANDAGES/DRESSINGS) ×1 IMPLANT
DIFFUSER DRILL AIR PNEUMATIC (MISCELLANEOUS) ×2 IMPLANT
DRAPE LAPAROTOMY 100X72X124 (DRAPES) ×2 IMPLANT
DRAPE MICROSCOPE LEICA (MISCELLANEOUS) ×2 IMPLANT
DRAPE POUCH INSTRU U-SHP 10X18 (DRAPES) ×2 IMPLANT
DRAPE SURG 17X23 STRL (DRAPES) ×2 IMPLANT
DRSG OPSITE POSTOP 4X6 (GAUZE/BANDAGES/DRESSINGS) ×2 IMPLANT
DURAPREP 26ML APPLICATOR (WOUND CARE) ×2 IMPLANT
ELECT REM PT RETURN 9FT ADLT (ELECTROSURGICAL) ×2
ELECTRODE REM PT RTRN 9FT ADLT (ELECTROSURGICAL) ×1 IMPLANT
EVACUATOR 1/8 PVC DRAIN (DRAIN) ×2 IMPLANT
GAUZE SPONGE 4X4 16PLY XRAY LF (GAUZE/BANDAGES/DRESSINGS) IMPLANT
GLOVE BIO SURGEON STRL SZ7 (GLOVE) IMPLANT
GLOVE BIO SURGEON STRL SZ8 (GLOVE) ×2 IMPLANT
GLOVE BIOGEL PI IND STRL 7.0 (GLOVE) IMPLANT
GLOVE BIOGEL PI INDICATOR 7.0 (GLOVE)
GOWN STRL REUS W/ TWL LRG LVL3 (GOWN DISPOSABLE) IMPLANT
GOWN STRL REUS W/ TWL XL LVL3 (GOWN DISPOSABLE) ×1 IMPLANT
GOWN STRL REUS W/TWL 2XL LVL3 (GOWN DISPOSABLE) IMPLANT
GOWN STRL REUS W/TWL LRG LVL3 (GOWN DISPOSABLE)
GOWN STRL REUS W/TWL XL LVL3 (GOWN DISPOSABLE) ×1
HEMOSTAT POWDER KIT SURGIFOAM (HEMOSTASIS) IMPLANT
KIT BASIN OR (CUSTOM PROCEDURE TRAY) ×2 IMPLANT
KIT ROOM TURNOVER OR (KITS) ×2 IMPLANT
NEEDLE HYPO 25X1 1.5 SAFETY (NEEDLE) ×2 IMPLANT
NEEDLE SPNL 20GX3.5 QUINCKE YW (NEEDLE) IMPLANT
NS IRRIG 1000ML POUR BTL (IV SOLUTION) ×2 IMPLANT
OIL CARTRIDGE MAESTRO DRILL (MISCELLANEOUS) ×2
PACK LAMINECTOMY NEURO (CUSTOM PROCEDURE TRAY) ×2 IMPLANT
PAD ARMBOARD 7.5X6 YLW CONV (MISCELLANEOUS) ×6 IMPLANT
RUBBERBAND STERILE (MISCELLANEOUS) ×4 IMPLANT
SPONGE SURGIFOAM ABS GEL SZ50 (HEMOSTASIS) ×2 IMPLANT
STRIP CLOSURE SKIN 1/2X4 (GAUZE/BANDAGES/DRESSINGS) ×4 IMPLANT
SUT VIC AB 0 CT1 18XCR BRD8 (SUTURE) ×2 IMPLANT
SUT VIC AB 0 CT1 8-18 (SUTURE) ×2
SUT VIC AB 2-0 CP2 18 (SUTURE) ×2 IMPLANT
SUT VIC AB 3-0 SH 8-18 (SUTURE) ×2 IMPLANT
TOWEL GREEN STERILE (TOWEL DISPOSABLE) ×2 IMPLANT
TOWEL GREEN STERILE FF (TOWEL DISPOSABLE) ×2 IMPLANT
WATER STERILE IRR 1000ML POUR (IV SOLUTION) ×2 IMPLANT

## 2017-06-20 NOTE — Anesthesia Preprocedure Evaluation (Addendum)
Anesthesia Evaluation  Patient identified by MRN, date of birth, ID band Patient awake    Reviewed: Allergy & Precautions, NPO status , Patient's Chart, lab work & pertinent test results  Airway Mallampati: I       Dental  (+) Edentulous Upper, Edentulous Lower   Pulmonary Current Smoker,    Pulmonary exam normal breath sounds clear to auscultation       Cardiovascular hypertension, Normal cardiovascular exam Rhythm:Regular Rate:Normal  Less than 40% B Carotid artery stenosis with patent L bypass graft in 10/2015   Neuro/Psych PSYCHIATRIC DISORDERS Depression    GI/Hepatic GERD  Medicated,  Endo/Other  diabetes, Type 2, Oral Hypoglycemic Agents  Renal/GU Renal InsufficiencyRenal disease  negative genitourinary   Musculoskeletal   Abdominal Normal abdominal exam  (+)   Peds  Hematology negative hematology ROS (+)   Anesthesia Other Findings BAELYNN SCHMUHL  2D Echocardiogram without contrast  Order# [id number]  Ordering physician: Toy Baker, MD Study date: 09/15/2012 Study Result   Result status: Final result *[facility]*           [address], [place] [address]            [phone]  ------------------------------------------------------------ Transthoracic Echocardiography  Patient:  Jocelyn, Curtis MR #:    [id number] Study Date: 09/15/2012 Gender:   F Age:    71 Height:   167.6cm Weight:   94.6kg BSA:    2.18m^2 Pt. Status: Room:    2609  [facility] SONOGRAPHER Diamond Nickel ATTENDING  Viyuoh, Adeline Alden Server, Anastassia cc:  ------------------------------------------------------------ LV EF: 55% -  60%  ------------------------------------------------------------ Indications:  Previous study 11/22/2011.   TIA 435.9.  ------------------------------------------------------------ History:  PMH: Weakness. Polypharmacy. Acute encephalopathy. History of subclavian steal syndrome. Altered mental status.  ------------------------------------------------------------ Study Conclusions  Left ventricle: The cavity size was normal. Wall thickness was normal. Systolic function was normal. The estimated ejection fraction was in the range of 55% to 60%. Wall motion was normal; there were no regional wall motion abnormalities. Doppler parameters are consistent with abnormal left ventricular relaxation (grade 1 diastolic dysfunction).       Transthoracic echocardiography. M-mode, complete 2D, spectral Doppler, and color Doppler. Height: Height: 167.6cm. Height: 66in. Weight: Weight: 94.6kg. Weight: 208.1lb. Body mass index: BMI: 33.7kg/m^2. Body surface area:  BSA: 2.71m^2. Blood pressure: 104/60. Patient status: Inpatient. Location: ICU/CCU  ------------------------------------------------------------  ------------------------------------------------------------ Left ventricle: The cavity size was normal. Wall thickness was normal. Systolic function was normal. The estimated ejection fraction was in the range of 55% to 60%. Wall motion was normal; there were no regional wall motion abnormalities. Doppler parameters are consistent with abnormal left ventricular relaxation (grade 1 diastolic dysfunction).  ------------------------------------------------------------ Aortic valve:  Structurally normal valve.  Cusp separation was normal. Doppler: Transvalvular velocity was within the normal range. There was no stenosis. No regurgitation.  ------------------------------------------------------------ Aorta: Aortic root: The aortic root was normal in size.  ------------------------------------------------------------ Mitral valve:  Structurally normal valve.   Leaflet separation was normal. Doppler: Transvalvular velocity was within the normal range. There was no evidence for stenosis. No regurgitation.  Peak gradient: 52mm Hg (D).  ------------------------------------------------------------ Left atrium: The atrium was normal in size.  ------------------------------------------------------------ Right ventricle: The cavity size was normal. Systolic function was normal.  ------------------------------------------------------------ Pulmonic valve:  Doppler: Transvalvular velocity was within the normal range. No regurgitation.  ------------------------------------------------------------  Tricuspid valve:  Structurally normal valve.  Leaflet separation was normal. Doppler: Transvalvular velocity was within the normal range. No regurgitation.  ------------------------------------------------------------ Right atrium: The atrium was normal in size.  ------------------------------------------------------------ Pericardium: There was no pericardial effusion.  ------------------------------------------------------------  2D measurements    Normal Doppler measurements  Normal Left ventricle         Left ventricle LVID ED,  40.1 mm   43-52  Ea, lat ann,  11 cm/s ------ chord,             tiss DP PLAX              E/Ea, lat   6.6   ------ LVID ES,   29 mm   23-38  ann, tiss DP chord,             Ea, med ann, 7.3 cm/s ------ PLAX              tiss DP     1 FS, chord,  28 %   >29   E/Ea, med   9.9   ------ PLAX              ann, tiss DP  3 LVPW, ED  11.7 mm   ------ Mitral valve IVS/LVPW  0.82    <1.3  Peak E vel  72. cm/s ------ ratio, ED                   6 Ventricular septum       Peak A vel  73. cm/s ------ IVS, ED  9.65 mm   ------         2 Aorta              Deceleration 263 ms  150-23 Root diam,  35 mm   ------ time          0 ED               Peak      2 mm  ------ Left atrium          gradient, D    Hg AP dim    34 mm   ------ Peak E/A    1   ------ AP dim   1.59 cm/m^2 <2.2  ratio index             Right ventricle                Sa vel, lat  11. cm/s ------                ann, tiss DP  4  ------------------------------------------------------------ Prepared and Electronically Authenticated by  Kirk Ruths 2013-12-16T11:39:22.450 Patient Information   Patient Name Jocelyn Curtis, Jocelyn Curtis Sex Female DOB 05/02/1953 SSN [id number] Reason For Exam  Priority: Routine  Not on file Surgical History   Surgical History    Procedure Laterality Date Comment Source CARDIAC CATHETERIZATION    Provider  Other Surgical History    Procedure Laterality Date Comment Source APPENDECTOMY    Provider CAROTID-SUBCLAVIAN BYPASS GRAFT  05/02/2012 Procedure: BYPASS GRAFT CAROTID-SUBCLAVIAN; Surgeon: Mal Misty, MD; Location: [facility]; Service: Vascular; Laterality: Left; Provider CARPAL TUNNEL RELEASE Left   Provider COSMETIC SURGERY   face lift Provider ENDARTERECTOMY  05/02/2012 Procedure: ENDARTERECTOMY SUBCLAVIAN; Surgeon: Mal Misty, MD; Location: [facility] OR; Service: Vascular; Laterality: Left; Provider EYE SURGERY Bilateral  cataracts Provider FOOT SURGERY Left  left-hammer toes Provider HAND SURGERY   right joints Provider IR IVC  FILTER PLMT / S&I /IMG GUID/MOD SED  06/17/2017  Provider IR US GUIDE VASC ACCESS RIGHT  06/17/2017  Provider JOINT REPLACEMENT   bilateral total knee replacement Provider NASAL SINUS SURGERY    Provider perineal abscess I & D   necrotizing soft tissue infection 2005 Provider ROTATOR CUFF REPAIR   bilateral Provider VAGINAL HYSTERECTOMY    Provider  Performing Technologist/Nurse    Performing Technologist/Nurse:  Implants     No active implants to display in this view. Order-Level Documents - 09/14/2012:   Scan on 09/19/2012 10:37 AM by Default, Provider, MDScan on 09/19/2012 10:37 AM by Default, Provider, MD  Scan on 09/19/2012 6:37 AM by Default, Provider, MDScan on 09/19/2012 6:37 AM by Default, Provider, MD    Encounter-Level Documents - 09/14/2012:   Scan on 10/05/2012 10:03 AM by Default, Provider, MDScan on 10/05/2012 10:03 AM by Default, Provider, MD  Scan on 09/19/2012 10:37 AM by Default, Provider, MDScan on 09/19/2012 10:37 AM by Default, Provider, MD  Electronic signature on 09/14/2012 5:38 PM    Printable Result Report    Result Report      Reproductive/Obstetrics                            Anesthesia Physical Anesthesia Plan  ASA: III  Anesthesia Plan: General   Post-op Pain Management:    Induction: Intravenous  PONV Risk Score and Plan: 3 and Ondansetron, Dexamethasone and Midazolam  Airway Management Planned: Oral ETT  Additional Equipment:   Intra-op Plan:   Post-operative Plan: Extubation in OR  Informed Consent: I have reviewed the patients History and Physical, chart, labs and discussed the procedure including the risks, benefits and alternatives for the proposed anesthesia with the patient or authorized representative who has indicated his/her understanding and acceptance.   Dental advisory given  Plan Discussed with: CRNA and Surgeon  Anesthesia Plan Comments:         Anesthesia Quick Evaluation

## 2017-06-20 NOTE — Transfer of Care (Signed)
Immediate Anesthesia Transfer of Care Note  Patient: Jocelyn Curtis  Procedure(s) Performed: Procedure(s) with comments: Thoracic 12 Lumbar 1, Lumabr Two-Lumbar Three LAMINECTOMY/FORAMINOTOMY (N/A) - Thoracic 12 Lumbar 1, Lumabr Two-Lumbar Three LAMINECTOMY/FORAMINOTOMY   Patient Location: PACU  Anesthesia Type:General  Level of Consciousness: awake, alert  and oriented  Airway & Oxygen Therapy: Patient Spontanous Breathing and Patient connected to face mask oxygen  Post-op Assessment: Report given to RN, Post -op Vital signs reviewed and stable and Patient moving all extremities X 4  Post vital signs: Reviewed and stable  Last Vitals:  Vitals:   06/20/17 0953  BP: (!) 150/65  Pulse: 75  Resp: 18  Temp: 36.7 C  SpO2: 98%    Last Pain:  Vitals:   06/20/17 0959  TempSrc:   PainSc: 8          Complications: No apparent anesthesia complications

## 2017-06-20 NOTE — Anesthesia Procedure Notes (Signed)
Procedure Name: Intubation Date/Time: 06/20/2017 11:43 AM Performed by: Gaylene Brooks Pre-anesthesia Checklist: Patient identified, Emergency Drugs available, Suction available and Patient being monitored Patient Re-evaluated:Patient Re-evaluated prior to induction Oxygen Delivery Method: Circle System Utilized Preoxygenation: Pre-oxygenation with 100% oxygen Induction Type: IV induction Ventilation: Mask ventilation without difficulty and Oral airway inserted - appropriate to patient size Laryngoscope Size: Sabra Heck and 2 Grade View: Grade I Tube type: Oral Tube size: 7.0 mm Number of attempts: 1 Airway Equipment and Method: Stylet and Oral airway Placement Confirmation: ETT inserted through vocal cords under direct vision,  positive ETCO2 and breath sounds checked- equal and bilateral Secured at: 22 cm Tube secured with: Tape Dental Injury: Teeth and Oropharynx as per pre-operative assessment

## 2017-06-20 NOTE — H&P (Signed)
Subjective: Patient is a 71 y.o. female admitted for stenosis. Onset of symptoms was several years ago, gradually worsening since that time.  The pain is rated severe, and is located at the across the lower back and radiates to legs. The pain is described as aching and occurs intermittently. The symptoms have been progressive. Symptoms are exacerbated by exercise. MRI or CT showed stenosis   Past Medical History:  Diagnosis Date  . AAA (abdominal aortic aneurysm) ([place])   . AAA (abdominal aortic aneurysm) ([place])    followed by dr Jeralene Peters for appt  . Abdominal aortic aneurysm without rupture [facility])    followed by Dr. Concepcion Elk  . Anxiety and depression   . Arthritis    "all over body"  . Blood transfusion 28yrs ago  . Bruises easily   . Cancer ([place])    HX SKIN CA  . Carotid artery occlusion   . Cerebrovascular disease    pt states she pulls to the left and staggers alot  . CHF (congestive heart failure) ([place])    USED TO SEE DR GROVE- not seen cardiac dr out of hospital since 2013  . Chronic back pain   . Complication of anesthesia    ENDED WITH BLOOD CLOTT IN LEG- "takes more anesthesia for me"  . COPD (chronic obstructive pulmonary disease) ([place])   . Coronary artery disease   . Depression    takes Paxil daily  . Diabetes mellitus    type II  . Dizziness   . Dizziness - giddy   . Dysrhythmia    -atrial fib hx no problems now  . Foot drop    right since knee replacement  . GERD (gastroesophageal reflux disease)    takes Protonix daily  . H/O blood clots    in right foot-04/24/11  . H/O: GI bleed   . History of bladder infections   . Hyperlipidemia    takes Pravastatin daily  . Hypertension    takes  Lisinopril daily, Dr. Ricci Barker. took care of pt. before his retirement   . Osteoarthritis   . Osteoarthritis (arthritis due to wear and tear of joints)   . Peripheral vascular disease ([place])    blood clot in foot following epidural anesthesia - 04/2011  . Pneumonia    hx  of;last time about 63yrs ago 2008  . Renal insufficiency    SEE MED DR Welton Flakes [place]  . Shortness of breath    with exertion  . Stroke [facility])    ministrokes many yrs. ago   . Subclavian steal syndrome   . Subclavian steal syndrome   . Vision loss    left eye    Past Surgical History:  Procedure Laterality Date  . APPENDECTOMY    . CARDIAC CATHETERIZATION    . CAROTID-SUBCLAVIAN BYPASS GRAFT  05/02/2012   Procedure: BYPASS GRAFT CAROTID-SUBCLAVIAN;  Surgeon: Mal Misty, MD;  Location: [place];  Service: Vascular;  Laterality: Left;  . CARPAL TUNNEL RELEASE Left   . COSMETIC SURGERY     face lift  . ENDARTERECTOMY  05/02/2012   Procedure: ENDARTERECTOMY SUBCLAVIAN;  Surgeon: Mal Misty, MD;  Location: [facility];  Service: Vascular;  Laterality: Left;  . EYE SURGERY Bilateral    cataracts  . FOOT SURGERY Left    left-hammer toes  . HAND SURGERY     right  joints  . IR IVC FILTER PLMT / S&I /IMG GUID/MOD SED  06/17/2017  . IR US GUIDE VASC ACCESS  RIGHT  06/17/2017  . JOINT REPLACEMENT     bilateral total knee replacement  . NASAL SINUS SURGERY    . perineal abscess I & D     necrotizing soft tissue infection 2005  . ROTATOR CUFF REPAIR     bilateral  . VAGINAL HYSTERECTOMY      Prior to Admission medications   Medication Sig Start Date End Date Taking? Authorizing Provider  aspirin 81 MG chewable tablet Chew 81 mg by mouth daily.   Yes [provider]  atorvastatin (LIPITOR) 80 MG tablet Take 80 mg by mouth every morning.    Yes [provider]  glimepiride (AMARYL) 4 MG tablet Take 8 mg by mouth daily with breakfast. Take 2 tablets every morning.    Yes [provider]  oxyCODONE-acetaminophen (PERCOCET) 10-325 MG tablet Take 1 tablet by mouth every 4 (four) hours as needed for pain.   Yes [provider]  pantoprazole (PROTONIX) 40 MG tablet Take 40 mg by mouth daily.   Yes [provider]  PARoxetine (PAXIL) 30 MG tablet  Take 30 mg by mouth every morning.    Yes [provider]  traZODone (DESYREL) 150 MG tablet Take 150 mg by mouth at bedtime.   Yes [provider]   Allergies  Allergen Reactions  . Lisinopril Swelling, Rash and Anaphylaxis  . Metformin And Related Anaphylaxis  . Nsaids Other (See Comments)    GI BLEED   . Penicillins Itching, Swelling and Rash    Abdominal swelling  . Codeine Itching and Nausea And Vomiting    Social History  Substance Use Topics  . Smoking status: Current Some Day Smoker    Packs/day: 0.50    Years: 48.00    Types: Cigarettes  . Smokeless tobacco: Former Systems developer    Types: Snuff    Quit date: 06/18/2016     Comment: uses snuff every once in a while  . Alcohol use 0.0 oz/week     Comment: occ beer    Family History  Problem Relation Age of Onset  . Diabetes Mother   . Heart disease Mother        before age 46  . Hyperlipidemia Mother   . Hypertension Mother   . Heart attack Mother   . Cancer Father   . Heart disease Father        before age 39  . Hyperlipidemia Father   . Hypertension Father   . Heart attack Father   . Deep vein thrombosis Sister   . Diabetes Sister   . Heart disease Sister   . Hyperlipidemia Sister   . Hypertension Sister   . Heart attack Sister   . Diabetes Brother   . Hypertension Brother   . Diabetes Daughter   . Anesthesia problems Neg Hx   . Hypotension Neg Hx   . Malignant hyperthermia Neg Hx   . Pseudochol deficiency Neg Hx      Review of Systems  Positive ROS: neg  All other systems have been reviewed and were otherwise negative with the exception of those mentioned in the HPI and as above.  Objective: Vital signs in last 24 hours: Temp:  [98.1 F (36.7 C)] 98.1 F (36.7 C) (09/20 0953) Pulse Rate:  [75] 75 (09/20 0953) Resp:  [18] 18 (09/20 0953) BP: (150)/(65) 150/65 (09/20 0953) SpO2:  [98 %] 98 % (09/20 0953) Weight:  [90.7 kg (200 lb)] 90.7 kg (200 lb) (09/20 0953)  General  Appearance: Alert,  cooperative, no distress, appears stated age Head: Normocephalic, without obvious abnormality, atraumatic Eyes: PERRL, conjunctiva/corneas clear, EOM's intact    Neck: Supple, symmetrical, trachea midline Back: Symmetric, no curvature, ROM normal, no CVA tenderness Lungs:  respirations unlabored Heart: Regular rate and rhythm Abdomen: Soft, non-tender Extremities: Extremities normal, atraumatic, no cyanosis or edema Pulses: 2+ and symmetric all extremities Skin: Skin color, texture, turgor normal, no rashes or lesions  NEUROLOGIC:   Mental status: Alert and oriented x4,  no aphasia, good attention span, fund of knowledge, and memory Motor Exam - grossly normal Sensory Exam - grossly normal Reflexes: trace Coordination - grossly normal Gait - not tested Balance - not tested Cranial Nerves: I: smell Not tested  II: visual acuity  OS: nl    OD: nl  II: visual fields Full to confrontation  II: pupils Equal, round, reactive to light  III,VII: ptosis None  III,IV,VI: extraocular muscles  Full ROM  V: mastication Normal  V: facial light touch sensation  Normal  V,VII: corneal reflex  Present  VII: facial muscle function - upper  Normal  VII: facial muscle function - lower Normal  VIII: hearing Not tested  IX: soft palate elevation  Normal  IX,X: gag reflex Present  XI: trapezius strength  5/5  XI: sternocleidomastoid strength 5/5  XI: neck flexion strength  5/5  XII: tongue strength  Normal    Data Review Lab Results  Component Value Date   WBC 9.6 06/18/2017   HGB 14.8 06/18/2017   HCT 45.8 06/18/2017   MCV 98.7 06/18/2017   PLT 182 06/18/2017   Lab Results  Component Value Date   NA 137 06/18/2017   K 4.5 06/18/2017   CL 102 06/18/2017   CO2 26 06/18/2017   BUN 22 (H) 06/18/2017   CREATININE 1.38 (H) 06/18/2017   GLUCOSE 131 (H) 06/18/2017   Lab Results  Component Value Date   INR 0.91 06/18/2017    Assessment/Plan: Patient admitted for  T12-L1 and L2-3 DLL. Patient has failed a reasonable attempt at conservative therapy.  I explained the condition and procedure to the patient and answered any questions.  Patient wishes to proceed with procedure as planned. Understands risks/ benefits and typical outcomes of procedure.   Berit Raczkowski S 06/20/2017 10:34 AM

## 2017-06-20 NOTE — Progress Notes (Signed)
Pharmacy Antibiotic Note  Jocelyn Curtis is a 71 y.o. female admitted on 06/20/2017 for laminectomy, facetectomy, and foraminotomy.  Pharmacy has been consulted for vancomycin dosing for surgical prophylaxis in patient with a drain.  Noted that patient received vancomycin 1gm at 1155 today.  Her SCr is also mildly elevated.   Plan: Vanc 1500mg  IV Q24H for goal trough 10-15 mcg/mL Monitor renal fxn, clinical progress, vanc trough as indicated   Height: 5\' 8"  (172.7 cm) Weight: 209 lb 12.8 oz (95.2 kg) IBW/kg (Calculated) : 63.9  Temp (24hrs), Avg:98 F (36.7 C), Min:97.3 F (36.3 C), Max:99 F (37.2 C)   Recent Labs Lab 06/17/17 0703 06/18/17 1053  WBC 9.7 9.6  CREATININE 1.65* 1.38*    Estimated Creatinine Clearance: 45.1 mL/min (A) (by C-G formula based on SCr of 1.38 mg/dL (H)).    Allergies  Allergen Reactions  . Lisinopril Swelling, Rash and Anaphylaxis  . Metformin And Related Anaphylaxis  . Nsaids Other (See Comments)    GI BLEED   . Penicillins Itching, Swelling and Rash    Abdominal swelling  . Codeine Itching and Nausea And Vomiting    Vanc 9/20 >>   Jocelyn Curtis D. Mina Marble, PharmD, BCPS Pager:  [phone] 06/20/2017, 7:33 PM

## 2017-06-20 NOTE — Progress Notes (Signed)
Pt received at 18:55pm from PACU. Pt on 3L of 02 via n/c denying shortness of breath. 02 sat 95%.  Surgical honeycomb dressing intact marked with old drainage. Hemovac in place. Pt oriented to room. Safety measures in place. Call bell within reach.

## 2017-06-20 NOTE — Op Note (Signed)
06/20/2017  1:42 PM  PATIENT:  Jocelyn Curtis  71 y.o. female  PRE-OPERATIVE DIAGNOSIS:  Spinal stenosis T12-L1, L1-L2, L2-L3  POST-OPERATIVE DIAGNOSIS:  same  PROCEDURE:  Decompressive laminectomy, medial facetectomy and foraminotomy T12-L1, L1 - 2, L2-L3  SURGEON:  Sherley Bounds, MD  ASSISTANTS: Dr. Sherwood Gambler  ANESTHESIA:   General  EBL: 150 ml  Total I/O In: -  Out: 150 [Blood:150]  BLOOD ADMINISTERED: none  DRAINS: Medium Hemovac  SPECIMEN:  none  INDICATION FOR PROCEDURE: This patient presented with severe back and leg pain with difficulty walking. Imaging showed severe spinal stenosis T12-L2. The patient tried conservative measures without relief. Pain was debilitating. Recommended decompressive laminectomy. Patient understood the risks, benefits, and alternatives and potential outcomes and wished to proceed.  PROCEDURE DETAILS: The patient was taken to the operating room and after induction of adequate generalized endotracheal anesthesia, the patient was rolled into the prone position on the Wilson frame and all pressure points were padded. The lumbar region was cleaned and then prepped with DuraPrep and draped in the usual sterile fashion. 5 cc of local anesthesia was injected and then a dorsal midline incision was made and carried down to the lumbo sacral fascia. The fascia was opened and the paraspinous musculature was taken down in a subperiosteal fashion to expose T12-L3. Intraoperative x-ray confirmed my level, and then I removed the T12-L1 and L2 spinous processes and used a combination of the high-speed drill and the Kerrison punches to perform a laminectomy, medial facetectomy, and foraminotomy at T12-L1, L1-L2, L2-L3. The underlying yellow ligament was opened and removed in a piecemeal fashion to expose the underlying dura and exiting nerve root. I undercut the lateral recess and dissected down until I was medial to and distal to the pedicle at each level. The nerve root  and central canal was well decompressed.  I then palpated with a coronary dilator along the nerve root and into the foramen to assure adequate decompression. I felt no more compression of the nerve roots and the central canal appeared to be well decompressed. I irrigated with saline solution containing bacitracin. Achieved hemostasis with bipolar cautery, lined the dura with Gelfoam, placed a medium Hemovac drain through a separate stab incision and then closed the fascia with 0 Vicryl. I closed the subcutaneous tissues with 2-0 Vicryl and the subcuticular tissues with 3-0 Vicryl. The skin was then closed with benzoin and Steri-Strips. The drapes were removed, a sterile dressing was applied. The patient was awakened from general anesthesia and transferred to the recovery room in stable condition. At the end of the procedure all sponge, needle and instrument counts were correct.    PLAN OF CARE: Admit for overnight observation  PATIENT DISPOSITION:  PACU - hemodynamically stable.   Delay start of Pharmacological VTE agent (>24hrs) due to surgical blood loss or risk of bleeding:  yes

## 2017-06-21 ENCOUNTER — Encounter (HOSPITAL_COMMUNITY): Payer: Self-pay | Admitting: Neurological Surgery

## 2017-06-21 DIAGNOSIS — M4804 Spinal stenosis, thoracic region: Secondary | ICD-10-CM | POA: Diagnosis not present

## 2017-06-21 LAB — GLUCOSE, CAPILLARY: GLUCOSE-CAPILLARY: 140 mg/dL — AB (ref 65–99)

## 2017-06-21 MED ORDER — ORAL CARE MOUTH RINSE
15.0000 mL | Freq: Two times a day (BID) | OROMUCOSAL | Status: DC
  Administered 2017-06-21 – 2017-06-24 (×5): 15 mL via OROMUCOSAL

## 2017-06-21 NOTE — Care Management Note (Signed)
Case Management Note Marvetta Gibbons RN, BSN Unit 4E-Case Manager-- 22M coverage [phone]  Patient Details  Name: Jocelyn Curtis MRN: [id number] Date of Birth: 01-26-1946  Subjective/Objective:  Pt admitted s/p T12-L3 decompressive laminectomies                   Action/Plan: PTA pt lived at home uses RW- has family that assist PRN, per PT eval recommendation for SNF- CSW consulted for possible placement  Expected Discharge Date:                  Expected Discharge Plan:  Skilled Nursing Facility  In-House Referral:  Clinical Social Work  Discharge planning Services  CM Consult  Post Acute Care Choice:  Durable Medical Equipment Choice offered to:     DME Arranged:    DME Agency:     HH Arranged:    [place] Agency:     Status of Service:  In process, will continue to follow  If discussed at Long Length of Stay Meetings, dates discussed:    Discharge Disposition:   Additional Comments:  Dawayne Patricia, RN 06/21/2017, 4:43 PM

## 2017-06-21 NOTE — Progress Notes (Signed)
Patient appeared to rest comfortably during the night.    Patient advised that she did not feel like she was ready to get up just yet.  RN educated patient on the importance of ambulation   Patient verbalized understanding

## 2017-06-21 NOTE — Progress Notes (Signed)
Patient ID: Jocelyn Curtis, female   DOB: 1946/06/19, 71 y.o.   MRN: [id number] Subjective: Patient reports she's doing better, less tingling in legs compared to pre-op, no leg pain   Objective: Vital signs in last 24 hours: Temp:  [97.3 F (36.3 C)-99.2 F (37.3 C)] 98.3 F (36.8 C) (09/21 0536) Pulse Rate:  [75-97] 78 (09/21 0536) Resp:  [12-25] 18 (09/21 0536) BP: (97-150)/(48-77) 124/58 (09/21 0536) SpO2:  [88 %-100 %] 95 % (09/21 0536) Weight:  [90.7 kg (200 lb)-95.2 kg (209 lb 12.8 oz)] 95.2 kg (209 lb 12.8 oz) (09/20 1854)  Intake/Output from previous day: 09/20 0701 - 09/21 0700 In: 1440 [P.O.:240; I.V.:1200] Out: 790 [Urine:450; Drains:190; Blood:150] Intake/Output this shift: No intake/output data recorded.  Neurologic: Grossly normal  Lab Results: Lab Results  Component Value Date   WBC 9.6 06/18/2017   HGB 14.8 06/18/2017   HCT 45.8 06/18/2017   MCV 98.7 06/18/2017   PLT 182 06/18/2017   Lab Results  Component Value Date   INR 0.91 06/18/2017   BMET Lab Results  Component Value Date   NA 137 06/18/2017   K 4.5 06/18/2017   CL 102 06/18/2017   CO2 26 06/18/2017   GLUCOSE 131 (H) 06/18/2017   BUN 22 (H) 06/18/2017   CREATININE 1.38 (H) 06/18/2017   CALCIUM 9.5 06/18/2017    Studies/Results: Dg Lumbar Spine 2-3 Views  Result Date: 06/20/2017 CLINICAL DATA:  Elective surgery EXAM: LUMBAR SPINE - 2-3 VIEW COMPARISON:  MRI 05/21/2017 FINDINGS: First cross-table lateral intraoperative image demonstrates a posterior needle directed at the L3 vertebral body. Second lateral intraoperative image demonstrates posterior surgical instruments directed at the L1-2 level. Diffuse degenerative disc disease and facet disease. IMPRESSION: Intraoperative localization as above. Electronically Signed   By: Rolm Baptise M.D.   On: 06/20/2017 15:25    Assessment/Plan: Doing reasonably well, mobilize with therapy today   LOS: 0 days    JONES,DAVID S 06/21/2017, 8:05  AM

## 2017-06-21 NOTE — Care Management Obs Status (Signed)
[facility] NOTIFICATION   Patient Details  Name: Jocelyn Curtis MRN: [id number] Date of Birth: 12/12/1945   Medicare Observation Status Notification Given:  Yes    Dawayne Patricia, RN 06/21/2017, 4:42 PM

## 2017-06-21 NOTE — Progress Notes (Signed)
Diabetes Nurse called regarding patient blood sugar control. She recommended calling Dr. Ronnald Ramp to order ACHS blood sugars and sliding scale insulin.Recent CBG is 161 on 9/20 and 140 on 9/21.    MD office was called, medical assistant Junie Panning stated that Dr. Ronnald Ramp does not prescribe insulin.   Will attempt to contact MD for diabetes management.  Notified diabetic coordinator of Dr. Ronnald Ramp' office recommendation.

## 2017-06-21 NOTE — Progress Notes (Signed)
Patient pain level remained elevated most of the day. RN attempted pain control with repositioning and medication. Patient requested pain medication frequently for pain above 7/10, however, due to decreased respirations and lower BP, pain medications were not administered as frequently as they were requested.

## 2017-06-21 NOTE — Progress Notes (Signed)
CSW provided patient list of bed offers. Patient requested that CSW check in with [facility] on bed availability; [facility] has bed available, but is out of network with the patient's [employer], so she would be responsible for 40% coinsurance with her out of network benefit.   Patient also has manual review PASRR; will need 30 day note for SNF placement.   CSW to continue to follow for SNF placement.  Laveda Abbe, [place] Clinical Social Worker [phone]

## 2017-06-21 NOTE — Evaluation (Signed)
Physical Therapy Evaluation Patient Details Name: Jocelyn Curtis MRN: [id number] DOB: [birth year] Today's Date: 06/21/2017   History of Present Illness  Pt is a 71 y/o female s/p T12-L3 decompressive laminectomies. PMH including but not limited to CVD, CAD, CHF, HTN, DM and COPD.  Clinical Impression  Pt presented supine in bed with HOB elevated, awake and willing to participate in therapy session. Prior to admission, pt reported that she ambulates with use of RW when outside of her home and asks for assistance with ADLs when family is available to provide it. Pt lives with her daughter but states that she is home alone most of the time. Pt currently requires min guard for safety, min A for transfers and ambulated a short distance within her room with min guard and use of RW. PT recommending pt d/c to SNF for ST rehab prior to returning home to maximize her independence with functional mobility. Pt would continue to benefit from skilled physical therapy services at this time while admitted and after d/c to address the below listed limitations in order to improve overall safety and independence with functional mobility.    Follow Up Recommendations SNF;Supervision/Assistance - 24 hour    Equipment Recommendations  None recommended by PT    Recommendations for Other Services       Precautions / Restrictions Precautions Precautions: Back;Fall Precaution Booklet Issued: No Precaution Comments: Pt able to recall 2/3 back precautions. PT reviewed all precautions and log roll technique. Required Braces or Orthoses:  ("no brace need" - MD orders) Restrictions Weight Bearing Restrictions: No      Mobility  Bed Mobility Overal bed mobility: Needs Assistance Bed Mobility: Rolling;Sidelying to Sit Rolling: Min guard Sidelying to sit: Min assist       General bed mobility comments: increased time, cueing for log roll technique, use of bed rails, min A to elevate trunk to achieve sitting  EOB  Transfers Overall transfer level: Needs assistance Equipment used: Rolling walker (2 wheeled) Transfers: Sit to/from Stand Sit to Stand: Min assist         General transfer comment: increased time, cueing for hand placement, assist for stability with rise from EOB  Ambulation/Gait Ambulation/Gait assistance: Min guard Ambulation Distance (Feet): 30 Feet Assistive device: Rolling walker (2 wheeled) Gait Pattern/deviations: Step-through pattern;Decreased step length - right;Decreased step length - left;Decreased stride length;Decreased dorsiflexion - right;Shuffle;Trunk flexed Gait velocity: decreased Gait velocity interpretation: Below normal speed for age/gender General Gait Details: mild instability but no overt LOB or need for physical assistance, cueing to maintain a safe distance from [employer] Mobility    Modified Rankin (Stroke Patients Only)       Balance Overall balance assessment: Needs assistance Sitting-balance support: Feet supported Sitting balance-Leahy Scale: Fair     Standing balance support: During functional activity;Bilateral upper extremity supported;Single extremity supported Standing balance-Leahy Scale: Poor Standing balance comment: reliant on at least one UE support for balance                             Pertinent Vitals/Pain Pain Assessment: Faces Faces Pain Scale: Hurts even more Pain Location: back Pain Descriptors / Indicators: Sore Pain Intervention(s): Monitored during session;Repositioned    Home Living Family/patient expects to be discharged to:: Private residence Living Arrangements: Children Available Help at Discharge: Family;Available PRN/intermittently Type of Home: Mobile home Home Access: Stairs to enter Entrance  Stairs-Rails: Psychiatric nurse of Steps: 6 Home Layout: One level Home Equipment: Walker - 2 wheels      Prior Function Level of Independence:  Needs assistance   Gait / Transfers Assistance Needed: ambulates with use of RW if out in the community  ADL's / Homemaking Assistance Needed: occasionally requires assistance when family is available to provide it        Hand Dominance        Extremity/Trunk Assessment   Upper Extremity Assessment Upper Extremity Assessment: Overall WFL for tasks assessed    Lower Extremity Assessment Lower Extremity Assessment: Generalized weakness    Cervical / Trunk Assessment Cervical / Trunk Assessment: Other exceptions Cervical / Trunk Exceptions: s/p thoracic and lumbar sx  Communication      Cognition Arousal/Alertness: Awake/alert Behavior During Therapy: WFL for tasks assessed/performed Overall Cognitive Status: Impaired/Different from baseline Area of Impairment: Memory;Following commands;Safety/judgement;Problem solving                     Memory: Decreased short-term memory;Decreased recall of precautions Following Commands: Follows one step commands consistently Safety/Judgement: Decreased awareness of safety   Problem Solving: Difficulty sequencing;Requires verbal cues;Requires tactile cues        General Comments      Exercises     Assessment/Plan    PT Assessment Patient needs continued PT services  PT Problem List Decreased strength;Decreased activity tolerance;Decreased balance;Decreased mobility;Decreased coordination;Decreased cognition;Decreased knowledge of use of DME;Decreased safety awareness;Decreased knowledge of precautions;Pain       PT Treatment Interventions DME instruction;Gait training;Stair training;Therapeutic activities;Functional mobility training;Therapeutic exercise;Balance training;Neuromuscular re-education;Patient/family education;Cognitive remediation    PT Goals (Current goals can be found in the Care Plan section)  Acute Rehab PT Goals Patient Stated Goal: to get stronger to be able to take care of herself PT Goal  Formulation: With patient Time For Goal Achievement: 07/05/17 Potential to Achieve Goals: Good    Frequency Min 5X/week   Barriers to discharge Decreased caregiver support      Co-evaluation               AM-PAC PT "6 Clicks" Daily Activity  Outcome Measure Difficulty turning over in bed (including adjusting bedclothes, sheets and blankets)?: A Lot Difficulty moving from lying on back to sitting on the side of the bed? : Unable Difficulty sitting down on and standing up from a chair with arms (e.g., wheelchair, bedside commode, etc,.)?: Unable Help needed moving to and from a bed to chair (including a wheelchair)?: A Little Help needed walking in hospital room?: A Little Help needed climbing 3-5 steps with a railing? : A Lot 6 Click Score: 12    End of Session Equipment Utilized During Treatment: Gait belt Activity Tolerance: Patient limited by pain;Patient limited by fatigue Patient left: in chair;with call bell/phone within reach;with chair alarm set Nurse Communication: Mobility status PT Visit Diagnosis: Other abnormalities of gait and mobility (R26.89);Pain Pain - part of body:  (back)    Time: 9767-3419 PT Time Calculation (min) (ACUTE ONLY): 24 min   Charges:   PT Evaluation $PT Eval Moderate Complexity: 1 Mod PT Treatments $Gait Training: 8-22 mins   PT G Codes:   PT G-Codes **NOT FOR INPATIENT CLASS** Functional Assessment Tool Used: AM-PAC 6 Clicks Basic Mobility;Clinical judgement Functional Limitation: Mobility: Walking and moving around Mobility: Walking and Moving Around Current Status (F7902): At least 60 percent but less than 80 percent impaired, limited or restricted Mobility: Walking and Moving Around Goal Status [phone]):  At least 20 percent but less than 40 percent impaired, limited or restricted    [facility], [place], DPT [place] 06/21/2017, 9:46 AM

## 2017-06-21 NOTE — Progress Notes (Signed)
Inpatient Diabetes Program Recommendations  AACE/ADA: New Consensus Statement on Inpatient Glycemic Control (2015)  Target Ranges:  Prepandial:   less than 140 mg/dL      Peak postprandial:   less than 180 mg/dL (1-2 hours)      Critically ill patients:  140 - 180 mg/dL   Lab Results  Component Value Date   GLUCAP 161 (H) 06/20/2017   HGBA1C 6.9 (H) 06/18/2017    Review of Glycemic ControlResults for MATSUKO, KRETZ (MRN [id number]) as of 06/21/2017 10:19  Ref. Range 06/20/2017 09:53 06/20/2017 13:46  Glucose-Capillary Latest Ref Range: 65 - 99 mg/dL 158 (H) 161 (H)   Diabetes history: Type 2 diabetes Outpatient Diabetes medications: Amaryl 8 mg daily Current orders for Inpatient glycemic control:  Amaryl 8 mg daily  Inpatient Diabetes Program Recommendations:    Consider holding Amaryl 8 mg daily while patient is in the hospital.  Also please add Novolog meal coverage sensitive tid with meals and HS per glycemic control order set.    Thanks, Adah Perl, RN, BC-ADM Inpatient Diabetes Coordinator Pager [phone] (8a-5p)

## 2017-06-21 NOTE — Clinical Social Work Note (Signed)
Clinical Social Work Assessment  Patient Details  Name: Jocelyn Curtis MRN: [id number] Date of Birth: 1946/08/29  Date of referral:  06/21/17               Reason for consult:  Facility Placement, Discharge Planning                Permission sought to share information with:  Facility Sport and exercise psychologist, Family Supports Permission granted to share information::  Yes, Verbal Permission Granted  Name::     Physicist, medical::  SNF  Relationship::  Daughter-POA  Contact Information:     Housing/Transportation Living arrangements for the past 2 months:  Single Family Home Source of Information:  Patient Patient Interpreter Needed:  None Criminal Activity/Legal Involvement Pertinent to Current Situation/Hospitalization:  No - Comment as needed Significant Relationships:  Adult Children, Other Family Members Lives with:  Self, Adult Children Do you feel safe going back to the place where you live?  Yes Need for family participation in patient care:  No (Coment)  Care giving concerns:  Patient currently resides with her youngest daughter, but does not receive help from her because she isn't home during the day. Patient will benefit from short term rehab to improve mobility and ability to care for herself before returning home.   Social Worker assessment / plan:  CSW met with patient to discuss recommendation for SNF placement. CSW discussed with patient if she was aware of any facilities in the area for rehab. CSW discussed with patient her current living situation. CSW faxed out referral. CSW to follow up with bed offers and determine facility choice to initiate insurance authorization.  Employment status:  Retired Nurse, adult PT Recommendations:  [place] / Referral to community resources:  [place]  Patient/Family's Response to care:  Patient is agreeable to SNF placement.  Patient/Family's Understanding of and  Emotional Response to Diagnosis, Current Treatment, and Prognosis:  Patient discussed that she doesn't like having to live with her daughter, because she is concerned about some of the things that her daughter does in her free time. Patient discussed other possible living arrangements, and how she didn't want to bother other people with her care, so she continues to stay with her youngest daughter even though she doesn't like it. Patient requested that CSW not talk about how she doesn't like living with her daughter to anyone else, though. Patient is hopeful that she will be able to get stronger at rehab and be able to take care of herself better, so she could be more independent and not rely on others. Patient indicated understanding of CSW role in discharge planning and appreciated the help.  Emotional Assessment Appearance:  Appears stated age Attitude/Demeanor/Rapport:    Affect (typically observed):  Appropriate, Tearful/Crying Orientation:  Oriented to Self, Oriented to Place, Oriented to  Time, Oriented to Situation Alcohol / Substance use:  Not Applicable Psych involvement (Current and /or in the community):  No (Comment)  Discharge Needs  Concerns to be addressed:  Care Coordination Readmission within the last 30 days:  No Current discharge risk:  Physical Impairment Barriers to Discharge:  Continued Medical Work up, Ship broker, Programmer, applications ([place])   Geralynn Ochs, LCSW 06/21/2017, 5:47 PM

## 2017-06-21 NOTE — NC FL2 (Signed)
[place] LEVEL OF CARE SCREENING TOOL     IDENTIFICATION  Patient Name: Jocelyn Curtis Birthdate: Jul 14, 1946 Sex: female Admission Date (Current Location): 06/20/2017  [facility] and [place] Number:  [employer] and Address:  [facility] [place], [place] [address], [place], [place] [address]      Provider Number: [id number]  Attending Physician Name and Address:  Eustace Moore, MD  Relative Name and Phone Number:       Current Level of Care: Hospital Recommended Level of Care: [place] Prior Approval Number:    Date Approved/Denied:   PASRR Number: Pending; manual review  Discharge Plan: SNF    Current Diagnoses: Patient Active Problem List   Diagnosis Date Noted  . S/P lumbar laminectomy 06/20/2017  . Occlusion and stenosis of carotid artery without mention of cerebral infarction 12/09/2012  . 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 Hypotension 09/16/2012  . Altered mental status 09/15/2012  . Polypharmacy 09/15/2012  . Encephalopathy acute 09/15/2012  . TIA (transient ischemic attack) 09/15/2012  . Weakness 09/14/2012  . 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    Orientation RESPIRATION BLADDER Height & Weight     Self, Time, Situation, Place  O2 ([place] 3L) Continent Weight: 209 lb 12.8 oz (95.2 kg) Height:  5\' 8"  (172.7 cm)  BEHAVIORAL SYMPTOMS/MOOD NEUROLOGICAL BOWEL NUTRITION STATUS      Continent Diet (cardiac)  AMBULATORY STATUS COMMUNICATION OF NEEDS Skin   Limited Assist Verbally Surgical wounds (closed right neck, 06/17/17; closed back, 06/20/17, honeycomb dressing)                       Personal Care Assistance Level of Assistance  Bathing, Dressing Bathing Assistance: Maximum assistance   Dressing Assistance: Maximum assistance     Functional Limitations Info             SPECIAL CARE FACTORS FREQUENCY  PT (By licensed PT), OT (By licensed OT)     PT Frequency: 5x/wk OT  Frequency: 5x/wk            Contractures      Additional Factors Info  Code Status, Allergies, Psychotropic Code Status Info: Full Allergies Info: Lisinopril, Metformin And Related, Nsaids, Penicillins, Codeine Psychotropic Info: Paxil 30mg  daily         Current Medications (06/21/2017):  This is the current hospital active medication list Current Facility-Administered Medications  Medication Dose Route Frequency Provider Last Rate Last Dose  . 0.9 %  sodium chloride infusion  250 mL Intravenous Continuous Eustace Moore, MD      . 0.9 % NaCl with KCl 20 mEq/ L  infusion   Intravenous Continuous Eustace Moore, MD      . acetaminophen (TYLENOL) tablet 650 mg  650 mg Oral Q4H PRN Eustace Moore, MD       Or  . acetaminophen (TYLENOL) suppository 650 mg  650 mg Rectal Q4H PRN Eustace Moore, MD      . aspirin chewable tablet 81 mg  81 mg Oral Daily Eustace Moore, MD   81 mg at 06/21/17 1016  . glimepiride (AMARYL) tablet 8 mg  8 mg Oral Q breakfast Eustace Moore, MD   8 mg at 06/21/17 0845  . MEDLINE mouth rinse  15 mL Mouth Rinse BID Eustace Moore, MD   15 mL at 06/21/17 1155  . menthol-cetylpyridinium (CEPACOL) lozenge 3 mg  1 lozenge Oral PRN Sherley Bounds  S, MD       Or  . phenol (CHLORASEPTIC) mouth spray 1 spray  1 spray Mouth/Throat PRN Eustace Moore, MD      . methocarbamol (ROBAXIN) tablet 500 mg  500 mg Oral Q6H PRN Eustace Moore, MD   500 mg at 06/21/17 1610  . morphine 2 MG/ML injection 2 mg  2 mg Intravenous Q2H PRN Eustace Moore, MD   2 mg at 06/21/17 0908  . ondansetron (ZOFRAN) tablet 4 mg  4 mg Oral Q6H PRN Eustace Moore, MD       Or  . ondansetron [facility]) injection 4 mg  4 mg Intravenous Q6H PRN Eustace Moore, MD      . oxyCODONE-acetaminophen (PERCOCET/ROXICET) 5-325 MG per tablet 1 tablet  1 tablet Oral Q4H PRN Eustace Moore, MD   1 tablet at 06/21/17 [phone]   And  . oxyCODONE (Oxy IR/ROXICODONE) immediate release tablet 5 mg  5 mg Oral Q4H PRN Eustace Moore, MD   5 mg at 06/21/17 0641  . pantoprazole (PROTONIX) EC tablet 40 mg  40 mg Oral Daily Eustace Moore, MD   40 mg at 06/21/17 1016  . PARoxetine (PAXIL) tablet 30 mg  30 mg Oral Daily Eustace Moore, MD   30 mg at 06/21/17 1017  . senna (SENOKOT) tablet 8.6 mg  1 tablet Oral BID Eustace Moore, MD   8.6 mg at 06/20/17 2105  . sodium chloride flush (NS) 0.9 % injection 3 mL  3 mL Intravenous Q12H Eustace Moore, MD   3 mL at 06/21/17 1154  . sodium chloride flush (NS) 0.9 % injection 3 mL  3 mL Intravenous PRN Eustace Moore, MD      . traZODone (DESYREL) tablet 150 mg  150 mg Oral QHS Eustace Moore, MD   150 mg at 06/20/17 2105     Discharge Medications: Please see discharge summary for a list of discharge medications.  Relevant Imaging Results:  Relevant Lab Results:   Additional Information SS#: [id number]  Geralynn Ochs, LCSW

## 2017-06-22 DIAGNOSIS — M4804 Spinal stenosis, thoracic region: Secondary | ICD-10-CM | POA: Diagnosis not present

## 2017-06-22 MED ORDER — METHOCARBAMOL 500 MG PO TABS: 500.0000 mg | ORAL_TABLET | Freq: Four times a day (QID) | ORAL | 2 refills | Status: DC | PRN

## 2017-06-22 MED ORDER — OXYCODONE-ACETAMINOPHEN 5-325 MG PO TABS: 1.0000 | ORAL_TABLET | ORAL | 0 refills | Status: DC | PRN

## 2017-06-22 NOTE — Progress Notes (Signed)
Late entry for 06/21/17 @ 1925 Received report from [place], Educational psychologist, who reported to me that patient is diabetic, that "the DM RN attempted to speak with Dr Ronnald Ramp regarding checks and coverage and was unsuccessful" She reported to me that "everyone is aware" and it should be addressed in the am. Patient is pleasant, honeycomb dressing has some stain, but is intact. Has a history of COPD and is wearing 3L, 02, however patient states to me that she does not wear oxygen at home. She reports that she smokes approximately 1 PPD of cigarettes and would like to have a nicotine patch. We talked about it and she was agreeable to wait to morning for order. She is alert and oriented X 4 and has been shown the call bell and the phone and how to use both. Safety maintained.

## 2017-06-22 NOTE — Progress Notes (Signed)
Pt seen and examined.  No issues overnight. Pain appropriately controlled Ambulating with PT/OT. Rec SNF  EXAM: Temp:  [98.1 F (36.7 C)-99.3 F (37.4 C)] 98.1 F (36.7 C) (09/22 0952) Pulse Rate:  [73-85] 74 (09/22 0952) Resp:  [11-20] 20 (09/22 0952) BP: (97-132)/(46-76) 100/58 (09/22 0952) SpO2:  [95 %-100 %] 97 % (09/22 0952) FiO2 (%):  [32 %] 32 % (09/21 1134) Intake/Output      09/21 0701 - 09/22 0700 09/22 0701 - 09/23 0700   P.O. 1580    Total Intake(mL/kg) 1580 (16.6)    Urine (mL/kg/hr) 1100 (0.5)    Drains 72    Total Output 1172     Net +408          Urine Occurrence 5 x     Awake and alert Follows commands throughout Full strength Wound: dried blood, otherwise no signs of infection, flat.  Plan Progressing nicely Agree with SNF placement Will sign orders for discharge should bed become available. This was communicated with nursing

## 2017-06-22 NOTE — Progress Notes (Signed)
Physical Therapy Treatment Patient Details Name: Jocelyn Curtis MRN: [id number] DOB: 1946/01/09 Today's Date: 06/22/2017    History of Present Illness Pt is a 71 y/o female s/p T12-L3 decompressive laminectomies. PMH including but not limited to CVD, CAD, CHF, HTN, DM and COPD.    PT Comments    Patient was able to recall 3/3 back precautions and tolerated increase in gait distance this session. Pt required min guard for OOB mobility physically and mod multimodal cues for posture and gait mechanics. Current plan remains appropriate.    Follow Up Recommendations  SNF;Supervision/Assistance - 24 hour     Equipment Recommendations  None recommended by PT    Recommendations for Other Services       Precautions / Restrictions Precautions Precautions: Back;Fall Precaution Comments: pt was able to recall 3/3 precautions beginning of session Required Braces or Orthoses:  ("no brace need" - MD orders) Restrictions Weight Bearing Restrictions: No    Mobility  Bed Mobility               General bed mobility comments: OOB in chair upon arrival  Transfers Overall transfer level: Needs assistance Equipment used: Rolling walker (2 wheeled) Transfers: Sit to/from Stand Sit to Stand: Min guard         General transfer comment: min guard for safety; cues for safe hand placement and technique when returning to sit  Ambulation/Gait Ambulation/Gait assistance: Min guard Ambulation Distance (Feet): 65 Feet Assistive device: Rolling walker (2 wheeled) Gait Pattern/deviations: Step-through pattern;Decreased step length - right;Decreased step length - left;Decreased stride length;Decreased dorsiflexion - right;Trunk flexed Gait velocity: decreased   General Gait Details: multimodal cues for posture and forward gaze; vc for proximity of RW; 2 brief standing breaks to adjust posture    Stairs            Wheelchair Mobility    Modified Rankin (Stroke Patients Only)        Balance Overall balance assessment: Needs assistance Sitting-balance support: Feet supported Sitting balance-Leahy Scale: Fair     Standing balance support: During functional activity;Bilateral upper extremity supported;Single extremity supported Standing balance-Leahy Scale: Fair                              Cognition Arousal/Alertness: Awake/alert Behavior During Therapy: WFL for tasks assessed/performed Overall Cognitive Status: Within Functional Limits for tasks assessed                                        Exercises      General Comments General comments (skin integrity, edema, etc.): SpO2 at lowest 88% on RA and up to 93% on RA      Pertinent Vitals/Pain Pain Assessment: Faces Faces Pain Scale: Hurts little more Pain Location: back and shoulders  Pain Descriptors / Indicators: Sore Pain Intervention(s): Limited activity within patient's tolerance;Monitored during session;Premedicated before session;Repositioned    Home Living                      Prior Function            PT Goals (current goals can now be found in the care plan section) Acute Rehab PT Goals Patient Stated Goal: to get stronger to be able to take care of herself PT Goal Formulation: With patient Time For Goal Achievement: 07/05/17 Potential to Achieve Goals:  Good Progress towards PT goals: Progressing toward goals    Frequency    Min 5X/week      PT Plan Current plan remains appropriate    Co-evaluation              AM-PAC PT "6 Clicks" Daily Activity  Outcome Measure  Difficulty turning over in bed (including adjusting bedclothes, sheets and blankets)?: A Lot Difficulty moving from lying on back to sitting on the side of the bed? : Unable Difficulty sitting down on and standing up from a chair with arms (e.g., wheelchair, bedside commode, etc,.)?: Unable Help needed moving to and from a bed to chair (including a wheelchair)?: A  Little Help needed walking in hospital room?: A Little Help needed climbing 3-5 steps with a railing? : A Lot 6 Click Score: 12    End of Session Equipment Utilized During Treatment: Gait belt Activity Tolerance: Patient tolerated treatment well Patient left: in chair;with call bell/phone within reach Nurse Communication: Mobility status PT Visit Diagnosis: Other abnormalities of gait and mobility (R26.89);Pain Pain - part of body:  (back)     Time: 3474-2595 PT Time Calculation (min) (ACUTE ONLY): 27 min  Charges:  $Gait Training: 8-22 mins $Therapeutic Activity: 8-22 mins                    G Codes:       Earney Navy, PTA Pager: [phone]     Darliss Cheney 06/22/2017, 12:21 PM

## 2017-06-22 NOTE — Clinical Social Work Note (Signed)
CSW notified patient that she would have to pay 40% out-of-pocket (around $320 per day) to go to [facility]. Reviewed other bed offers and patient chooses [facility]. Admissions coordinator notified. Patient's PASARR pending. CSW faxed requested documents to [facility] for review.  Must website is currently not allowing CSW to sign in to notify them that fax was sent. CSW will try again later.   Patient will be unable to discharge to SNF until PASARR obtained. CSW asked admissions coordinator about LOG once PASARR is obtained due to needing prior insurance authorization. She stated that it would have to be approved by administration but she doubts it will be because [place] [employer] has started denying authorizations when they admit patients before they get approval.  Dayton Scrape, [place]

## 2017-06-22 NOTE — Evaluation (Addendum)
Occupational Therapy Evaluation Patient Details Name: Jocelyn Curtis MRN: [id number] DOB: [birth year] Today's Date: 06/22/2017    History of Present Illness Pt is a 71 y/o female s/p T12-L3 decompressive laminectomies. PMH including but not limited to CVD, CAD, CHF, HTN, DM and COPD.   Clinical Impression   PTA, pt was living with her daughter and was performing ADLs with supervision and assistance as needed. Pt currently requiring Min Guard for ADLs in standing, Min A for UB ADLs, and Max A for LB ADLs. Pt presenting with decreased activity tolerance and requiring increased time for ADLs. Pt would benefit from further OT to facilitate safe dc. Recommend dc to SNF for further OT to increase safety and independence with ADLs and functional mobility prior to returning home.     Follow Up Recommendations  SNF;Supervision/Assistance - 24 hour    Equipment Recommendations  Other (comment) (Defer to next venue)    Recommendations for Other Services PT consult     Precautions / Restrictions Precautions Precautions: Back;Fall Precaution Booklet Issued: Yes (comment) Precaution Comments: Reviewed back handout. Pt able to rcall 3/3 back precautions. Requiring Min Cues for back precautions during ADLs.  Required Braces or Orthoses:  ("no brace need" - MD orders) Restrictions Weight Bearing Restrictions: No      Mobility Bed Mobility               General bed mobility comments: In recliner upon arrival. Pt verbalizing understanding of log roll technique.  Transfers Overall transfer level: Needs assistance Equipment used: Rolling walker (2 wheeled) Transfers: Sit to/from Stand Sit to Stand: Min guard         General transfer comment: increased time, cueing for hand placement and safety.     Balance Overall balance assessment: Needs assistance Sitting-balance support: Feet supported Sitting balance-Leahy Scale: Fair     Standing balance support: During functional  activity;Bilateral upper extremity supported;Single extremity supported Standing balance-Leahy Scale: Fair Standing balance comment: Able to maintain static standing at sink for grooming with Min Guard for safety. However, would gradually bend towards sicks with fatigue.                            ADL either performed or assessed with clinical judgement   ADL Overall ADL's : Needs assistance/impaired Eating/Feeding: Set up;Sitting   Grooming: Oral care;Min guard;Standing   Upper Body Bathing: Minimal assistance;Sitting   Lower Body Bathing: Maximal assistance;Sit to/from stand   Upper Body Dressing : Minimal assistance;Sitting   Lower Body Dressing: Maximal assistance;Sit to/from stand   Toilet Transfer: Min guard;Cueing for sequencing;Cueing for safety;Ambulation;RW (Simulated to recliner)           Functional mobility during ADLs: Min guard;Rolling walker General ADL Comments: Pt demonstrating decreased functional performance and requires increased time to complete ADLs. Pt will need further education on AE for LB ADLs and toileting. Pt SpO2 76 on roomair. Placed pt back on 3L via N/C and SpO2 went to 97. SpO2 dropped to 93 during activity on 3L O2.      Vision         Perception     Praxis      Pertinent Vitals/Pain Pain Assessment: 0-10 Pain Location: back Pain Descriptors / Indicators: Sore Pain Intervention(s): Monitored during session;Patient requesting pain meds-RN notified;Repositioned     Hand Dominance Right   Extremity/Trunk Assessment Upper Extremity Assessment Upper Extremity Assessment: RUE deficits/detail;LUE deficits/detail RUE Deficits / Details: Pt reports Bil  shoulder problems with limited AROM RUE Coordination: decreased gross motor LUE Deficits / Details: Pt reports Bil shoulder problems with limited AROM LUE Coordination: decreased gross motor   Lower Extremity Assessment Lower Extremity Assessment: Generalized weakness    Cervical / Trunk Assessment Cervical / Trunk Assessment: Other exceptions Cervical / Trunk Exceptions: s/p thoracic and lumbar sx   Communication Communication Communication: No difficulties   Cognition Arousal/Alertness: Awake/alert Behavior During Therapy: WFL for tasks assessed/performed Overall Cognitive Status: Impaired/Different from baseline Area of Impairment: Memory;Following commands;Safety/judgement;Problem solving                     Memory: Decreased short-term memory;Decreased recall of precautions Following Commands: Follows one step commands consistently Safety/Judgement: Decreased awareness of safety   Problem Solving: Difficulty sequencing;Requires verbal cues;Requires tactile cues General Comments: Requiring cues for back precautions during ADLs   General Comments  SpO2 dropping to 76 on roomair. notified RN    Exercises     Shoulder Instructions      Home Living Family/patient expects to be discharged to:: Private residence Living Arrangements: Children (Daughter) Available Help at Discharge: Family;Available PRN/intermittently Type of Home: Mobile home Home Access: Stairs to enter Entrance Stairs-Number of Steps: 6 Entrance Stairs-Rails: Right;Left Home Layout: One level     Bathroom Shower/Tub: Tub/shower unit         Home Equipment: Environmental consultant - 2 wheels   Additional Comments: Pt reports she is planning to go home to her oldest daughter's home (not the daughter she lives with normally) and someone can be there more often      Prior Functioning/Environment Level of Independence: Needs assistance  Gait / Transfers Assistance Needed: ambulates with use of RW if out in the community ADL's / Homemaking Assistance Needed: occasionally requires assistance when family is available to provide it   Comments: Pt reports no home o2        OT Problem List: Decreased strength;Decreased range of motion;Decreased activity tolerance;Impaired  balance (sitting and/or standing);Decreased safety awareness;Decreased knowledge of use of DME or AE;Decreased knowledge of precautions;Pain      OT Treatment/Interventions: Self-care/ADL training;Therapeutic exercise;Energy conservation;DME and/or AE instruction;Patient/family education;Therapeutic activities    OT Goals(Current goals can be found in the care plan section) Acute Rehab OT Goals Patient Stated Goal: to get stronger to be able to take care of herself OT Goal Formulation: With patient Time For Goal Achievement: 07/06/17 Potential to Achieve Goals: Good ADL Goals Pt Will Perform Grooming: with modified independence;standing Pt Will Perform Upper Body Dressing: sitting;with set-up;with supervision Pt Will Perform Lower Body Dressing: sit to/from stand;with supervision;with set-up;with adaptive equipment Pt Will Transfer to Toilet: with set-up;with supervision;ambulating;bedside commode (adhering to back precautions) Pt Will Perform Toileting - Clothing Manipulation and hygiene: with set-up;with supervision;sit to/from stand (adhering to back precautions)  OT Frequency: Min 2X/week   Barriers to D/C: Decreased caregiver support  Pt reporting that she will be alone at different times during the day       Co-evaluation              AM-PAC PT "6 Clicks" Daily Activity     Outcome Measure Help from another person eating meals?: None Help from another person taking care of personal grooming?: A Little Help from another person toileting, which includes using toliet, bedpan, or urinal?: A Little Help from another person bathing (including washing, rinsing, drying)?: A Lot Help from another person to put on and taking off regular upper body clothing?: A Little Help  from another person to put on and taking off regular lower body clothing?: A Lot 6 Click Score: 17   End of Session Equipment Utilized During Treatment: Gait belt;Rolling walker;Oxygen Nurse Communication:  Mobility status;Other (comment);Patient requests pain meds (SpO2)  Activity Tolerance: Patient tolerated treatment well;Patient limited by pain Patient left: in chair;with call bell/phone within reach;with chair alarm set  OT Visit Diagnosis: Unsteadiness on feet (R26.81);Other abnormalities of gait and mobility (R26.89);Muscle weakness (generalized) (M62.81);Pain Pain - Right/Left:  (Back) Pain - part of body:  (Back)                Time: 0938-1829 OT Time Calculation (min): 31 min Charges:  OT General Charges $OT Visit: 1 Visit OT Evaluation $OT Eval Moderate Complexity: 1 Mod OT Treatments $Self Care/Home Management : 8-22 mins G-Codes: OT G-codes **NOT FOR INPATIENT CLASS** Functional Assessment Tool Used: Clinical judgement Functional Limitation: Self care Self Care Current Status (H3716): At least 40 percent but less than 60 percent impaired, limited or restricted Self Care Goal Status (R6789): At least 1 percent but less than 20 percent impaired, limited or restricted   [place], OTR/L Acute Rehab Pager: [phone] Office: [place] 06/22/2017, 9:04 AM

## 2017-06-22 NOTE — Discharge Summary (Signed)
Physician Discharge Summary  Patient ID: Jocelyn Curtis MRN: [id number] DOB/AGE: May 21, 1946 71 y.o.  Admit date: 06/20/2017 Discharge date: 06/22/2017  Admission Diagnoses:  Lumbar radiculopathy  Discharge Diagnoses:  Same Active Problems:   S/P lumbar laminectomy  Discharged Condition: Stable  Hospital Course:  Jocelyn Curtis is a 71 y.o. female who was admitted for the below procedure. There were no post operative complications. At time of discharge, pain was well controlled, ambulating with Pt/OT, tolerating po, voiding normal. Ready for discharge.  Treatments: Surgery Decompressive laminectomy, medial facetectomy and foraminotomy T12-L1, L1 - 2, L2-L3  Discharge Exam: Blood pressure (!) 100/58, pulse 74, temperature 98.1 F (36.7 C), temperature source Oral, resp. rate 20, height 5\' 8"  (1.727 m), weight 95.2 kg (209 lb 12.8 oz), SpO2 97 %. Awake, alert, oriented Speech fluent, appropriate CN grossly intact MAEW with good strength Wound c/d/i  Disposition: SNF  Discharge Instructions    Call MD for:  difficulty breathing, headache or visual disturbances    Complete by:  As directed    Call MD for:  persistant dizziness or light-headedness    Complete by:  As directed    Call MD for:  redness, tenderness, or signs of infection (pain, swelling, redness, odor or green/yellow discharge around incision site)    Complete by:  As directed    Call MD for:  severe uncontrolled pain    Complete by:  As directed    Call MD for:  temperature >100.4    Complete by:  As directed    Diet general    Complete by:  As directed    Driving Restrictions    Complete by:  As directed    Do not drive until given clearance.   Increase activity slowly    Complete by:  As directed    Lifting restrictions    Complete by:  As directed    Do not lift anything >10lbs. Avoid bending and twisting in awkward positions. Avoid bending at the back.   Remove dressing in 24 hours    Complete by:   As directed      Allergies as of 06/22/2017      Reactions   Lisinopril Swelling, Rash, Anaphylaxis   Metformin And Related Anaphylaxis   Nsaids Other (See Comments)   GI BLEED   Penicillins Itching, Swelling, Rash   Abdominal swelling   Codeine Itching, Nausea And Vomiting      Medication List    STOP taking these medications   oxyCODONE-acetaminophen 10-325 MG tablet Commonly known as:  PERCOCET Replaced by:  oxyCODONE-acetaminophen 5-325 MG tablet     TAKE these medications   aspirin 81 MG chewable tablet Chew 81 mg by mouth daily.   atorvastatin 80 MG tablet Commonly known as:  LIPITOR Take 80 mg by mouth every morning.   glimepiride 4 MG tablet Commonly known as:  AMARYL Take 8 mg by mouth daily with breakfast. Take 2 tablets every morning.   methocarbamol 500 MG tablet Commonly known as:  ROBAXIN Take 1 tablet (500 mg total) by mouth every 6 (six) hours as needed for muscle spasms.   oxyCODONE-acetaminophen 5-325 MG tablet Commonly known as:  PERCOCET/ROXICET Take 1 tablet by mouth every 4 (four) hours as needed for moderate pain. Replaces:  oxyCODONE-acetaminophen 10-325 MG tablet   pantoprazole 40 MG tablet Commonly known as:  PROTONIX Take 40 mg by mouth daily.   PARoxetine 30 MG tablet Commonly known as:  PAXIL Take 30 mg by mouth  every morning.   traZODone 150 MG tablet Commonly known as:  DESYREL Take 150 mg by mouth at bedtime.            Discharge Care Instructions        Start     Ordered   06/22/17 0000  methocarbamol (ROBAXIN) 500 MG tablet  Every 6 hours PRN     06/22/17 1035   06/22/17 0000  oxyCODONE-acetaminophen (PERCOCET/ROXICET) 5-325 MG tablet  Every 4 hours PRN     06/22/17 1035   06/22/17 0000  Increase activity slowly     06/22/17 1035   06/22/17 0000  Driving Restrictions    Comments:  Do not drive until given clearance.   06/22/17 1035   06/22/17 0000  Lifting restrictions    Comments:  Do not lift anything  >10lbs. Avoid bending and twisting in awkward positions. Avoid bending at the back.   06/22/17 1035   06/22/17 0000  Diet general     06/22/17 1035   06/22/17 0000  Remove dressing in 24 hours     06/22/17 1035   06/22/17 0000  Call MD for:  temperature >100.4     06/22/17 1035   06/22/17 0000  Call MD for:  severe uncontrolled pain     06/22/17 1035   06/22/17 0000  Call MD for:  redness, tenderness, or signs of infection (pain, swelling, redness, odor or green/yellow discharge around incision site)     06/22/17 1035   06/22/17 0000  Call MD for:  difficulty breathing, headache or visual disturbances     06/22/17 1035   06/22/17 0000  Call MD for:  persistant dizziness or light-headedness     06/22/17 1035     Follow-up Information    Tia Alert, MD Follow up.   Specialty:  Neurosurgery Contact information: [address] [place] [address] [phone]           Signed: Alyson Ingles 06/22/2017, 10:36 AM

## 2017-06-23 DIAGNOSIS — M4804 Spinal stenosis, thoracic region: Secondary | ICD-10-CM | POA: Diagnosis not present

## 2017-06-23 NOTE — Progress Notes (Signed)
Received bedside report from Gregory. Patient's hemovac's tubing snapped and Kehinde removed. Gauze dressing applied by Kehinde. Patient is experiencing pain; however her VS have been soft. I will assess her vitals and medicate accordingly. Pleasant. Safety Maintained.

## 2017-06-23 NOTE — Progress Notes (Signed)
Physical Therapy Treatment Patient Details Name: Jocelyn Curtis MRN: [id number] DOB: 04-11-1946 Today's Date: 06/23/2017    History of Present Illness Pt is a 71 y/o female s/p T12-L3 decompressive laminectomies. PMH including but not limited to CVD, CAD, CHF, HTN, DM and COPD.    PT Comments    Patient is progressing toward goals. Ambulated 110 feet with two standing rest breaks secondary to fatigue and supervision for safety. Still requiring VCs for forward trunk lean and pacing.  SNF recommendation remains appropriate to address deficits and maximize function for safe d/c to home. Will continue to see acutely and progress as tolerated.   Follow Up Recommendations  SNF;Supervision/Assistance - 24 hour     Equipment Recommendations  None recommended by PT    Recommendations for Other Services       Precautions / Restrictions Precautions Precautions: Back;Fall Precaution Comments: reviewed 3/3 surgical precautions and log roll technique with patient Required Braces or Orthoses:  ("no brace need" - MD orders) Restrictions Weight Bearing Restrictions: No    Mobility  Bed Mobility Overal bed mobility: Needs Assistance Bed Mobility: Rolling;Sidelying to Sit Rolling: Supervision Sidelying to sit: Supervision       General bed mobility comments: Pt verbalized understanding of log roll technique, no physical assistance provided; supervision for safety and compliance with precautions   Transfers Overall transfer level: Needs assistance Equipment used: Rolling walker (2 wheeled) Transfers: Sit to/from Stand Sit to Stand: Min guard         General transfer comment: min guard for safety; VCs for safety and posture when returning to sit and to scoot forward before standing. 1x from bed and 1x from toilet  Ambulation/Gait Ambulation/Gait assistance: Supervision Ambulation Distance (Feet): 110 Feet Assistive device: Rolling walker (2 wheeled) Gait Pattern/deviations:  Step-through pattern;Decreased step length - right;Decreased step length - left;Decreased stride length;Trunk flexed;Narrow base of support Gait velocity: decreased Gait velocity interpretation: Below normal speed for age/gender General Gait Details: slow and cautious; supervision for safety; VCs for posture and pacing. 2 brief standing breaks secondary to fatigue   Stairs            Wheelchair Mobility    Modified Rankin (Stroke Patients Only)       Balance Overall balance assessment: Needs assistance Sitting-balance support: Feet supported Sitting balance-Leahy Scale: Good     Standing balance support: During functional activity;Bilateral upper extremity supported;Single extremity supported Standing balance-Leahy Scale: Fair Standing balance comment: able to maintain static standing at sink for hand washing                            Cognition Arousal/Alertness: Awake/alert Behavior During Therapy: WFL for tasks assessed/performed Overall Cognitive Status: Within Functional Limits for tasks assessed                         Following Commands: Follows one step commands consistently       General Comments: cognition not formally assessed during this session; [facility]      Exercises      General Comments        Pertinent Vitals/Pain Pain Assessment: Faces Pain Score: 8  Faces Pain Scale: Hurts a little bit Pain Location: back and shoulders  Pain Descriptors / Indicators: Sore Pain Intervention(s): Monitored during session    Home Living  Prior Function            PT Goals (current goals can now be found in the care plan section) Acute Rehab PT Goals Patient Stated Goal: none stated this session PT Goal Formulation: With patient Time For Goal Achievement: 07/05/17 Potential to Achieve Goals: Good Progress towards PT goals: Progressing toward goals    Frequency    Min 5X/week      PT Plan  Current plan remains appropriate    Co-evaluation              AM-PAC PT "6 Clicks" Daily Activity  Outcome Measure  Difficulty turning over in bed (including adjusting bedclothes, sheets and blankets)?: A Lot Difficulty moving from lying on back to sitting on the side of the bed? : A Lot Difficulty sitting down on and standing up from a chair with arms (e.g., wheelchair, bedside commode, etc,.)?: A Lot Help needed moving to and from a bed to chair (including a wheelchair)?: A Little Help needed walking in hospital room?: A Little Help needed climbing 3-5 steps with a railing? : A Lot 6 Click Score: 14    End of Session Equipment Utilized During Treatment: Gait belt Activity Tolerance: Patient tolerated treatment well Patient left: in bed;with call bell/phone within reach Nurse Communication: Mobility status PT Visit Diagnosis: Other abnormalities of gait and mobility (R26.89);Pain Pain - part of body:  (back and shoulders)     Time: 3536-1443 PT Time Calculation (min) (ACUTE ONLY): 18 min  Charges:  $Gait Training: 8-22 mins                    G CodesSindy Guadeloupe, SPT [phone] office    Margarita Grizzle 06/23/2017, 9:59 AM

## 2017-06-23 NOTE — Progress Notes (Signed)
Neurosurgery Progress Note  No issues overnight.  Doing well this am Pain adequately controlled  Tolerating po  EXAM:  BP (!) 112/53 (BP Location: Left Arm) Comment: map69  Pulse 76   Temp 98 F (36.7 C) (Oral)   Resp 16   Ht 5\' 8"  (1.727 m)   Wt 95.2 kg (209 lb 12.8 oz)   SpO2 99%   BMI 31.90 kg/m   Awake, alert, oriented  Speech fluent, appropriate  MAEW Incision c/d/i  PLAN Progressing nicely Continue with work with PT/OT Dispo planning

## 2017-06-24 DIAGNOSIS — S3992XA Unspecified injury of lower back, initial encounter: Secondary | ICD-10-CM | POA: Diagnosis not present

## 2017-06-24 DIAGNOSIS — I739 Peripheral vascular disease, unspecified: Secondary | ICD-10-CM | POA: Diagnosis not present

## 2017-06-24 DIAGNOSIS — N3941 Urge incontinence: Secondary | ICD-10-CM | POA: Diagnosis not present

## 2017-06-24 DIAGNOSIS — G8929 Other chronic pain: Secondary | ICD-10-CM | POA: Diagnosis not present

## 2017-06-24 DIAGNOSIS — N183 Chronic kidney disease, stage 3 (moderate): Secondary | ICD-10-CM | POA: Diagnosis not present

## 2017-06-24 DIAGNOSIS — M4804 Spinal stenosis, thoracic region: Secondary | ICD-10-CM | POA: Diagnosis not present

## 2017-06-24 DIAGNOSIS — Z720 Tobacco use: Secondary | ICD-10-CM | POA: Diagnosis not present

## 2017-06-24 DIAGNOSIS — M549 Dorsalgia, unspecified: Secondary | ICD-10-CM | POA: Diagnosis not present

## 2017-06-24 DIAGNOSIS — E785 Hyperlipidemia, unspecified: Secondary | ICD-10-CM | POA: Diagnosis not present

## 2017-06-24 DIAGNOSIS — Z4789 Encounter for other orthopedic aftercare: Secondary | ICD-10-CM | POA: Diagnosis not present

## 2017-06-24 DIAGNOSIS — F329 Major depressive disorder, single episode, unspecified: Secondary | ICD-10-CM | POA: Diagnosis not present

## 2017-06-24 DIAGNOSIS — R2681 Unsteadiness on feet: Secondary | ICD-10-CM | POA: Diagnosis not present

## 2017-06-24 DIAGNOSIS — M6281 Muscle weakness (generalized): Secondary | ICD-10-CM | POA: Diagnosis not present

## 2017-06-24 DIAGNOSIS — M25512 Pain in left shoulder: Secondary | ICD-10-CM | POA: Diagnosis not present

## 2017-06-24 DIAGNOSIS — M25511 Pain in right shoulder: Secondary | ICD-10-CM | POA: Diagnosis not present

## 2017-06-24 DIAGNOSIS — Z7984 Long term (current) use of oral hypoglycemic drugs: Secondary | ICD-10-CM | POA: Diagnosis not present

## 2017-06-24 DIAGNOSIS — E1122 Type 2 diabetes mellitus with diabetic chronic kidney disease: Secondary | ICD-10-CM | POA: Diagnosis not present

## 2017-06-24 DIAGNOSIS — K219 Gastro-esophageal reflux disease without esophagitis: Secondary | ICD-10-CM | POA: Diagnosis not present

## 2017-06-24 DIAGNOSIS — J302 Other seasonal allergic rhinitis: Secondary | ICD-10-CM | POA: Diagnosis not present

## 2017-06-24 DIAGNOSIS — M48062 Spinal stenosis, lumbar region with neurogenic claudication: Secondary | ICD-10-CM | POA: Diagnosis not present

## 2017-06-24 DIAGNOSIS — G894 Chronic pain syndrome: Secondary | ICD-10-CM | POA: Diagnosis not present

## 2017-06-24 DIAGNOSIS — E119 Type 2 diabetes mellitus without complications: Secondary | ICD-10-CM | POA: Diagnosis not present

## 2017-06-24 DIAGNOSIS — R69 Illness, unspecified: Secondary | ICD-10-CM | POA: Diagnosis not present

## 2017-06-24 DIAGNOSIS — Z9889 Other specified postprocedural states: Secondary | ICD-10-CM | POA: Diagnosis not present

## 2017-06-24 LAB — GLUCOSE, CAPILLARY: Glucose-Capillary: 132 mg/dL — ABNORMAL HIGH (ref 65–99)

## 2017-06-24 MED ORDER — INSULIN ASPART 100 UNIT/ML ~~LOC~~ SOLN: 0.0000 [IU] | Freq: Three times a day (TID) | SUBCUTANEOUS | Status: DC

## 2017-06-24 NOTE — Care Management Note (Signed)
Case Management Note  Patient Details  Name: [name] MRN: [id number] Date of Birth: [birth year]  Subjective/Objective:                    Action/Plan: Pt discharging to [facility] today. No further needs per CM.   Expected Discharge Date:  06/24/17               Expected Discharge Plan:  Skilled Nursing Facility  In-House Referral:  Clinical Social Work  Discharge planning Services  CM Consult  Post Acute Care Choice:  Durable Medical Equipment Choice offered to:     DME Arranged:    DME Agency:     HH Arranged:    [place] Agency:     Status of Service:  Completed, signed off  If discussed at [employer], dates discussed:    Additional Comments:  Pollie Friar, RN 06/24/2017, 3:32 PM

## 2017-06-24 NOTE — Progress Notes (Signed)
Physical Therapy Treatment Patient Details Name: Jocelyn Curtis MRN: [id number] DOB: 01/05/1946 Today's Date: 06/24/2017    History of Present Illness Pt is a 71 y/o female s/p T12-L3 decompressive laminectomies. PMH including but not limited to CVD, CAD, CHF, HTN, DM and COPD.    PT Comments    Patient needs continued reinforcement of back precautions. Also continues to require repetitive multimodal cueing for upright posture and pacing during ambulation. Unable to tolerate static standing without BUE support of RW. Will continue to treat acutely and progress as tolerated.   Follow Up Recommendations  SNF;Supervision/Assistance - 24 hour     Equipment Recommendations  None recommended by PT    Recommendations for Other Services       Precautions / Restrictions Precautions Precautions: Back;Fall Precaution Comments: Reviewed back precautions with patient Required Braces or Orthoses:  ("no brace need" - MD orders) Restrictions Weight Bearing Restrictions: No    Mobility  Bed Mobility               General bed mobility comments: Pt sitting EOB finishing lunch upon arrival  Transfers Overall transfer level: Needs assistance Equipment used: Rolling walker (2 wheeled) Transfers: Sit to/from Stand Sit to Stand: Min guard         General transfer comment: increased time and effort to stand; min guard for safety  Ambulation/Gait Ambulation/Gait assistance: Supervision Ambulation Distance (Feet): 50 Feet (3 bouts of 50' with standing rest breaks) Assistive device: Rolling walker (2 wheeled) Gait Pattern/deviations: Step-through pattern;Decreased step length - right;Decreased step length - left;Decreased dorsiflexion - right;Trunk flexed;Narrow base of support;Drifts right/left Gait velocity: decreased Gait velocity interpretation: Below normal speed for age/gender General Gait Details: slow and cautious gait; requiring repetitive multimodal cues for posture and  pacing; no significant LOB or need for physical assistance   Stairs            Wheelchair Mobility    Modified Rankin (Stroke Patients Only)       Balance Overall balance assessment: Needs assistance Sitting-balance support: Feet supported;No upper extremity supported Sitting balance-Leahy Scale: Good     Standing balance support: During functional activity;Bilateral upper extremity supported;No upper extremity supported Standing balance-Leahy Scale: Fair Standing balance comment: one brief instance of static standing without UE support; unable to maintain and quickly returned UEs to RW for support                            Cognition Arousal/Alertness: Awake/alert Behavior During Therapy: [facility] for tasks assessed/performed Overall Cognitive Status: Within Functional Limits for tasks assessed Area of Impairment: Memory;Problem solving                     Memory: Decreased recall of precautions       Problem Solving: Requires verbal cues;Requires tactile cues General Comments: repetitive VCs and TCs for proper posture during ambulation      Exercises      General Comments        Pertinent Vitals/Pain Pain Assessment: Faces Pain Score: 8  Faces Pain Scale: Hurts a little bit Pain Location: back and shoulders  Pain Descriptors / Indicators: Sore Pain Intervention(s): Monitored during session    Home Living                      Prior Function            PT Goals (current goals can now be found  in the care plan section) Acute Rehab PT Goals Patient Stated Goal: none stated this session PT Goal Formulation: With patient Time For Goal Achievement: 07/05/17 Potential to Achieve Goals: Good Progress towards PT goals: Progressing toward goals    Frequency    Min 5X/week      PT Plan Current plan remains appropriate    Co-evaluation              AM-PAC PT "6 Clicks" Daily Activity  Outcome Measure  Difficulty  turning over in bed (including adjusting bedclothes, sheets and blankets)?: A Lot Difficulty moving from lying on back to sitting on the side of the bed? : A Lot Difficulty sitting down on and standing up from a chair with arms (e.g., wheelchair, bedside commode, etc,.)?: A Little Help needed moving to and from a bed to chair (including a wheelchair)?: A Little Help needed walking in hospital room?: A Little Help needed climbing 3-5 steps with a railing? : A Lot 6 Click Score: 15    End of Session Equipment Utilized During Treatment: Gait belt Activity Tolerance: Patient tolerated treatment well Patient left: in chair;with call bell/phone within reach;with chair alarm set Nurse Communication: Mobility status PT Visit Diagnosis: Other abnormalities of gait and mobility (R26.89);Pain Pain - part of body:  (back and shoulders)     Time: 5974-1638 PT Time Calculation (min) (ACUTE ONLY): 17 min  Charges:  $Gait Training: 8-22 mins                    G CodesSindy Guadeloupe, SPT [phone] office    Margarita Grizzle 06/24/2017, 1:29 PM

## 2017-06-24 NOTE — Discharge Summary (Signed)
Physician Discharge Summary  Patient ID: Jocelyn Curtis MRN: [id number] DOB/AGE: [birth year] 71 y.o.  Admit date: 06/20/2017 Discharge date: 06/24/2017  Admission Diagnoses: Spinal stenosis T12-L1, L1-L2, L2-L3    Discharge Diagnoses: same as admitting   Discharged Condition: good  Hospital Course: The patient was admitted on 06/20/2017 and taken to the operating room where the patient underwent Decompressive laminectomy, medial facetectomy and foraminotomy T12-L1, L1 - 2, L2-L3. The patient tolerated the procedure well and was taken to the recovery room and then to the floor in stable condition. The hospital course was routine. There were no complications. The wound remained clean dry and intact. Pt had appropriate back soreness. No complaints of new pain or new N/T/W. The patient remained afebrile with stable vital signs, and tolerated a regular diet. The patient continued to increase activities, and pain was well controlled with oral pain medications.   Consults: None  Significant Diagnostic Studies:  Results for orders placed or performed during the hospital encounter of 06/20/17  Glucose, capillary  Result Value Ref Range   Glucose-Capillary 158 (H) 65 - 99 mg/dL   Comment 1 Notify RN    Comment 2 Document in Chart   Glucose, capillary  Result Value Ref Range   Glucose-Capillary 161 (H) 65 - 99 mg/dL   Comment 1 Notify RN   Glucose, capillary  Result Value Ref Range   Glucose-Capillary 140 (H) 65 - 99 mg/dL   Comment 1 Notify RN    Comment 2 Document in Chart   Glucose, capillary  Result Value Ref Range   Glucose-Capillary 132 (H) 65 - 99 mg/dL    Chest 2 View  Result Date: 06/18/2017 CLINICAL DATA:  Preop evaluation for upcoming lumbar surgery, initial encounter EXAM: CHEST  2 VIEW COMPARISON:  08/18/2014 FINDINGS: Cardiac shadow is within normal limits. The lungs are well aerated bilaterally. Mild scoliosis concave to the left is noted in the upper thoracic spine. Some  accentuation of the mediastinal markings is noted related to patient rotation and scoliosis. These are stable in appearance from prior exam. No acute abnormality is noted. IMPRESSION: No active cardiopulmonary disease. Electronically Signed   By: Inez Catalina M.D.   On: 06/18/2017 13:22   Dg Lumbar Spine 2-3 Views  Result Date: 06/20/2017 CLINICAL DATA:  Elective surgery EXAM: LUMBAR SPINE - 2-3 VIEW COMPARISON:  MRI 05/21/2017 FINDINGS: First cross-table lateral intraoperative image demonstrates a posterior needle directed at the L3 vertebral body. Second lateral intraoperative image demonstrates posterior surgical instruments directed at the L1-2 level. Diffuse degenerative disc disease and facet disease. IMPRESSION: Intraoperative localization as above. Electronically Signed   By: Rolm Baptise M.D.   On: 06/20/2017 15:25   Ir Ivc Filter Plmt / S&i /img Guid/mod Sed  Result Date: 06/17/2017 CLINICAL DATA:  History of postop DVT. Plans for multilevel lumbar fusion surgery. Prophylactic caval filtration is requested. EXAM: INFERIOR VENACAVOGRAM IVC FILTER PLACEMENT UNDER FLUOROSCOPY FLUOROSCOPY TIME:  0.6 minutes, 119 uGym2 DAP TECHNIQUE: Renal vein inflow is an caval anatomy reviewed on patient's prior CT abdomen 10/21/2013. Patency of the right IJ vein was confirmed with ultrasound with image documentation. An appropriate skin site was determined. Skin site was marked, prepped with chlorhexidine, and draped using maximum barrier technique. The region was infiltrated locally with 1% lidocaine. Intravenous Fentanyl and Versed were administered as conscious sedation during continuous monitoring of the patient's level of consciousness and physiological / cardiorespiratory status by the radiology RN, with a total moderate sedation time of 8 minutes.  Under real-time ultrasound guidance, the right IJ vein was accessed with a 21 gauge micropuncture needle; the needle tip within the vein was confirmed with  ultrasound image documentation. The needle was exchanged over a 018 guidewire for a transitional dilator, which allow advancement of the [facility] wire into the IVC. A long 6 French vascular sheath was placed for inferior venacavography. This demonstrated no caval thrombus. Renal vein inflows were evident. The [facility] IVC filter was advanced through the sheath and successfully deployed under fluoroscopy at the L2-3 level. Followup cavagram demonstrates stable filter position and no evident complication. The sheath was removed and hemostasis achieved at the site. No immediate complication. IMPRESSION: 1. Normal IVC. No thrombus or significant anatomic variation. 2. Technically successful infrarenal IVC filter placement. This is a retrievable model. PLAN: This IVC filter is potentially retrievable. The patient will be assessed for filter retrieval by Interventional Radiology in approximately 8-12 weeks. Further recommendations regarding filter retrieval, continued surveillance or declaration of device permanence, will be made at that time. Electronically Signed   By: Lucrezia Europe M.D.   On: 06/17/2017 10:02   Ir US Guide Vasc Access Right  Result Date: 06/17/2017 CLINICAL DATA:  History of postop DVT. Plans for multilevel lumbar fusion surgery. Prophylactic caval filtration is requested. EXAM: INFERIOR VENACAVOGRAM IVC FILTER PLACEMENT UNDER FLUOROSCOPY FLUOROSCOPY TIME:  0.6 minutes, 119 uGym2 DAP TECHNIQUE: Renal vein inflow is an caval anatomy reviewed on patient's prior CT abdomen 10/21/2013. Patency of the right IJ vein was confirmed with ultrasound with image documentation. An appropriate skin site was determined. Skin site was marked, prepped with chlorhexidine, and draped using maximum barrier technique. The region was infiltrated locally with 1% lidocaine. Intravenous Fentanyl and Versed were administered as conscious sedation during continuous monitoring of the patient's level of consciousness and  physiological / cardiorespiratory status by the radiology RN, with a total moderate sedation time of 8 minutes. Under real-time ultrasound guidance, the right IJ vein was accessed with a 21 gauge micropuncture needle; the needle tip within the vein was confirmed with ultrasound image documentation. The needle was exchanged over a 018 guidewire for a transitional dilator, which allow advancement of the [facility] wire into the IVC. A long 6 French vascular sheath was placed for inferior venacavography. This demonstrated no caval thrombus. Renal vein inflows were evident. The [facility] IVC filter was advanced through the sheath and successfully deployed under fluoroscopy at the L2-3 level. Followup cavagram demonstrates stable filter position and no evident complication. The sheath was removed and hemostasis achieved at the site. No immediate complication. IMPRESSION: 1. Normal IVC. No thrombus or significant anatomic variation. 2. Technically successful infrarenal IVC filter placement. This is a retrievable model. PLAN: This IVC filter is potentially retrievable. The patient will be assessed for filter retrieval by Interventional Radiology in approximately 8-12 weeks. Further recommendations regarding filter retrieval, continued surveillance or declaration of device permanence, will be made at that time. Electronically Signed   By: Lucrezia Europe M.D.   On: 06/17/2017 10:02    Antibiotics:  Anti-infectives    Start     Dose/Rate Route Frequency Ordered Stop   06/21/17 1000  vancomycin (VANCOCIN) 1,500 mg in sodium chloride 0.9 % 500 mL IVPB  Status:  Discontinued     1,500 mg 250 mL/hr over 120 Minutes Intravenous Every 24 hours 06/20/17 1934 06/21/17 0916   06/20/17 1217  50,000 units bacitracin in 0.9% normal saline 250 mL irrigation  Status:  Discontinued       As  needed 06/20/17 1217 06/20/17 1335   06/20/17 1030  vancomycin (VANCOCIN) IVPB 1000 mg/200 mL premix     1,000 mg 200 mL/hr over 60 Minutes  Intravenous To [facility] 06/19/17 0846 06/20/17 1155      Discharge Exam: Blood pressure (!) 107/46, pulse 75, temperature 98.1 F (36.7 C), temperature source Oral, resp. rate 16, height 5\' 8"  (1.727 m), weight 95.2 kg (209 lb 12.8 oz), SpO2 99 %. Neurologic: Grossly normal   Discharge Medications:   Allergies as of 06/24/2017      Reactions   Lisinopril Swelling, Rash, Anaphylaxis   Metformin And Related Anaphylaxis   Nsaids Other (See Comments)   GI BLEED   Penicillins Itching, Swelling, Rash   Abdominal swelling   Codeine Itching, Nausea And Vomiting      Medication List    STOP taking these medications   oxyCODONE-acetaminophen 10-325 MG tablet Commonly known as:  PERCOCET Replaced by:  oxyCODONE-acetaminophen 5-325 MG tablet     TAKE these medications   aspirin 81 MG chewable tablet Chew 81 mg by mouth daily.   atorvastatin 80 MG tablet Commonly known as:  LIPITOR Take 80 mg by mouth every morning.   glimepiride 4 MG tablet Commonly known as:  AMARYL Take 8 mg by mouth daily with breakfast. Take 2 tablets every morning.   methocarbamol 500 MG tablet Commonly known as:  ROBAXIN Take 1 tablet (500 mg total) by mouth every 6 (six) hours as needed for muscle spasms.   oxyCODONE-acetaminophen 5-325 MG tablet Commonly known as:  PERCOCET/ROXICET Take 1 tablet by mouth every 4 (four) hours as needed for moderate pain. Replaces:  oxyCODONE-acetaminophen 10-325 MG tablet   pantoprazole 40 MG tablet Commonly known as:  PROTONIX Take 40 mg by mouth daily.   PARoxetine 30 MG tablet Commonly known as:  PAXIL Take 30 mg by mouth every morning.   traZODone 150 MG tablet Commonly known as:  DESYREL Take 150 mg by mouth at bedtime.            Discharge Care Instructions        Start     Ordered   06/24/17 0000  Increase activity slowly     06/24/17 0857   06/24/17 0000  Diet - low sodium heart healthy     06/24/17 0857   06/24/17 0000   Remove  dressing in 72 hours     06/24/17 0857   06/24/17 0000  Call MD for:  extreme fatigue     06/24/17 0857   06/24/17 0000  Call MD for:  persistant dizziness or light-headedness     06/24/17 0857   06/24/17 0000  Call MD for:  hives     06/24/17 0857   06/24/17 0000  Call MD for:  difficulty breathing, headache or visual disturbances     06/24/17 0857   06/24/17 0000  Call MD for:  redness, tenderness, or signs of infection (pain, swelling, redness, odor or green/yellow discharge around incision site)     06/24/17 0857   06/24/17 0000  Call MD for:  severe uncontrolled pain     06/24/17 0857   06/24/17 0000  Call MD for:  persistant nausea and vomiting     06/24/17 0857   06/24/17 0000  Call MD for:  temperature >100.4     06/24/17 0857   06/22/17 0000  methocarbamol (ROBAXIN) 500 MG tablet  Every 6 hours PRN     06/22/17 1035   06/22/17 0000  oxyCODONE-acetaminophen (PERCOCET/ROXICET) 5-325 MG tablet  Every 4 hours PRN     06/22/17 1035   06/22/17 0000  Increase activity slowly     06/22/17 1035   06/22/17 0000  Driving Restrictions    Comments:  Do not drive until given clearance.   06/22/17 1035   06/22/17 0000  Lifting restrictions    Comments:  Do not lift anything >10lbs. Avoid bending and twisting in awkward positions. Avoid bending at the back.   06/22/17 1035   06/22/17 0000  Diet general     06/22/17 1035   06/22/17 0000  Remove dressing in 24 hours     06/22/17 1035   06/22/17 0000  Call MD for:  temperature >100.4     06/22/17 1035   06/22/17 0000  Call MD for:  severe uncontrolled pain     06/22/17 1035   06/22/17 0000  Call MD for:  redness, tenderness, or signs of infection (pain, swelling, redness, odor or green/yellow discharge around incision site)     06/22/17 1035   06/22/17 0000  Call MD for:  difficulty breathing, headache or visual disturbances     06/22/17 1035   06/22/17 0000  Call MD for:  persistant dizziness or light-headedness     06/22/17 1035       Disposition: snf   Final Dx: same as admitting  Discharge Instructions     Remove dressing in 72 hours    Complete by:  As directed    Call MD for:  difficulty breathing, headache or visual disturbances    Complete by:  As directed    Call MD for:  difficulty breathing, headache or visual disturbances    Complete by:  As directed    Call MD for:  extreme fatigue    Complete by:  As directed    Call MD for:  hives    Complete by:  As directed    Call MD for:  persistant dizziness or light-headedness    Complete by:  As directed    Call MD for:  persistant dizziness or light-headedness    Complete by:  As directed    Call MD for:  persistant nausea and vomiting    Complete by:  As directed    Call MD for:  redness, tenderness, or signs of infection (pain, swelling, redness, odor or green/yellow discharge around incision site)    Complete by:  As directed    Call MD for:  redness, tenderness, or signs of infection (pain, swelling, redness, odor or green/yellow discharge around incision site)    Complete by:  As directed    Call MD for:  severe uncontrolled pain    Complete by:  As directed    Call MD for:  severe uncontrolled pain    Complete by:  As directed    Call MD for:  temperature >100.4    Complete by:  As directed    Call MD for:  temperature >100.4    Complete by:  As directed    Diet - low sodium heart healthy    Complete by:  As directed    Diet general    Complete by:  As directed    Driving Restrictions    Complete by:  As directed    Do not drive until given clearance.   Increase activity slowly    Complete by:  As directed    Increase activity slowly    Complete by:  As directed  Lifting restrictions    Complete by:  As directed    Do not lift anything >10lbs. Avoid bending and twisting in awkward positions. Avoid bending at the back.   Remove dressing in 24 hours    Complete by:  As directed        Contact information for follow-up providers     Eustace Moore, MD Follow up.   Specialty:  Neurosurgery Contact information: [address]  [address] [phone]            Contact information for after-discharge care    [facility] .   Specialty:  [place] information: [address] [place] [phone]                   Signed: Ocie Cornfield [facility] 06/24/2017, 8:57 AM

## 2017-06-24 NOTE — Clinical Social Work Placement (Addendum)
Nurse to call report to [phone], Room 127B   CLINICAL SOCIAL WORK PLACEMENT  NOTE  Date:  06/24/2017  Patient Details  Name: Jocelyn Curtis MRN: [id number] Date of Birth: 1945/10/24  Clinical Social Work is seeking post-discharge placement for this patient at the [place] level of care (*CSW will initial, date and re-position this form in  chart as items are completed):  Yes   Patient/family provided with [place] Work Department's list of facilities offering this level of care within the geographic area requested by the patient (or if unable, by the patient's family).  Yes   Patient/family informed of their freedom to choose among providers that offer the needed level of care, that participate in Medicare, Medicaid or managed care program needed by the patient, have an available bed and are willing to accept the patient.  Yes   Patient/family informed of [facility]'s ownership interest in [facility] and [facility], as well as of the fact that they are under no obligation to receive care at these facilities.  PASRR submitted to EDS on       PASRR number received on 06/23/17     Existing PASRR number confirmed on       FL2 transmitted to all facilities in geographic area requested by pt/family on 06/21/17     FL2 transmitted to all facilities within larger geographic area on 06/21/17     Patient informed that his/her managed care company has contracts with or will negotiate with certain facilities, including the following:        Yes   Patient/family informed of bed offers received.  Patient chooses bed at [place]     Physician recommends and patient chooses bed at      Patient to be transferred to [facility] on 06/24/17.  Patient to be transferred to facility by [facility]     Patient family notified on   of transfer.  Name of family member notified:        PHYSICIAN        Additional Comment:    _______________________________________________ Geralynn Ochs, LCSW 06/24/2017, 12:30 PM

## 2017-06-24 NOTE — Progress Notes (Signed)
CSW met with patient to discuss discharge and set up transition to SNF. CSW explained that [facility] was running [place] with [facility], but would accept patient under 5 day LOG pending authorization with insurance; patient indicated understanding. Patient discussed that she had just called her daughter who will be coming to the hospital and bringing her some clothes to take with her; patient requested that CSW wait to set up transport after the daughter arrives.  CSW to follow up to facilitate discharge to SNF.  Laveda Abbe, [place] Clinical Social Worker [phone]

## 2017-06-24 NOTE — Progress Notes (Signed)
[facility] arrived to transport patient Report attempted given to accepting nurse [phone]) Patient requested and received pain meds per order.

## 2017-06-25 ENCOUNTER — Encounter: Payer: Self-pay | Admitting: Adult Health

## 2017-06-25 ENCOUNTER — Non-Acute Institutional Stay (SKILLED_NURSING_FACILITY): Payer: Medicare HMO | Admitting: Adult Health

## 2017-06-25 DIAGNOSIS — E785 Hyperlipidemia, unspecified: Secondary | ICD-10-CM

## 2017-06-25 DIAGNOSIS — E119 Type 2 diabetes mellitus without complications: Secondary | ICD-10-CM | POA: Diagnosis not present

## 2017-06-25 DIAGNOSIS — M48062 Spinal stenosis, lumbar region with neurogenic claudication: Secondary | ICD-10-CM | POA: Diagnosis not present

## 2017-06-25 DIAGNOSIS — F329 Major depressive disorder, single episode, unspecified: Secondary | ICD-10-CM

## 2017-06-25 DIAGNOSIS — F32A Depression, unspecified: Secondary | ICD-10-CM | POA: Insufficient documentation

## 2017-06-25 DIAGNOSIS — K219 Gastro-esophageal reflux disease without esophagitis: Secondary | ICD-10-CM

## 2017-06-25 DIAGNOSIS — M48061 Spinal stenosis, lumbar region without neurogenic claudication: Secondary | ICD-10-CM | POA: Insufficient documentation

## 2017-06-25 DIAGNOSIS — R69 Illness, unspecified: Secondary | ICD-10-CM | POA: Diagnosis not present

## 2017-06-25 NOTE — Progress Notes (Signed)
Location:   [facility] Room Number: [place] of Service:  SNF (31)   CODE STATUS: Full Code  Allergies  Allergen Reactions  . Lisinopril Swelling, Rash and Anaphylaxis  . Metformin And Related Anaphylaxis  . Nsaids Other (See Comments)    GI BLEED   . Penicillins Itching, Swelling and Rash    Abdominal swelling  . Codeine Itching and Nausea And Vomiting    Chief Complaint  Patient presents with  . Hospitalization Follow-up    Hospital follow up    HPI:  She is a 71 year old who has been hospitalized from 06-20-17 through 06-24-17 for a T12-L1; L1-L2; L2-L3 decompressive laminectomy. She has a history of post op dvt; had an ivc filter placed on 06-17-17 for prophylactics.  She had an uneventful hospitalization. She is not complaining of back pain; does have neuropathic pain in both lower legs. She denies any constipation. There are no nursing concerns at this time.     Past Medical History:  Diagnosis Date  . AAA (abdominal aortic aneurysm) ([place])   . AAA (abdominal aortic aneurysm) ([place])    followed by dr Jeralene Peters for appt  . Abdominal aortic aneurysm without rupture [facility])    followed by Dr. Concepcion Elk  . Anxiety and depression   . Arthritis    "all over body"  . Blood transfusion 14yrs ago  . Bruises easily   . Cancer ([place])    HX SKIN CA  . Carotid artery occlusion   . Cerebrovascular disease    pt states she pulls to the left and staggers alot  . CHF (congestive heart failure) ([place])    USED TO SEE DR GROVE- not seen cardiac dr out of hospital since 2013  . Chronic back pain   . Complication of anesthesia    ENDED WITH BLOOD CLOTT IN LEG- "takes more anesthesia for me"  . COPD (chronic obstructive pulmonary disease) ([place])   . Coronary artery disease   . Depression    takes Paxil daily  . Diabetes mellitus    type II  . Dizziness   . Dizziness - giddy   . Dysrhythmia    -atrial fib hx no problems now  . Foot drop    right since knee  replacement  . GERD (gastroesophageal reflux disease)    takes Protonix daily  . H/O blood clots    in right foot-04/24/11  . H/O: GI bleed   . History of bladder infections   . Hyperlipidemia    takes Pravastatin daily  . Hypertension    takes  Lisinopril daily, Dr. Ricci Barker. took care of pt. before his retirement   . Osteoarthritis   . Osteoarthritis (arthritis due to wear and tear of joints)   . Peripheral vascular disease ([place])    blood clot in foot following epidural anesthesia - 04/2011  . Pneumonia    hx of;last time about 51yrs ago 2008  . Renal insufficiency    SEE MED DR Welton Flakes [place]  . Shortness of breath    with exertion  . Stroke [facility])    ministrokes many yrs. ago   . Subclavian steal syndrome   . Subclavian steal syndrome   . Vision loss    left eye    Past Surgical History:  Procedure Laterality Date  . APPENDECTOMY    . CARDIAC CATHETERIZATION    . CAROTID-SUBCLAVIAN BYPASS GRAFT  05/02/2012   Procedure: BYPASS GRAFT CAROTID-SUBCLAVIAN;  Surgeon: Mal Misty,  MD;  Location: [place];  Service: Vascular;  Laterality: Left;  . CARPAL TUNNEL RELEASE Left   . COSMETIC SURGERY     face lift  . ENDARTERECTOMY  05/02/2012   Procedure: ENDARTERECTOMY SUBCLAVIAN;  Surgeon: Mal Misty, MD;  Location: [place];  Service: Vascular;  Laterality: Left;  . EYE SURGERY Bilateral    cataracts  . FOOT SURGERY Left    left-hammer toes  . HAND SURGERY     right  joints  . IR IVC FILTER PLMT / S&I /IMG GUID/MOD SED  06/17/2017  . IR US GUIDE VASC ACCESS RIGHT  06/17/2017  . JOINT REPLACEMENT     bilateral total knee replacement  . LUMBAR LAMINECTOMY/DECOMPRESSION MICRODISCECTOMY N/A 06/20/2017   Procedure: Thoracic 12 Lumbar 1, Lumabr Two-Lumbar Three LAMINECTOMY/FORAMINOTOMY;  Surgeon: Eustace Moore, MD;  Location: [place];  Service: Neurosurgery;  Laterality: N/A;  Thoracic 12 Lumbar 1, Lumabr Two-Lumbar Three LAMINECTOMY/FORAMINOTOMY   . NASAL SINUS SURGERY    .  perineal abscess I & D     necrotizing soft tissue infection 2005  . ROTATOR CUFF REPAIR     bilateral  . VAGINAL HYSTERECTOMY      Social History   Social History  . Marital status: Widowed    Spouse name: N/A  . Number of children: N/A  . Years of education: N/A   Occupational History  . Not on file.   Social History Main Topics  . Smoking status: Current Some Day Smoker    Packs/day: 0.50    Years: 48.00    Types: Cigarettes  . Smokeless tobacco: Former Systems developer    Types: Snuff    Quit date: 06/18/2016     Comment: uses snuff every once in a while  . Alcohol use 0.0 oz/week     Comment: occ beer  . Drug use: No  . Sexual activity: Yes    Birth control/ protection: Surgical   Other Topics Concern  . Not on file   Social History Narrative  . No narrative on file   Family History  Problem Relation Age of Onset  . Diabetes Mother   . Heart disease Mother        before age 24  . Hyperlipidemia Mother   . Hypertension Mother   . Heart attack Mother   . Cancer Father   . Heart disease Father        before age 77  . Hyperlipidemia Father   . Hypertension Father   . Heart attack Father   . Deep vein thrombosis Sister   . Diabetes Sister   . Heart disease Sister   . Hyperlipidemia Sister   . Hypertension Sister   . Heart attack Sister   . Diabetes Brother   . Hypertension Brother   . Diabetes Daughter   . Anesthesia problems Neg Hx   . Hypotension Neg Hx   . Malignant hyperthermia Neg Hx   . Pseudochol deficiency Neg Hx       VITAL SIGNS BP 112/60   Pulse 75   Temp 97.8 F (36.6 C)   Resp 18   Ht 5\' 8"  (1.727 m)   Wt 208 lb 3.2 oz (94.4 kg)   SpO2 96%   BMI 31.66 kg/m   Patient's Medications  New Prescriptions   No medications on file  Previous Medications   ASPIRIN 81 MG TABLET    Chew 81 mg by mouth daily.   ATORVASTATIN (LIPITOR) 80 MG TABLET  Take 80 mg by mouth every morning.    GLIMEPIRIDE (AMARYL) 4 MG TABLET    Take 2 tablets by  mouth every morning.   METHOCARBAMOL (ROBAXIN) 500 MG TABLET    Take 1 tablet (500 mg total) by mouth every 6 (six) hours as needed for muscle spasms.   OXYCODONE-ACETAMINOPHEN (PERCOCET/ROXICET) 5-325 MG TABLET    Take 1 tablet by mouth every 4 (four) hours as needed for moderate pain.   PANTOPRAZOLE (PROTONIX) 40 MG TABLET    Take 40 mg by mouth daily.   PAROXETINE (PAXIL) 30 MG TABLET    Take 30 mg by mouth every morning.    TRAZODONE (DESYREL) 150 MG TABLET    Take 150 mg by mouth at bedtime.  Modified Medications   No medications on file  Discontinued Medications   No medications on file     SIGNIFICANT DIAGNOSTIC EXAMS  TODAY:   06-18-17: chest x-ray:No active cardiopulmonary disease.  06-20-17: lumbar x-ray: Intraoperative localization as above.  LABS REVIEWED: TODAY:   06-18-17: wbc 9.6; hgb 14.8; ht 45.8; mcv 98.7; plt 182; glucose 131; bun 22; create 1.38; k+ 4.5; na++ 137; ca 9.5; hgb a1c 6.9    Review of Systems  Constitutional: Negative for malaise/fatigue.  Respiratory: Negative for cough and shortness of breath.   Cardiovascular: Negative for chest pain, palpitations and leg swelling.  Gastrointestinal: Negative for abdominal pain, constipation and heartburn.  Musculoskeletal: Negative for back pain, joint pain and myalgias.  Skin: Negative.   Neurological: Negative for dizziness.  Psychiatric/Behavioral: The patient is not nervous/anxious.    Physical Exam  Constitutional: She is oriented to person, place, and time. She appears well-developed and well-nourished. No distress.  HENT:  Head: Normocephalic.  Eyes: Conjunctivae are normal.  Neck: Normal range of motion. Neck supple.  Cardiovascular: Normal rate, regular rhythm, normal heart sounds and intact distal pulses.   Pulmonary/Chest: Effort normal and breath sounds normal. No respiratory distress.  Abdominal: Soft. Bowel sounds are normal. She exhibits no distension.  Musculoskeletal: Normal range of  motion.  Lymphadenopathy:    She has no cervical adenopathy.  Neurological: She is alert and oriented to person, place, and time.  Skin: Skin is warm and dry. She is not diaphoretic.  Incision lines without signs of infection present   Psychiatric: She has a normal mood and affect.    ASSESSMENT/ PLAN:  TODAY:   1. Diabetes: hgb a1c 6.9; is stable will continue amaryl 16 mg daily   2. Dyslipidemia: is stable; will continue lipitor 80 mg daily   3. Gerd: stable will continue protonix 40 mg daily   4. Spinal stenosis T12-L3: is status post laminectomy: will continue therapy as directed to improve upon her independence with her adls. Will continue robaxin 500 mg every 6 hours as needed; and percocet 5/325 mg every 4 hours as needed and will continue to monitor her status.   5. Depression: is stable: will continue paxil 30 mg daily     MD is aware of resident's narcotic use and is in agreement with current plan of care. We will attempt to wean resident as apropriate   Ok Edwards NP [facility]  Contact [phone] Monday through Friday 8am- 5pm  After hours call [phone]

## 2017-06-25 NOTE — Progress Notes (Signed)
error 

## 2017-06-26 ENCOUNTER — Non-Acute Institutional Stay (SKILLED_NURSING_FACILITY): Payer: Medicare HMO | Admitting: Adult Health

## 2017-06-26 ENCOUNTER — Encounter: Payer: Self-pay | Admitting: Adult Health

## 2017-06-26 DIAGNOSIS — M48062 Spinal stenosis, lumbar region with neurogenic claudication: Secondary | ICD-10-CM

## 2017-06-26 DIAGNOSIS — Z9889 Other specified postprocedural states: Secondary | ICD-10-CM | POA: Diagnosis not present

## 2017-06-26 NOTE — Progress Notes (Signed)
Location:   [place] Room Number: [place] of Service:  SNF (31)   CODE STATUS: Full code  Allergies  Allergen Reactions  . Lisinopril Swelling, Rash and Anaphylaxis  . Metformin And Related Anaphylaxis  . Nsaids Other (See Comments)    GI BLEED   . Penicillins Itching, Swelling and Rash    Abdominal swelling  . Codeine Itching and Nausea And Vomiting    Chief Complaint  Patient presents with  . Acute Visit    72 hour care plan meeting    HPI:  She has been hospitalized for lumbar laminectomy. We have come together to discuss her discharge plans. She is wanting to go home. She will need to work on steps before she can go home. She anticipates going home in the near future. At this time we do not anticipate her needing any dme.  She says that her back is greatly improving; she is not complaining of insomnia; or changes in her appetite.    Past Medical History:  Diagnosis Date  . AAA (abdominal aortic aneurysm) ([place])   . AAA (abdominal aortic aneurysm) ([place])    followed by dr Jeralene Peters for appt  . Abdominal aortic aneurysm without rupture [facility])    followed by Dr. Concepcion Elk  . Anxiety and depression   . Arthritis    "all over body"  . Blood transfusion 1yrs ago  . Bruises easily   . Cancer ([place])    HX SKIN CA  . Carotid artery occlusion   . Cerebrovascular disease    pt states she pulls to the left and staggers alot  . CHF (congestive heart failure) ([place])    USED TO SEE DR GROVE- not seen cardiac dr out of hospital since 2013  . Chronic back pain   . Complication of anesthesia    ENDED WITH BLOOD CLOTT IN LEG- "takes more anesthesia for me"  . COPD (chronic obstructive pulmonary disease) ([place])   . Coronary artery disease   . Depression    takes Paxil daily  . Diabetes mellitus    type II  . Dizziness   . Dizziness - giddy   . Dysrhythmia    -atrial fib hx no problems now  . Foot drop    right since knee replacement  . GERD  (gastroesophageal reflux disease)    takes Protonix daily  . H/O blood clots    in right foot-04/24/11  . H/O: GI bleed   . History of bladder infections   . Hyperlipidemia    takes Pravastatin daily  . Hypertension    takes  Lisinopril daily, Dr. Ricci Barker. took care of pt. before his retirement   . Osteoarthritis   . Osteoarthritis (arthritis due to wear and tear of joints)   . Peripheral vascular disease ([place])    blood clot in foot following epidural anesthesia - 04/2011  . Pneumonia    hx of;last time about 19yrs ago 2008  . Renal insufficiency    SEE MED DR Wel[place] Flakes [place]  . Shortness of breath    with exertion  . Stroke [facility])    ministrokes many yrs. ago   . Subclavian steal syndrome   . Subclavian steal syndrome   . Vision loss    left eye    Past Surgical History:  Procedure Laterality Date  . APPENDECTOMY    . CARDIAC CATHETERIZATION    . CAROTID-SUBCLAVIAN BYPASS GRAFT  05/02/2012   Procedure: BYPASS GRAFT CAROTID-SUBCLAVIAN;  Surgeon:  Mal Misty, MD;  Location: [place];  Service: Vascular;  Laterality: Left;  . CARPAL TUNNEL RELEASE Left   . COSMETIC SURGERY     face lift  . ENDARTERECTOMY  05/02/2012   Procedure: ENDARTERECTOMY SUBCLAVIAN;  Surgeon: Mal Misty, MD;  Location: [place];  Service: Vascular;  Laterality: Left;  . EYE SURGERY Bilateral    cataracts  . FOOT SURGERY Left    left-hammer toes  . HAND SURGERY     right  joints  . IR IVC FILTER PLMT / S&I /IMG GUID/MOD SED  06/17/2017  . IR US GUIDE VASC ACCESS RIGHT  06/17/2017  . JOINT REPLACEMENT     bilateral total knee replacement  . LUMBAR LAMINECTOMY/DECOMPRESSION MICRODISCECTOMY N/A 06/20/2017   Procedure: Thoracic 12 Lumbar 1, Lumabr Two-Lumbar Three LAMINECTOMY/FORAMINOTOMY;  Surgeon: Eustace Moore, MD;  Location: [place];  Service: Neurosurgery;  Laterality: N/A;  Thoracic 12 Lumbar 1, Lumabr Two-Lumbar Three LAMINECTOMY/FORAMINOTOMY   . NASAL SINUS SURGERY    . perineal abscess I & D      necrotizing soft tissue infection 2005  . ROTATOR CUFF REPAIR     bilateral  . VAGINAL HYSTERECTOMY      Social History   Social History  . Marital status: Widowed    Spouse name: N/A  . Number of children: N/A  . Years of education: N/A   Occupational History  . Not on file.   Social History Main Topics  . Smoking status: Current Some Day Smoker    Packs/day: 0.50    Years: 48.00    Types: Cigarettes  . Smokeless tobacco: Former Systems developer    Types: Snuff    Quit date: 06/18/2016     Comment: uses snuff every once in a while  . Alcohol use 0.0 oz/week     Comment: occ beer  . Drug use: No  . Sexual activity: Yes    Birth control/ protection: Surgical   Other Topics Concern  . Not on file   Social History Narrative  . No narrative on file   Family History  Problem Relation Age of Onset  . Diabetes Mother   . Heart disease Mother        before age 61  . Hyperlipidemia Mother   . Hypertension Mother   . Heart attack Mother   . Cancer Father   . Heart disease Father        before age 60  . Hyperlipidemia Father   . Hypertension Father   . Heart attack Father   . Deep vein thrombosis Sister   . Diabetes Sister   . Heart disease Sister   . Hyperlipidemia Sister   . Hypertension Sister   . Heart attack Sister   . Diabetes Brother   . Hypertension Brother   . Diabetes Daughter   . Anesthesia problems Neg Hx   . Hypotension Neg Hx   . Malignant hyperthermia Neg Hx   . Pseudochol deficiency Neg Hx       VITAL SIGNS BP 112/60   Pulse 75   Temp 97.8 F (36.6 C)   Resp 18   Ht 5\' 8"  (1.727 m)   Wt 208 lb 3.2 oz (94.4 kg)   SpO2 96%   BMI 31.66 kg/m   Patient's Medications  New Prescriptions   No medications on file  Previous Medications   ASPIRIN 81 MG TABLET    Chew 81 mg by mouth daily.   ATORVASTATIN (LIPITOR) 80 MG  TABLET    Take 80 mg by mouth every morning.    GLIMEPIRIDE PO    Take 16 mg by mouth daily with breakfast.    METHOCARBAMOL  (ROBAXIN) 500 MG TABLET    Take 1 tablet (500 mg total) by mouth every 6 (six) hours as needed for muscle spasms.   OXYCODONE-ACETAMINOPHEN (PERCOCET/ROXICET) 5-325 MG TABLET    Take 1 tablet by mouth every 4 (four) hours as needed for moderate pain.   PANTOPRAZOLE (PROTONIX) 40 MG TABLET    Take 40 mg by mouth daily.   PAROXETINE (PAXIL) 30 MG TABLET    Take 30 mg by mouth every morning.    TRAZODONE (DESYREL) 150 MG TABLET    Take 150 mg by mouth at bedtime.  Modified Medications   No medications on file  Discontinued Medications   No medications on file     SIGNIFICANT DIAGNOSTIC EXAMS  PREVOIUS:   06-18-17: chest x-ray:No active cardiopulmonary disease.  06-20-17: lumbar x-ray: Intraoperative localization as above.  NO NEW EXAMS   LABS REVIEWED: PREVIOUS:   06-18-17: wbc 9.6; hgb 14.8; ht 45.8; mcv 98.7; plt 182; glucose 131; bun 22; create 1.38; k+ 4.5; na++ 137; ca 9.5; hgb a1c 6.9  NO NEW LABS     Review of Systems  Constitutional: Negative for malaise/fatigue.  Respiratory: Negative for cough and shortness of breath.   Cardiovascular: Negative for chest pain, palpitations and leg swelling.  Gastrointestinal: Negative for abdominal pain, constipation and heartburn.  Musculoskeletal: Negative for back pain, joint pain and myalgias.  Skin: Negative.   Neurological: Negative for dizziness.  Psychiatric/Behavioral: The patient is not nervous/anxious.    Physical Exam  Constitutional: She is oriented to person, place, and time. She appears well-developed and well-nourished. No distress.  Eyes: Conjunctivae are normal.  Neck: Normal range of motion. Neck supple. No thyromegaly present.  Cardiovascular: Normal rate, regular rhythm, normal heart sounds and intact distal pulses.   Pulmonary/Chest: Breath sounds normal. No respiratory distress.  Abdominal: Soft. Bowel sounds are normal. She exhibits no distension. There is no tenderness.  Musculoskeletal: Normal range of  motion. She exhibits no edema.  Lymphadenopathy:    She has no cervical adenopathy.  Neurological: She is alert and oriented to person, place, and time.  Skin: Skin is warm and dry. She is not diaphoretic.  Psychiatric: She has a normal mood and affect.     ASSESSMENT/ PLAN:  TODAY:   1. Spinal stenosis T12-L3: is status post laminectomy: will continue therapy as directed to improve upon her independence with her adls. Will continue robaxin 500 mg every 6 hours as needed; and percocet 5/325 mg every 4 hours as needed and will continue to monitor her status.   Time spent with patient and care plan team >30 minutes; did discuss her therapy; discharge anticipated time of discharged; home health needs and dme needs; did verbalize understanding  MD is aware of resident's narcotic use and is in agreement with current plan of care. We will attempt to wean resident as apropriate     Ok Edwards NP [facility]  Contact [phone] Monday through Friday 8am- 5pm  After hours call [phone]  .

## 2017-06-27 ENCOUNTER — Other Ambulatory Visit: Payer: Self-pay

## 2017-06-27 ENCOUNTER — Encounter: Payer: Self-pay | Admitting: Internal Medicine

## 2017-06-27 ENCOUNTER — Non-Acute Institutional Stay (SKILLED_NURSING_FACILITY): Payer: Medicare HMO | Admitting: Internal Medicine

## 2017-06-27 DIAGNOSIS — M25512 Pain in left shoulder: Secondary | ICD-10-CM

## 2017-06-27 DIAGNOSIS — M25511 Pain in right shoulder: Secondary | ICD-10-CM

## 2017-06-27 DIAGNOSIS — I739 Peripheral vascular disease, unspecified: Secondary | ICD-10-CM | POA: Diagnosis not present

## 2017-06-27 DIAGNOSIS — E1122 Type 2 diabetes mellitus with diabetic chronic kidney disease: Secondary | ICD-10-CM | POA: Diagnosis not present

## 2017-06-27 DIAGNOSIS — N183 Chronic kidney disease, stage 3 unspecified: Secondary | ICD-10-CM

## 2017-06-27 DIAGNOSIS — Z720 Tobacco use: Secondary | ICD-10-CM | POA: Diagnosis not present

## 2017-06-27 DIAGNOSIS — G894 Chronic pain syndrome: Secondary | ICD-10-CM | POA: Diagnosis not present

## 2017-06-27 DIAGNOSIS — G8929 Other chronic pain: Secondary | ICD-10-CM

## 2017-06-27 DIAGNOSIS — M48062 Spinal stenosis, lumbar region with neurogenic claudication: Secondary | ICD-10-CM

## 2017-06-27 DIAGNOSIS — N3941 Urge incontinence: Secondary | ICD-10-CM

## 2017-06-27 DIAGNOSIS — Z9889 Other specified postprocedural states: Secondary | ICD-10-CM

## 2017-06-27 DIAGNOSIS — J302 Other seasonal allergic rhinitis: Secondary | ICD-10-CM | POA: Diagnosis not present

## 2017-06-27 MED ORDER — OXYCODONE-ACETAMINOPHEN 5-325 MG PO TABS: 1.0000 | ORAL_TABLET | ORAL | 0 refills | Status: DC | PRN

## 2017-06-27 NOTE — Telephone Encounter (Signed)
RX faxed to [facility] @ [phone], phone number [phone] 

## 2017-06-27 NOTE — Progress Notes (Signed)
Error

## 2017-06-27 NOTE — Progress Notes (Signed)
Patient ID: Jocelyn Curtis, female   DOB: 06/05/1946, 71 y.o.   MRN: [id number]     DATE:  June 27, 2017  Location:   [place] Room Number: [place] of Service: SNF (31)   Extended Emergency Contact Information Primary Emergency Contact: [place], [place] [address] [address]itter of [place] Phone: [phone] Mobile Phone: [phone] Relation: Daughter Secondary Emergency Contact: [place], [place] [address] [address] Litter of [place] Phone: [phone] Relation: Relative  Advanced Directive information Does Patient Have a Medical Advance Directive?: No, Would patient like information on creating a medical advance directive?: No - Patient declined, Does patient want to make changes to medical advance directive?: No - Patient declined  Chief Complaint  Patient presents with  . Discharge Note    Discharging to Home    HPI:  71 yo female seen today for d/c from SNF. She was a new admission into SNF following hospital stay for spinal stenosis at T12-L1, L1-L2, L2-L3; tobacco abuse, hx DVT s/p IVC filter, PAD with hx bypass sx at carotid artery. She was taken to the OR 06/20/17 for decompressive laminectomy, medial facetectomy, foraminotomy of T12-L1, L1-L2, L2-L3 by Dr Ronnald Ramp. She was tx perioperatively with IV vanco.  Cr 1.38; A1c 6.9%.    Today she reports sinus congestion and post nasal drip. No f/c or sore throat. she has a chronic cough. She smokes 2-3 cigs/day. Pain in back is controlled overall but can get as high as 8/10 on scale prior to pain med. No loss of bowel control but she reports chronic urinary incontinence. She has urge incontinence and leaks when she stands from a seated position. No constipation. Sleeps well. No nursing issues. No falls since SNF admission.   She is being d/c'd to home with [facility] PT/OT (gait training, balance and transfers), CNA (assist with ADLs), RN (med management and disease  mx). She will require front wheel walker and 3-in-1 commode DME.  Past Medical History:  Diagnosis Date  . AAA (abdominal aortic aneurysm) ([place])   . AAA (abdominal aortic aneurysm) ([place])    followed by dr Jeralene Peters for appt  . Abdominal aortic aneurysm without rupture [facility])    followed by Dr. Concepcion Elk  . Anxiety and depression   . Arthritis    "all over body"  . Blood transfusion 62yr ago  . Bruises easily   . Cancer ([place]    HX SKIN CA  . Carotid artery occlusion   . Cerebrovascular disease    pt states she pulls to the left and staggers alot  . CHF (congestive heart failure) ([facility]    USED TO SEE DR GROVE- not seen cardiac dr out of hospital since 2013  . Chronic back pain   . Complication of anesthesia    ENDED WITH BLOOD CLOTT IN LEG- "takes more anesthesia for me"  . COPD (chronic obstructive pulmonary disease) ([facility]   . Coronary artery disease   . Depression    takes Paxil daily  . Diabetes mellitus    type II  . Dizziness   . Dizziness - giddy   . Dysrhythmia    -atrial fib hx no problems now  . Foot drop    right since knee replacement  . GERD (gastroesophageal reflux disease)    takes Protonix daily  . H/O blood clots    in right foot-04/24/11  .  H/O: GI bleed   . History of bladder infections   . Hyperlipidemia    takes Pravastatin daily  . Hypertension    takes  Lisinopril daily, Dr. Ricci Barker. took care of pt. before his retirement   . Osteoarthritis   . Osteoarthritis (arthritis due to wear and tear of joints)   . Peripheral vascular disease ([place])    blood clot in foot following epidural anesthesia - 04/2011  . Pneumonia    hx of;last time about 62yr ago 2008  . Renal insufficiency    [place]  . Shortness of breath    with exertion  . Stroke ([facility]    ministrokes many yrs. ago   . Subclavian steal syndrome   . Subclavian steal syndrome   . Vision loss    left eye    Past Surgical History:  Procedure Laterality  Date  . APPENDECTOMY    . CARDIAC CATHETERIZATION    . CAROTID-SUBCLAVIAN BYPASS GRAFT  05/02/2012   Procedure: BYPASS GRAFT CAROTID-SUBCLAVIAN;  Surgeon: JMal Misty MD;  [place] [facility]  Service: Vascular;  Laterality: Left;  . CARPAL TUNNEL RELEASE Left   . COSMETIC SURGERY     face lift  . ENDARTERECTOMY  05/02/2012   Procedure: ENDARTERECTOMY SUBCLAVIAN;  Surgeon: JMal Misty MD;  [place] [facility]  Service: Vascular;  Laterality: Left;  . EYE SURGERY Bilateral    cataracts  . FOOT SURGERY Left    left-hammer toes  . HAND SURGERY     right  joints  . IR IVC FILTER PLMT / S&I /IMG GUID/MOD SED  06/17/2017  . IR [place] VASC ACCESS RIGHT  06/17/2017  . JOINT REPLACEMENT     bilateral total knee replacement  . LUMBAR LAMINECTOMY/DECOMPRESSION MICRODISCECTOMY N/A 06/20/2017   Procedure: Thoracic 12 Lumbar 1, Lumabr Two-Lumbar Three LAMINECTOMY/FORAMINOTOMY;  Surgeon: JEustace Moore MD;  [facility]  Service: Neurosurgery;  Laterality: N/A;  Thoracic 12 Lumbar 1, Lumabr Two-Lumbar Three LAMINECTOMY/FORAMINOTOMY   . NASAL SINUS SURGERY    . perineal abscess I & D     necrotizing soft tissue infection 2005  . ROTATOR CUFF REPAIR     bilateral  . VAGINAL HYSTERECTOMY      Patient Care Team: ELeonard Downing MD as PCP - General (Family Medicine) DLuanne Bras MD (Radiology)  Social History   Social History  . Marital status: Widowed    Spouse name: N/A  . Number of children: N/A  . Years of education: N/A   Occupational History  . Not on file.   Social History Main Topics  . Smoking status: Current Some Day Smoker    Packs/day: 0.50    Years: 48.00    Types: Cigarettes  . Smokeless tobacco: Former [employer] developer   Types: Snuff    Quit date: 06/18/2016     Comment: uses snuff every once in a while  . Alcohol use 0.0 oz/week     Comment: occ beer  . Drug use: No  . Sexual activity: Yes    Birth control/ protection: Surgical   Other Topics Concern  .  Not on file   Social History Narrative  . No narrative on file     reports that she has been smoking Cigarettes.  She has a 24.00 pack-year smoking history. She quit smokeless tobacco use about a year ago. Her smokeless tobacco use included Snuff. She reports that she drinks alcohol. She reports that she does  not use drugs.  Family History  Problem Relation Age of Onset  . Diabetes Mother   . Heart disease Mother        before age 16  . Hyperlipidemia Mother   . Hypertension Mother   . Heart attack Mother   . Cancer Father   . Heart disease Father        before age 32  . Hyperlipidemia Father   . Hypertension Father   . Heart attack Father   . Deep vein thrombosis Sister   . Diabetes Sister   . Heart disease Sister   . Hyperlipidemia Sister   . Hypertension Sister   . Heart attack Sister   . Diabetes Brother   . Hypertension Brother   . Diabetes Daughter   . Anesthesia problems Neg Hx   . Hypotension Neg Hx   . Malignant hyperthermia Neg Hx   . Pseudochol deficiency Neg Hx    Family Status  Relation Status  . Mother (Not Specified)  . Father (Not Specified)  . Sister (Not Specified)  . Brother (Not Specified)  . Daughter (Not Specified)  . Neg Hx (Not Specified)    Immunization History  Administered Date(s) Administered  . Influenza Split 09/18/2012  . PPD Test 06/26/2017    Allergies  Allergen Reactions  . Lisinopril Swelling, Rash and Anaphylaxis  . Metformin And Related Anaphylaxis  . Nsaids Other (See Comments)    GI BLEED   . Penicillins Itching, Swelling and Rash    Abdominal swelling  . Codeine Itching and Nausea And Vomiting    Medications: Patient's Medications  New Prescriptions   No medications on file  Previous Medications   ASPIRIN 81 MG TABLET    Chew 81 mg by mouth daily.   ATORVASTATIN (LIPITOR) 80 MG TABLET    Take 80 mg by mouth every morning.    GLIMEPIRIDE PO    Take 16 mg by mouth daily with breakfast.    METHOCARBAMOL  (ROBAXIN) 500 MG TABLET    Take 1 tablet (500 mg total) by mouth every 6 (six) hours as needed for muscle spasms.   OXYCODONE-ACETAMINOPHEN (PERCOCET/ROXICET) 5-325 MG TABLET    Take 1 tablet by mouth every 4 (four) hours as needed for moderate pain.   PANTOPRAZOLE (PROTONIX) 40 MG TABLET    Take 40 mg by mouth daily.   PAROXETINE (PAXIL) 30 MG TABLET    Take 30 mg by mouth every morning.    TRAZODONE (DESYREL) 150 MG TABLET    Take 150 mg by mouth at bedtime.  Modified Medications   No medications on file  Discontinued Medications   No medications on file    Review of Systems  HENT: Positive for sinus pain and sinus pressure.   Respiratory: Positive for cough.   Musculoskeletal: Positive for back pain.  Skin: Positive for wound.  All other systems reviewed and are negative.   Vitals:   06/27/17 1201  BP: 112/60  Pulse: 75  Resp: 18  Temp: 97.8 F (36.6 C)  SpO2: 96%  Weight: 208 lb 3.2 oz (94.4 kg)  Height: 5' 8"  (1.727 m)   Body mass index is 31.66 kg/m.  Physical Exam  Constitutional: She appears well-developed and well-nourished.  Frail appearing in NAD, sitting in w/c  HENT:  Mouth/Throat: Oropharynx is clear and moist. No oropharyngeal exudate.  Maxillary sinus TTP; MMM; no oral thrush  Eyes: Pupils are equal, round, and reactive to light. No scleral icterus.  Neck: Neck supple.  Carotid bruit is present (b/l). No tracheal deviation present. No thyromegaly present.  Cardiovascular: Normal rate, regular rhythm and intact distal pulses.  Exam reveals no gallop and no friction rub.   Murmur (1/6 SEM) heard. +1 pitting LE edema b/l. No calf TTP. Distal leg/foot dusky appearance  Pulmonary/Chest: Effort normal and breath sounds normal. No stridor. No respiratory distress. She has no wheezes. She has no rales.  Abdominal: Soft. Normal appearance and bowel sounds are normal. She exhibits no distension and no mass. There is no hepatomegaly. There is no tenderness. There is  no rigidity, no rebound and no guarding. No hernia.  Musculoskeletal: She exhibits edema and tenderness.  T12-L4 midline surgical incision intact with no wound dehiscence. Minimum redness but no d/c or TTP  Lymphadenopathy:    She has no cervical adenopathy.  Neurological: She is alert.  Skin: Skin is warm and dry. No rash noted.  Psychiatric: She has a normal mood and affect. Her behavior is normal. Thought content normal.     Labs reviewed: Admission on 06/20/2017, Discharged on 06/24/2017  Component Date Value Ref Range Status  . Glucose-Capillary 06/20/2017 158* 65 - 99 mg/dL Final  . Comment 1 06/20/2017 Notify RN   Final  . Comment 2 06/20/2017 Document in Chart   Final  . Glucose-Capillary 06/20/2017 161* 65 - 99 mg/dL Final  . Comment 1 06/20/2017 Notify RN   Final  . Glucose-Capillary 06/21/2017 140* 65 - 99 mg/dL Final  . Comment 1 06/21/2017 Notify RN   Final  . Comment 2 06/21/2017 Document in Chart   Final  . Glucose-Capillary 06/24/2017 132* 65 - 99 mg/dL Final  Hospital Outpatient Visit on 06/18/2017  Component Date Value Ref Range Status  . Glucose-Capillary 06/18/2017 144* 65 - 99 mg/dL Final  . MRSA, PCR 06/18/2017 NEGATIVE  NEGATIVE Final  . Staphylococcus aureus 06/18/2017 NEGATIVE  NEGATIVE Final   Comment: (NOTE) The Xpert SA Assay (FDA approved for NASAL specimens in patients 53 years of age and older), is one component of a comprehensive surveillance program. It is not intended to diagnose infection nor to guide or monitor treatment.   . Sodium 06/18/2017 137  135 - 145 mmol/L Final  . Potassium 06/18/2017 4.5  3.5 - 5.1 mmol/L Final  . Chloride 06/18/2017 102  101 - 111 mmol/L Final  . CO2 06/18/2017 26  22 - 32 mmol/L Final  . Glucose, Bld 06/18/2017 131* 65 - 99 mg/dL Final  . BUN 06/18/2017 22* 6 - 20 mg/dL Final  . Creatinine, Ser 06/18/2017 1.38* 0.44 - 1.00 mg/dL Final  . Calcium 06/18/2017 9.5  8.9 - 10.3 mg/dL Final  . GFR calc non Af Amer  06/18/2017 37* >60 mL/min Final  . GFR calc Af Amer 06/18/2017 43* >60 mL/min Final   Comment: (NOTE) The eGFR has been calculated using the CKD EPI equation. This calculation has not been validated in all clinical situations. eGFR's persistently <60 mL/min signify possible Chronic Kidney Disease.   . Anion gap 06/18/2017 9  5 - 15 Final  . WBC 06/18/2017 9.6  4.0 - 10.5 K/uL Final  . RBC 06/18/2017 4.64  3.87 - 5.11 MIL/uL Final  . Hemoglobin 06/18/2017 14.8  12.0 - 15.0 g/dL Final  . HCT 06/18/2017 45.8  36.0 - 46.0 % Final  . MCV 06/18/2017 98.7  78.0 - 100.0 fL Final  . MCH 06/18/2017 31.9  26.0 - 34.0 pg Final  . MCHC 06/18/2017 32.3  30.0 - 36.0 g/dL Final  .  RDW 06/18/2017 13.9  11.5 - 15.5 % Final  . Platelets 06/18/2017 182  150 - 400 K/uL Final  . Neutrophils Relative % 06/18/2017 57  % Final  . Neutro Abs 06/18/2017 5.5  1.7 - 7.7 K/uL Final  . Lymphocytes Relative 06/18/2017 35  % Final  . Lymphs Abs 06/18/2017 3.3  0.7 - 4.0 K/uL Final  . Monocytes Relative 06/18/2017 6  % Final  . Monocytes Absolute 06/18/2017 0.6  0.1 - 1.0 K/uL Final  . Eosinophils Relative 06/18/2017 2  % Final  . Eosinophils Absolute 06/18/2017 0.2  0.0 - 0.7 K/uL Final  . Basophils Relative 06/18/2017 0  % Final  . Basophils Absolute 06/18/2017 0.0  0.0 - 0.1 K/uL Final  . Prothrombin Time 06/18/2017 12.1  11.4 - 15.2 seconds Final  . INR 06/18/2017 0.91   Final  . ABO/RH(D) 06/18/2017 O POS   Final  . Antibody Screen 06/18/2017 NEG   Final  . Sample Expiration 06/18/2017 06/21/2017   Final  . Hgb A1c MFr Bld 06/18/2017 6.9* 4.8 - 5.6 % Final   Comment: (NOTE) Pre diabetes:          5.7%-6.4% Diabetes:              >6.4% Glycemic control for   <7.0% adults with diabetes   . Mean Plasma Glucose 06/18/2017 151.33  mg/dL Final  Hospital Outpatient Visit on 06/17/2017  Component Date Value Ref Range Status  . aPTT 06/17/2017 32  24 - 36 seconds Final  . Sodium 06/17/2017 138  135 - 145  mmol/L Final  . Potassium 06/17/2017 4.0  3.5 - 5.1 mmol/L Final  . Chloride 06/17/2017 102  101 - 111 mmol/L Final  . CO2 06/17/2017 29  22 - 32 mmol/L Final  . Glucose, Bld 06/17/2017 155* 65 - 99 mg/dL Final  . BUN 06/17/2017 25* 6 - 20 mg/dL Final  . Creatinine, Ser 06/17/2017 1.65* 0.44 - 1.00 mg/dL Final  . Calcium 06/17/2017 9.2  8.9 - 10.3 mg/dL Final  . GFR calc non Af Amer 06/17/2017 30* >60 mL/min Final  . GFR calc Af Amer 06/17/2017 35* >60 mL/min Final   Comment: (NOTE) The eGFR has been calculated using the CKD EPI equation. This calculation has not been validated in all clinical situations. eGFR's persistently <60 mL/min signify possible Chronic Kidney Disease.   . Anion gap 06/17/2017 7  5 - 15 Final  . WBC 06/17/2017 9.7  4.0 - 10.5 K/uL Final  . RBC 06/17/2017 4.37  3.87 - 5.11 MIL/uL Final  . Hemoglobin 06/17/2017 14.2  12.0 - 15.0 g/dL Final  . HCT 06/17/2017 43.5  36.0 - 46.0 % Final  . MCV 06/17/2017 99.5  78.0 - 100.0 fL Final  . MCH 06/17/2017 32.5  26.0 - 34.0 pg Final  . MCHC 06/17/2017 32.6  30.0 - 36.0 g/dL Final  . RDW 06/17/2017 14.1  11.5 - 15.5 % Final  . Platelets 06/17/2017 189  150 - 400 K/uL Final  . Prothrombin Time 06/17/2017 12.0  11.4 - 15.2 seconds Final  . INR 06/17/2017 0.89   Final  . Glucose-Capillary 06/17/2017 152* 65 - 99 mg/dL Final    Chest 2 View  Result Date: 06/18/2017 CLINICAL DATA:  Preop evaluation for upcoming lumbar surgery, initial encounter EXAM: CHEST  2 VIEW COMPARISON:  08/18/2014 FINDINGS: Cardiac shadow is within normal limits. The lungs are well aerated bilaterally. Mild scoliosis concave to the left is noted in the upper thoracic  spine. Some accentuation of the mediastinal markings is noted related to patient rotation and scoliosis. These are stable in appearance from prior exam. No acute abnormality is noted. IMPRESSION: No active cardiopulmonary disease. Electronically Signed   By: Inez Catalina M.D.   On: 06/18/2017  13:22   Dg Lumbar Spine 2-3 Views  Result Date: 06/20/2017 CLINICAL DATA:  Elective surgery EXAM: LUMBAR SPINE - 2-3 VIEW COMPARISON:  MRI 05/21/2017 FINDINGS: First cross-table lateral intraoperative image demonstrates a posterior needle directed at the L3 vertebral body. Second lateral intraoperative image demonstrates posterior surgical instruments directed at the L1-2 level. Diffuse degenerative disc disease and facet disease. IMPRESSION: Intraoperative localization as above. Electronically Signed   By: Rolm Baptise M.D.   On: 06/20/2017 15:25   Ir Ivc Filter Plmt / S&i /img Guid/mod Sed  Result Date: 06/17/2017 CLINICAL DATA:  History of postop DVT. Plans for multilevel lumbar fusion surgery. Prophylactic caval filtration is requested. EXAM: INFERIOR VENACAVOGRAM IVC FILTER PLACEMENT UNDER FLUOROSCOPY FLUOROSCOPY TIME:  0.6 minutes, 119 uGym2 DAP TECHNIQUE: Renal vein inflow is an caval anatomy reviewed on patient's prior CT abdomen 10/21/2013. Patency of the right IJ vein was confirmed with ultrasound with image documentation. An appropriate skin site was determined. Skin site was marked, prepped with chlorhexidine, and draped using maximum barrier technique. The region was infiltrated locally with 1% lidocaine. Intravenous Fentanyl and Versed were administered as conscious sedation during continuous monitoring of the patient's level of consciousness and physiological / cardiorespiratory status by the radiology RN, with a total moderate sedation time of 8 minutes. Under real-time ultrasound guidance, the right IJ vein was accessed with a 21 gauge micropuncture needle; the needle tip within the vein was confirmed with ultrasound image documentation. The needle was exchanged over a 018 guidewire for a transitional dilator, which allow advancement of the [facility] wire into the IVC. A long 6 French vascular sheath was placed for inferior venacavography. This demonstrated no caval thrombus. Renal vein inflows  were evident. The [facility] IVC filter was advanced through the sheath and successfully deployed under fluoroscopy at the L2-3 level. Followup cavagram demonstrates stable filter position and no evident complication. The sheath was removed and hemostasis achieved at the site. No immediate complication. IMPRESSION: 1. Normal IVC. No thrombus or significant anatomic variation. 2. Technically successful infrarenal IVC filter placement. This is a retrievable model. PLAN: This IVC filter is potentially retrievable. The patient will be assessed for filter retrieval by Interventional Radiology in approximately 8-12 weeks. Further recommendations regarding filter retrieval, continued surveillance or declaration of device permanence, will be made at that time. Electronically Signed   By: Lucrezia Europe M.D.   On: 06/17/2017 10:02   Ir US Guide Vasc Access Right  Result Date: 06/17/2017 CLINICAL DATA:  History of postop DVT. Plans for multilevel lumbar fusion surgery. Prophylactic caval filtration is requested. EXAM: INFERIOR VENACAVOGRAM IVC FILTER PLACEMENT UNDER FLUOROSCOPY FLUOROSCOPY TIME:  0.6 minutes, 119 uGym2 DAP TECHNIQUE: Renal vein inflow is an caval anatomy reviewed on patient's prior CT abdomen 10/21/2013. Patency of the right IJ vein was confirmed with ultrasound with image documentation. An appropriate skin site was determined. Skin site was marked, prepped with chlorhexidine, and draped using maximum barrier technique. The region was infiltrated locally with 1% lidocaine. Intravenous Fentanyl and Versed were administered as conscious sedation during continuous monitoring of the patient's level of consciousness and physiological / cardiorespiratory status by the radiology RN, with a total moderate sedation time of 8 minutes. Under real-time ultrasound guidance, the  right IJ vein was accessed with a 21 gauge micropuncture needle; the needle tip within the vein was confirmed with ultrasound image documentation. The  needle was exchanged over a 018 guidewire for a transitional dilator, which allow advancement of the [facility] wire into the IVC. A long 6 French vascular sheath was placed for inferior venacavography. This demonstrated no caval thrombus. Renal vein inflows were evident. The [facility] IVC filter was advanced through the sheath and successfully deployed under fluoroscopy at the L2-3 level. Followup cavagram demonstrates stable filter position and no evident complication. The sheath was removed and hemostasis achieved at the site. No immediate complication. IMPRESSION: 1. Normal IVC. No thrombus or significant anatomic variation. 2. Technically successful infrarenal IVC filter placement. This is a retrievable model. PLAN: This IVC filter is potentially retrievable. The patient will be assessed for filter retrieval by Interventional Radiology in approximately 8-12 weeks. Further recommendations regarding filter retrieval, continued surveillance or declaration of device permanence, will be made at that time. Electronically Signed   By: Lucrezia Europe M.D.   On: 06/17/2017 10:02     Assessment/Plan   ICD-10-CM   1. S/P lumbar laminectomy 2/2 #2 Z98.890   2. Spinal stenosis of lumbar region with neurogenic claudication M48.062   3. Urge incontinence N39.41   4. CKD (chronic kidney disease) stage 3, GFR 30-59 ml/min (HCC) N18.3   5. Type 2 diabetes mellitus with stage 3 chronic kidney disease, without long-term current use of insulin (HCC) E11.22    N18.3   6. Continuous tobacco abuse Z72.0   7. PVD (peripheral vascular disease) (HCC) I73.9   8. Chronic pain of both shoulders M25.511    G89.29    M25.512   9. Seasonal allergic rhinitis, unspecified trigger J30.2     CBG daily and record  Reduce amaryl to 57m daily  Start loratidine 124mdaily for seasonal allergy  Check CMP next draw to f/u CKD  Cont other meds as ordered  PT/OT as ordered  Smoking cessation discussed and highly urged  May need  urology eval for OAB sx's. She declines at this time  Physiatrist to follow. No need for increase in pain med or muscle relaxer at this time per physiatry. voltaren gel rx for shoulder pain  F/u with Neurosx Dr JoRonnald Rampor post op care  Patient is being discharged with home health services:  PT/OT, CNA and RN  Patient is being discharged with the following durable medical equipment:  Front wheel walker; 3-in-1 commode  Patient has been advised to f/u with their PCP in 1-2 weeks for a transitions of care visit. (Social services at their facility was responsible for arranging this appointment.)  Pt was provided with adequate prescriptions of noncontrolled medications to reach their scheduled appointment . For controlled substances, a limited supply was provided as appropriate for the individual patient. If the pt normally receives these medications from a pain clinic or has a contract with another physician, these medications should be received from that clinic or physician only.  Future labs/tests needed:  CMP, urology eval for OAB/urgency incontinence  TIME SPENT (MINUTES): 50BixbyCaPerlie GoldPiDalton [facility] N[facility] Associates[facility] [address] StreetrDalton[place] [phone] (Monday-Friday 8 AM - 5 PM) [phone] 5 PM and follow prompts

## 2017-06-28 LAB — HEPATIC FUNCTION PANEL
ALK PHOS: 100 (ref 25–125)
ALT: 12 (ref 7–35)
AST: 16 (ref 13–35)
Bilirubin, Total: 0.4

## 2017-06-28 LAB — BASIC METABOLIC PANEL
BUN: 29 — AB (ref 4–21)
Creatinine: 1.4 — AB (ref 0.5–1.1)
GLUCOSE: 141
Potassium: 4.5 (ref 3.4–5.3)
Sodium: 139 (ref 137–147)

## 2017-07-01 DIAGNOSIS — R531 Weakness: Secondary | ICD-10-CM | POA: Diagnosis not present

## 2017-07-01 DIAGNOSIS — E119 Type 2 diabetes mellitus without complications: Secondary | ICD-10-CM | POA: Diagnosis not present

## 2017-07-01 DIAGNOSIS — Z4789 Encounter for other orthopedic aftercare: Secondary | ICD-10-CM | POA: Diagnosis not present

## 2017-07-01 DIAGNOSIS — M62838 Other muscle spasm: Secondary | ICD-10-CM | POA: Diagnosis not present

## 2017-07-01 DIAGNOSIS — Z7984 Long term (current) use of oral hypoglycemic drugs: Secondary | ICD-10-CM | POA: Diagnosis not present

## 2017-07-01 DIAGNOSIS — R69 Illness, unspecified: Secondary | ICD-10-CM | POA: Diagnosis not present

## 2017-07-02 NOTE — Anesthesia Postprocedure Evaluation (Addendum)
Anesthesia Post Note  Patient: Jocelyn Curtis  Procedure(s) Performed: Thoracic 12 Lumbar 1, Lumabr Two-Lumbar Three LAMINECTOMY/FORAMINOTOMY (N/A Back)     Patient location during evaluation: PACU Anesthesia Type: General Level of consciousness: awake and sedated Pain management: pain level controlled Respiratory status: spontaneous breathing Cardiovascular status: stable Postop Assessment: no apparent nausea or vomiting Anesthetic complications: no    Last Vitals:  Vitals:   06/24/17 0935 06/24/17 1327  BP: 126/66 101/62  Pulse: 79 81  Resp: 18 18  Temp: 36.8 C 37.2 C  SpO2: 97% 96%    Last Pain:  Vitals:   06/24/17 1327  TempSrc: Oral  PainSc:    Pain Goal: Patients Stated Pain Goal: 4 (06/21/17 2147)               Rosalee Tolley JR,JOHN Mateo Flow

## 2017-07-04 DIAGNOSIS — G8929 Other chronic pain: Secondary | ICD-10-CM | POA: Diagnosis not present

## 2017-07-04 DIAGNOSIS — M5136 Other intervertebral disc degeneration, lumbar region: Secondary | ICD-10-CM | POA: Diagnosis not present

## 2017-07-15 ENCOUNTER — Encounter: Payer: Self-pay | Admitting: Interventional Radiology

## 2017-07-31 ENCOUNTER — Other Ambulatory Visit (HOSPITAL_COMMUNITY): Payer: Self-pay | Admitting: Interventional Radiology

## 2017-07-31 DIAGNOSIS — Z86718 Personal history of other venous thrombosis and embolism: Secondary | ICD-10-CM

## 2017-08-14 ENCOUNTER — Other Ambulatory Visit: Payer: Self-pay

## 2017-08-14 ENCOUNTER — Emergency Department (HOSPITAL_COMMUNITY): Payer: Medicare HMO

## 2017-08-14 ENCOUNTER — Encounter (HOSPITAL_COMMUNITY): Payer: Self-pay

## 2017-08-14 DIAGNOSIS — Z7982 Long term (current) use of aspirin: Secondary | ICD-10-CM | POA: Insufficient documentation

## 2017-08-14 DIAGNOSIS — R0602 Shortness of breath: Secondary | ICD-10-CM | POA: Diagnosis not present

## 2017-08-14 DIAGNOSIS — Z886 Allergy status to analgesic agent status: Secondary | ICD-10-CM | POA: Diagnosis not present

## 2017-08-14 DIAGNOSIS — R69 Illness, unspecified: Secondary | ICD-10-CM | POA: Diagnosis not present

## 2017-08-14 DIAGNOSIS — Z7983 Long term (current) use of bisphosphonates: Secondary | ICD-10-CM | POA: Insufficient documentation

## 2017-08-14 DIAGNOSIS — Z79899 Other long term (current) drug therapy: Secondary | ICD-10-CM | POA: Diagnosis not present

## 2017-08-14 DIAGNOSIS — R0789 Other chest pain: Secondary | ICD-10-CM | POA: Insufficient documentation

## 2017-08-14 DIAGNOSIS — Z880 Allergy status to penicillin: Secondary | ICD-10-CM | POA: Diagnosis not present

## 2017-08-14 DIAGNOSIS — J449 Chronic obstructive pulmonary disease, unspecified: Secondary | ICD-10-CM | POA: Insufficient documentation

## 2017-08-14 DIAGNOSIS — F17210 Nicotine dependence, cigarettes, uncomplicated: Secondary | ICD-10-CM | POA: Insufficient documentation

## 2017-08-14 DIAGNOSIS — R079 Chest pain, unspecified: Secondary | ICD-10-CM | POA: Diagnosis not present

## 2017-08-14 DIAGNOSIS — I110 Hypertensive heart disease with heart failure: Secondary | ICD-10-CM | POA: Insufficient documentation

## 2017-08-14 DIAGNOSIS — I509 Heart failure, unspecified: Secondary | ICD-10-CM | POA: Diagnosis not present

## 2017-08-14 DIAGNOSIS — E119 Type 2 diabetes mellitus without complications: Secondary | ICD-10-CM | POA: Diagnosis not present

## 2017-08-14 DIAGNOSIS — Z885 Allergy status to narcotic agent status: Secondary | ICD-10-CM | POA: Diagnosis not present

## 2017-08-14 LAB — BASIC METABOLIC PANEL
Anion gap: 11 (ref 5–15)
BUN: 21 mg/dL — AB (ref 6–20)
CO2: 23 mmol/L (ref 22–32)
CREATININE: 1.51 mg/dL — AB (ref 0.44–1.00)
Calcium: 9.6 mg/dL (ref 8.9–10.3)
Chloride: 103 mmol/L (ref 101–111)
GFR calc Af Amer: 39 mL/min — ABNORMAL LOW (ref >60)
GFR, EST NON AFRICAN AMERICAN: 34 mL/min — AB (ref >60)
GLUCOSE: 166 mg/dL — AB (ref 65–99)
Potassium: 4.1 mmol/L (ref 3.5–5.1)
Sodium: 137 mmol/L (ref 135–145)

## 2017-08-14 LAB — CBC
HCT: 43.3 % (ref 36.0–46.0)
Hemoglobin: 14.8 g/dL (ref 12.0–15.0)
MCH: 32.8 pg (ref 26.0–34.0)
MCHC: 34.2 g/dL (ref 30.0–36.0)
MCV: 96 fL (ref 78.0–100.0)
PLATELETS: 205 10*3/uL (ref 150–400)
RBC: 4.51 MIL/uL (ref 3.87–5.11)
RDW: 13.5 % (ref 11.5–15.5)
WBC: 9.6 10*3/uL (ref 4.0–10.5)

## 2017-08-14 LAB — I-STAT TROPONIN, ED: Troponin i, poc: 0 ng/mL (ref 0.00–0.08)

## 2017-08-14 NOTE — ED Triage Notes (Signed)
Pt reports having IVC filter placed on right side of neck 06/19/2017 prior to surgical procedure. This past Friday pt began having tingling, pain, and pressure to right chest, neck, and under breast. PT states the pain is causing labored breathing.

## 2017-08-15 ENCOUNTER — Emergency Department (HOSPITAL_COMMUNITY)
Admission: EM | Admit: 2017-08-15 | Discharge: 2017-08-15 | Disposition: A | Discharge status: Home / Self Care | Payer: Medicare HMO | Attending: Emergency Medicine | Admitting: Emergency Medicine

## 2017-08-15 DIAGNOSIS — Z880 Allergy status to penicillin: Secondary | ICD-10-CM | POA: Diagnosis not present

## 2017-08-15 DIAGNOSIS — Z7983 Long term (current) use of bisphosphonates: Secondary | ICD-10-CM | POA: Diagnosis not present

## 2017-08-15 DIAGNOSIS — R0789 Other chest pain: Secondary | ICD-10-CM

## 2017-08-15 DIAGNOSIS — J449 Chronic obstructive pulmonary disease, unspecified: Secondary | ICD-10-CM | POA: Diagnosis not present

## 2017-08-15 DIAGNOSIS — I509 Heart failure, unspecified: Secondary | ICD-10-CM | POA: Diagnosis not present

## 2017-08-15 DIAGNOSIS — E119 Type 2 diabetes mellitus without complications: Secondary | ICD-10-CM | POA: Diagnosis not present

## 2017-08-15 DIAGNOSIS — Z7982 Long term (current) use of aspirin: Secondary | ICD-10-CM | POA: Diagnosis not present

## 2017-08-15 DIAGNOSIS — R69 Illness, unspecified: Secondary | ICD-10-CM | POA: Diagnosis not present

## 2017-08-15 DIAGNOSIS — Z79899 Other long term (current) drug therapy: Secondary | ICD-10-CM | POA: Diagnosis not present

## 2017-08-15 DIAGNOSIS — I110 Hypertensive heart disease with heart failure: Secondary | ICD-10-CM | POA: Diagnosis not present

## 2017-08-15 MED ORDER — LORAZEPAM 1 MG PO TABS
1.0000 mg | ORAL_TABLET | Freq: Once | ORAL | Status: AC
  Administered 2017-08-15: 1 mg via ORAL
  Filled 2017-08-15: qty 1

## 2017-08-15 NOTE — ED Provider Notes (Signed)
[facility] EMERGENCY DEPARTMENT Provider Note   CSN: [id number] Arrival date & time: 08/14/17  1859     History   Chief Complaint Chief Complaint  Patient presents with  . Chest Pain  . Shortness of Breath    HPI Jocelyn Curtis is a 71 y.o. female.  HPI Patient is a 71 year old female presents the emergency department with complaints of 5 days of right-sided neck and right trapezius pressure.  Her symptoms been constant.  She reports mild occasional shortness of breath.  She currently has an IVC filter in place.  This is a retrievable filter and is scheduled to be retrieved in December by interventional radiology.  No fevers and chills.  No cough.  No recent injury or trauma.  She has chronic pain in the right shoulder and her family states that she always keeps her right arm in with her shoulder up.  She denies new rash.  Denies recent injury or trauma.  No ear or dental pain.  She is on oxycodone at home.  Her symptoms are mild in severity at this time.  Patient and family were concerned this could be secondary to the IVC filter.  IVC filter was placed so that she could have surgery on her back by Dr. Ronnald Ramp.  This has been completed and was without complications   Past Medical History:  Diagnosis Date  . AAA (abdominal aortic aneurysm) ([place])   . AAA (abdominal aortic aneurysm) ([place])    followed by dr Jeralene Peters for appt  . Abdominal aortic aneurysm without rupture [facility])    followed by Dr. Concepcion Elk  . Anxiety and depression   . Arthritis    "all over body"  . Blood transfusion 59yrs ago  . Bruises easily   . Cancer ([place])    HX SKIN CA  . Carotid artery occlusion   . Cerebrovascular disease    pt states she pulls to the left and staggers alot  . CHF (congestive heart failure) ([place])    USED TO SEE DR GROVE- not seen cardiac dr out of hospital since 2013  . Chronic back pain   . Complication of anesthesia    ENDED WITH BLOOD CLOTT IN LEG- "takes more  anesthesia for me"  . COPD (chronic obstructive pulmonary disease) ([place])   . Coronary artery disease   . Depression    takes Paxil daily  . Diabetes mellitus    type II  . Dizziness   . Dizziness - giddy   . Dysrhythmia    -atrial fib hx no problems now  . Foot drop    right since knee replacement  . GERD (gastroesophageal reflux disease)    takes Protonix daily  . H/O blood clots    in right foot-04/24/11  . H/O: GI bleed   . History of bladder infections   . Hyperlipidemia    takes Pravastatin daily  . Hypertension    takes  Lisinopril daily, Dr. Ricci Barker. took care of pt. before his retirement   . Osteoarthritis   . Osteoarthritis (arthritis due to wear and tear of joints)   . Peripheral vascular disease ([place])    blood clot in foot following epidural anesthesia - 04/2011  . Pneumonia    hx of;last time about 49yrs ago 2008  . Renal insufficiency    SEE MED DR Wel[place] Flakes [place]  . Shortness of breath    with exertion  . Stroke [facility])    ministrokes many yrs. ago   .  Subclavian steal syndrome   . Subclavian steal syndrome   . Vision loss    left eye    Patient Active Problem List   Diagnosis Date Noted  . Spinal stenosis of lumbar region 06/25/2017  . Dyslipidemia 06/25/2017  . Controlled diabetes mellitus type II without complication ([place]) 25/85/2778  . GERD without esophagitis 06/25/2017  . Depression 06/25/2017  . S/P lumbar laminectomy 06/20/2017  . Occlusion and stenosis of carotid artery without mention of cerebral infarction 12/09/2012  . 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 Hypotension 09/16/2012  . Altered mental status 09/15/2012  . Polypharmacy 09/15/2012  . Encephalopathy acute 09/15/2012  . TIA (transient ischemic attack) 09/15/2012  . Weakness 09/14/2012  . 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    Past Surgical History:  Procedure Laterality Date  . APPENDECTOMY    . CARDIAC CATHETERIZATION    . CAROTID-SUBCLAVIAN BYPASS  GRAFT  05/02/2012   Procedure: BYPASS GRAFT CAROTID-SUBCLAVIAN;  Surgeon: Mal Misty, MD;  Location: [facility];  Service: Vascular;  Laterality: Left;  . CARPAL TUNNEL RELEASE Left   . COSMETIC SURGERY     face lift  . ENDARTERECTOMY  05/02/2012   Procedure: ENDARTERECTOMY SUBCLAVIAN;  Surgeon: Mal Misty, MD;  Location: [facility];  Service: Vascular;  Laterality: Left;  . EYE SURGERY Bilateral    cataracts  . FOOT SURGERY Left    left-hammer toes  . HAND SURGERY     right  joints  . IR IVC FILTER PLMT / S&I /IMG GUID/MOD SED  06/17/2017  . IR RADIOLOGIST EVAL & MGMT  06/12/2017  . IR US GUIDE VASC ACCESS RIGHT  06/17/2017  . JOINT REPLACEMENT     bilateral total knee replacement  . LUMBAR LAMINECTOMY/DECOMPRESSION MICRODISCECTOMY N/A 06/20/2017   Procedure: Thoracic 12 Lumbar 1, Lumabr Two-Lumbar Three LAMINECTOMY/FORAMINOTOMY;  Surgeon: Eustace Moore, MD;  Location: [facility];  Service: Neurosurgery;  Laterality: N/A;  Thoracic 12 Lumbar 1, Lumabr Two-Lumbar Three LAMINECTOMY/FORAMINOTOMY   . NASAL SINUS SURGERY    . perineal abscess I & D     necrotizing soft tissue infection 2005  . ROTATOR CUFF REPAIR     bilateral  . VAGINAL HYSTERECTOMY      OB History    No data available       Home Medications    Prior to Admission medications   Medication Sig Start Date End Date Taking? Authorizing Provider  aspirin 81 MG tablet Chew 81 mg by mouth daily.   Yes [provider]  atorvastatin (LIPITOR) 80 MG tablet Take 80 mg by mouth every morning.    Yes [provider]  glimepiride (AMARYL) 4 MG tablet Take 8 mg by mouth daily with breakfast.    Yes [provider]  oxyCODONE-acetaminophen (PERCOCET/ROXICET) 5-325 MG tablet Take 1 tablet by mouth every 4 (four) hours as needed for moderate pain. 06/27/17  Yes Eulas Post, Monica, DO  pantoprazole (PROTONIX) 40 MG tablet Take 40 mg by mouth daily.   Yes [provider]  traZODone (DESYREL) 150 MG tablet Take 150  mg by mouth at bedtime.   Yes [provider]  methocarbamol (ROBAXIN) 500 MG tablet Take 1 tablet (500 mg total) by mouth every 6 (six) hours as needed for muscle spasms. Patient not taking: Reported on 08/15/2017 06/22/17   Traci Sermon, PA-C    Family History Family History  Problem Relation Age of Onset  . Diabetes Mother   . Heart disease Mother  before age 81  . Hyperlipidemia Mother   . Hypertension Mother   . Heart attack Mother   . Cancer Father   . Heart disease Father        before age 10  . Hyperlipidemia Father   . Hypertension Father   . Heart attack Father   . Deep vein thrombosis Sister   . Diabetes Sister   . Heart disease Sister   . Hyperlipidemia Sister   . Hypertension Sister   . Heart attack Sister   . Diabetes Brother   . Hypertension Brother   . Diabetes Daughter   . Anesthesia problems Neg Hx   . Hypotension Neg Hx   . Malignant hyperthermia Neg Hx   . Pseudochol deficiency Neg Hx     Social History Social History   Tobacco Use  . Smoking status: Current Some Day Smoker    Packs/day: 0.50    Years: 48.00    Pack years: 24.00    Types: Cigarettes  . Smokeless tobacco: Former Systems developer    Types: Snuff    Quit date: 06/18/2016  . Tobacco comment: uses snuff every once in a while  Substance Use Topics  . Alcohol use: Yes    Alcohol/week: 0.0 oz    Comment: occ beer  . Drug use: No     Allergies   Lisinopril; Metformin and related; Nsaids; Penicillins; and Codeine   Review of Systems Review of Systems  All other systems reviewed and are negative.    Physical Exam Updated Vital Signs BP 130/74   Pulse 75   Temp 98.6 F (37 C) (Oral)   Resp 18   Ht 5\' 7"  (1.702 m)   Wt 93 kg (205 lb)   SpO2 100%   BMI 32.11 kg/m   Physical Exam  Constitutional: She is oriented to person, place, and time. She appears well-developed and well-nourished. No distress.  HENT:  Head: Normocephalic and atraumatic.  Eyes: EOM  are normal.  Neck: Normal range of motion.  Cardiovascular: Normal rate, regular rhythm and normal heart sounds.  Pulmonary/Chest: Effort normal and breath sounds normal.  Abdominal: Soft. She exhibits no distension. There is no tenderness.  Musculoskeletal: Normal range of motion.  Mild pain with range of motion of right shoulder.  Mild spasm noted in the right trapezius region.  Mild tenderness in the right supraclavicular neck and trapezius region.  No bruit.  No crepitus of the neck.  No rash.  Normal right radial pulse.  No swelling of the right upper extremity as compared to the left  Neurological: She is alert and oriented to person, place, and time.  Skin: Skin is warm and dry.  Psychiatric: She has a normal mood and affect. Judgment normal.  Nursing note and vitals reviewed.    ED Treatments / Results  Labs (all labs ordered are listed, but only abnormal results are displayed) Labs Reviewed  BASIC METABOLIC PANEL - Abnormal; Notable for the following components:      Result Value   Glucose, Bld 166 (*)    BUN 21 (*)    Creatinine, Ser 1.51 (*)    GFR calc non Af Amer 34 (*)    GFR calc Af Amer 39 (*)    All other components within normal limits  CBC  I-STAT TROPONIN, ED    EKG  EKG Interpretation  Date/Time:  Wednesday August 14 2017 19:06:56 EST Ventricular Rate:  80 PR Interval:  142 QRS Duration: 82 QT Interval:  372  QTC Calculation: 429 R Axis:   27 Text Interpretation:  Normal sinus rhythm Cannot rule out Anterior infarct , age undetermined Abnormal ECG No significant change was found Confirmed by Jola Schmidt [phone]) on 08/15/2017 2:21:43 AM       Radiology Dg Chest 2 View  Result Date: 08/14/2017 CLINICAL DATA:  Chest pain.  Shortness of breath. EXAM: CHEST  2 VIEW COMPARISON:  06/18/2017 FINDINGS: Cardiomediastinal silhouette is normal. Mediastinal contours appear intact. Calcific atherosclerotic disease and tortuosity of the aorta. There is no  evidence of focal airspace consolidation, pleural effusion or pneumothorax. Osseous structures are without acute abnormality. Soft tissues are grossly normal. IMPRESSION: Calcific atherosclerotic disease and tortuosity of the aorta. No evidence of pulmonary consolidation. Electronically Signed   By: Fidela Salisbury M.D.   On: 08/14/2017 20:07    Procedures Procedures (including critical care time)  Medications Ordered in ED Medications  LORazepam (ATIVAN) tablet 1 mg (not administered)     Initial Impression / Assessment and Plan / ED Course  I have reviewed the triage vital signs and the nursing notes.  Pertinent labs & imaging results that were available during my care of the patient were reviewed by me and considered in my medical decision making (see chart for details).     Doubt ACS.  Doubt PE.  Suspect musculoskeletal type pain.  Patient requesting something for her nerves.  Home with single dose of oral Ativan here in the emergency department.  Close primary care follow-up.  I do not believe this is a complication of her IVC filter  Final Clinical Impressions(s) / ED Diagnoses   Final diagnoses:  Atypical chest pain    ED Discharge Orders    None       Jola Schmidt, MD 08/15/17 [phone]

## 2017-09-03 DIAGNOSIS — E119 Type 2 diabetes mellitus without complications: Secondary | ICD-10-CM | POA: Diagnosis not present

## 2017-09-03 DIAGNOSIS — G4700 Insomnia, unspecified: Secondary | ICD-10-CM | POA: Diagnosis not present

## 2017-09-03 DIAGNOSIS — G8929 Other chronic pain: Secondary | ICD-10-CM | POA: Diagnosis not present

## 2017-09-04 ENCOUNTER — Telehealth (HOSPITAL_COMMUNITY): Payer: Self-pay

## 2017-09-04 ENCOUNTER — Encounter: Payer: Self-pay | Admitting: Radiology

## 2017-09-04 ENCOUNTER — Ambulatory Visit
Admission: RE | Admit: 2017-09-04 | Discharge: 2017-09-04 | Disposition: A | Discharge status: Home / Self Care | Payer: Medicare HMO | Source: Ambulatory Visit | Attending: Interventional Radiology | Admitting: Interventional Radiology

## 2017-09-04 ENCOUNTER — Other Ambulatory Visit: Payer: Self-pay | Admitting: Interventional Radiology

## 2017-09-04 DIAGNOSIS — Z86718 Personal history of other venous thrombosis and embolism: Secondary | ICD-10-CM

## 2017-09-04 DIAGNOSIS — Z4589 Encounter for adjustment and management of other implanted devices: Secondary | ICD-10-CM | POA: Diagnosis not present

## 2017-09-04 DIAGNOSIS — Z95828 Presence of other vascular implants and grafts: Secondary | ICD-10-CM

## 2017-09-04 DIAGNOSIS — M4326 Fusion of spine, lumbar region: Secondary | ICD-10-CM | POA: Diagnosis not present

## 2017-09-04 HISTORY — PX: IR RADIOLOGIST EVAL & MGMT: IMG5224

## 2017-09-04 NOTE — Telephone Encounter (Signed)
Called to schedule ivc filter retrieval, left message for pt to return call. AW

## 2017-09-04 NOTE — Progress Notes (Signed)
Patient ID: Jocelyn Curtis, female   DOB: [birth year], 71 y.o.   MRN: [id number]       Chief Complaint: Patient was seen in consultation today for possible retrieval of IVC filter at the request of Zaina Jenkin  Referring Physician(s): Eustace Moore  History of Present Illness: Jocelyn Curtis is a 71 y.o. female referred to our service09/08/2017 for placement of a retrievable IVC filter preoperatively. She had a history of previous lower extremity DVT post total knee replacement and plans for posterior post multilevel posterior spinal fusion surgery 06/20/2017. The filter was placed 06/17/2017 uneventfully. She states that surgery went well without complication. Ultrasound today shows no lower extremity DVT. She's on aspirin..  Past Medical History:  Diagnosis Date  . AAA (abdominal aortic aneurysm) ([place])   . AAA (abdominal aortic aneurysm) ([place])    followed by dr Jeralene Peters for appt  . Abdominal aortic aneurysm without rupture [facility])    followed by Dr. Concepcion Elk  . Anxiety and depression   . Arthritis    "all over body"  . Blood transfusion 22yrs ago  . Bruises easily   . Cancer ([place])    HX SKIN CA  . Carotid artery occlusion   . Cerebrovascular disease    pt states she pulls to the left and staggers alot  . CHF (congestive heart failure) ([place])    USED TO SEE DR GROVE- not seen cardiac dr out of hospital since 2013  . Chronic back pain   . Complication of anesthesia    ENDED WITH BLOOD CLOTT IN LEG- "takes more anesthesia for me"  . COPD (chronic obstructive pulmonary disease) ([place])   . Coronary artery disease   . Depression    takes Paxil daily  . Diabetes mellitus    type II  . Dizziness   . Dizziness - giddy   . Dysrhythmia    -atrial fib hx no problems now  . Foot drop    right since knee replacement  . GERD (gastroesophageal reflux disease)    takes Protonix daily  . H/O blood clots    in right foot-04/24/11  . H/O: GI bleed   . History of bladder  infections   . Hyperlipidemia    takes Pravastatin daily  . Hypertension    takes  Lisinopril daily, Dr. Ricci Barker. took care of pt. before his retirement   . Osteoarthritis   . Osteoarthritis (arthritis due to wear and tear of joints)   . Peripheral vascular disease ([place])    blood clot in foot following epidural anesthesia - 04/2011  . Pneumonia    hx of;last time about 64yrs ago 2008  . Renal insufficiency    SEE MED DR Wel[place] Flakes [place]  . Shortness of breath    with exertion  . Stroke [facility])    ministrokes many yrs. ago   . Subclavian steal syndrome   . Subclavian steal syndrome   . Vision loss    left eye    Past Surgical History:  Procedure Laterality Date  . APPENDECTOMY    . CARDIAC CATHETERIZATION    . CAROTID-SUBCLAVIAN BYPASS GRAFT  05/02/2012   Procedure: BYPASS GRAFT CAROTID-SUBCLAVIAN;  Surgeon: Mal Misty, MD;  Location: [facility];  Service: Vascular;  Laterality: Left;  . CARPAL TUNNEL RELEASE Left   . COSMETIC SURGERY     face lift  . ENDARTERECTOMY  05/02/2012   Procedure: ENDARTERECTOMY SUBCLAVIAN;  Surgeon: Mal Misty, MD;  Location: [facility];  Service:  Vascular;  Laterality: Left;  . EYE SURGERY Bilateral    cataracts  . FOOT SURGERY Left    left-hammer toes  . HAND SURGERY     right  joints  . IR IVC FILTER PLMT / S&I /IMG GUID/MOD SED  06/17/2017  . IR RADIOLOGIST EVAL & MGMT  06/12/2017  . IR RADIOLOGIST EVAL & MGMT  09/04/2017  . IR US GUIDE VASC ACCESS RIGHT  06/17/2017  . JOINT REPLACEMENT     bilateral total knee replacement  . LUMBAR LAMINECTOMY/DECOMPRESSION MICRODISCECTOMY N/A 06/20/2017   Procedure: Thoracic 12 Lumbar 1, Lumabr Two-Lumbar Three LAMINECTOMY/FORAMINOTOMY;  Surgeon: Eustace Moore, MD;  Location: [facility];  Service: Neurosurgery;  Laterality: N/A;  Thoracic 12 Lumbar 1, Lumabr Two-Lumbar Three LAMINECTOMY/FORAMINOTOMY   . NASAL SINUS SURGERY    . perineal abscess I & D     necrotizing soft tissue infection 2005  . ROTATOR CUFF  REPAIR     bilateral  . VAGINAL HYSTERECTOMY      Allergies: Lisinopril; Metformin and related; Nsaids; Penicillins; and Codeine  Medications: Prior to Admission medications   Medication Sig Start Date End Date Taking? Authorizing Provider  aspirin 81 MG tablet Chew 81 mg by mouth daily.    [provider]  atorvastatin (LIPITOR) 80 MG tablet Take 80 mg by mouth every morning.     [provider]  glimepiride (AMARYL) 4 MG tablet Take 8 mg by mouth daily with breakfast.     [provider]  methocarbamol (ROBAXIN) 500 MG tablet Take 1 tablet (500 mg total) by mouth every 6 (six) hours as needed for muscle spasms. Patient not taking: Reported on 08/15/2017 06/22/17   Traci Sermon, PA-C  oxyCODONE-acetaminophen (PERCOCET/ROXICET) 5-325 MG tablet Take 1 tablet by mouth every 4 (four) hours as needed for moderate pain. 06/27/17   Gildardo Cranker, DO  pantoprazole (PROTONIX) 40 MG tablet Take 40 mg by mouth daily.    [provider]  traZODone (DESYREL) 150 MG tablet Take 150 mg by mouth at bedtime.    [provider]     Family History  Problem Relation Age of Onset  . Diabetes Mother   . Heart disease Mother        before age 22  . Hyperlipidemia Mother   . Hypertension Mother   . Heart attack Mother   . Cancer Father   . Heart disease Father        before age 10  . Hyperlipidemia Father   . Hypertension Father   . Heart attack Father   . Deep vein thrombosis Sister   . Diabetes Sister   . Heart disease Sister   . Hyperlipidemia Sister   . Hypertension Sister   . Heart attack Sister   . Diabetes Brother   . Hypertension Brother   . Diabetes Daughter   . Anesthesia problems Neg Hx   . Hypotension Neg Hx   . Malignant hyperthermia Neg Hx   . Pseudochol deficiency Neg Hx     Social History   Socioeconomic History  . Marital status: Widowed    Spouse name: Not on file  . Number of children: Not on file  . Years of  education: Not on file  . Highest education level: Not on file  Social Needs  . Financial resource strain: Not on file  . Food insecurity - worry: Not on file  . Food insecurity - inability: Not on file  . Transportation needs - medical: Not  on file  . Transportation needs - non-medical: Not on file  Occupational History  . Not on file  Tobacco Use  . Smoking status: Current Some Day Smoker    Packs/day: 0.50    Years: 48.00    Pack years: 24.00    Types: Cigarettes  . Smokeless tobacco: Former Systems developer    Types: Snuff    Quit date: 06/18/2016  . Tobacco comment: uses snuff every once in a while  Substance and Sexual Activity  . Alcohol use: Yes    Alcohol/week: 0.0 oz    Comment: occ beer  . Drug use: No  . Sexual activity: Yes    Birth control/protection: Surgical  Other Topics Concern  . Not on file  Social History Narrative  . Not on file    ECOG Status: 2 - Symptomatic, <50% confined to bed  Review of Systems: A 12 point ROS discussed and pertinent positives are indicated in the HPI above.  All other systems are negative.  Review of Systems  Vital Signs: There were no vitals taken for this visit.  Physical Exam Constitutional: Oriented to person, place, and time. Well-developed and well-nourished. No distress.  she was examined seated in a wheelchair. HENT:  Head: Normocephalic and atraumatic.  Eyes: Conjunctivae and EOM are normal. Right eye exhibits no discharge. Left eye exhibits no discharge. No scleral icterus.  Neck: No JVD present.  Pulmonary/Chest: Effort normal. No stridor. No respiratory distress.  Abdomen: soft, non distended Neurological:  alert and oriented to person, place, and time.  Skin: Skin is warm and dry.  not diaphoretic.  Psychiatric:   normal mood and affect.   behavior is normal. Judgment and thought content normal.   Mallampati Score:     Imaging: Dg Chest 2 View  Result Date: 08/14/2017 CLINICAL DATA:  Chest pain.  Shortness of  breath. EXAM: CHEST  2 VIEW COMPARISON:  06/18/2017 FINDINGS: Cardiomediastinal silhouette is normal. Mediastinal contours appear intact. Calcific atherosclerotic disease and tortuosity of the aorta. There is no evidence of focal airspace consolidation, pleural effusion or pneumothorax. Osseous structures are without acute abnormality. Soft tissues are grossly normal. IMPRESSION: Calcific atherosclerotic disease and tortuosity of the aorta. No evidence of pulmonary consolidation. Electronically Signed   By: Fidela Salisbury M.D.   On: 08/14/2017 20:07   Ir Radiologist Eval & Mgmt  Result Date: 09/04/2017 Please refer to notes tab for details about interventional procedure. (Op Note)   Labs:  CBC: Recent Labs    06/17/17 0703 06/18/17 1053 08/14/17 1933  WBC 9.7 9.6 9.6  HGB 14.2 14.8 14.8  HCT 43.5 45.8 43.3  PLT 189 182 205    COAGS: Recent Labs    06/17/17 0703 06/18/17 1053  INR 0.89 0.91  APTT 32  --     BMP: Recent Labs    06/17/17 0703 06/18/17 1053 06/28/17 08/14/17 1933  NA 138 137 139 137  K 4.0 4.5 4.5 4.1  CL 102 102  --  103  CO2 29 26  --  23  GLUCOSE 155* 131*  --  166*  BUN 25* 22* 29* 21*  CALCIUM 9.2 9.5  --  9.6  CREATININE 1.65* 1.38* 1.4* 1.51*  GFRNONAA 30* 37*  --  34*  GFRAA 35* 43*  --  39*    LIVER FUNCTION TESTS: Recent Labs    06/28/17  AST 16  ALT 12  ALKPHOS 100    TUMOR MARKERS: No results for input(s): AFPTM, CEA, CA199, CHROMGRNA in  the last 8760 hours.  Assessment and Plan:  My impression is that the patient did well after prophylactic IVC filter placement and tolerated her multilevel lumbar fusion surgery. Given her current level of activity I think it's appropriate to go ahead and retrieve the IVC filter to prevent long-term complications. She can remain on her aspirin therapy for retrievable. Reviewed with the patient the pathophysiology and concepts behind prophylactic caval filtration. We reviewed the benefits of  having filter for the surgery, and possible long-term complications and side effects of caval filtration. We discussed in detail the transjugular approach for IVC filter retrieval, with moderate sedation available. She seemed to understand and had her questions answered. She is motivated to proceed with IVC filter retrieval. We can set this up at her convenience as an outpatient with the option of moderate sedation at [facility] or [facility]. She may need to wait until after the holidays due to transportation issues with her family.  Thank you for this interesting consult.  I greatly enjoyed meeting Milina DELESIA MARTINEK and look forward to participating in their care.  A copy of this report was sent to the requesting provider on this date.  Electronically Signed: Rickard Rhymes 09/04/2017, 12:42 PM   I spent a total of    25 Minutes in face to face in clinical consultation, greater than 50% of which was counseling/coordinating care for planning IVC filter retrieval.

## 2017-09-23 ENCOUNTER — Other Ambulatory Visit: Payer: Self-pay | Admitting: Radiology

## 2017-09-25 ENCOUNTER — Encounter (HOSPITAL_COMMUNITY): Payer: Self-pay

## 2017-09-25 ENCOUNTER — Ambulatory Visit (HOSPITAL_COMMUNITY)
Admission: RE | Admit: 2017-09-25 | Discharge: 2017-09-25 | Disposition: A | Discharge status: Home / Self Care | Payer: Medicare HMO | Source: Ambulatory Visit | Attending: Interventional Radiology | Admitting: Interventional Radiology

## 2017-09-25 DIAGNOSIS — I2510 Atherosclerotic heart disease of native coronary artery without angina pectoris: Secondary | ICD-10-CM | POA: Diagnosis not present

## 2017-09-25 DIAGNOSIS — Z95828 Presence of other vascular implants and grafts: Secondary | ICD-10-CM | POA: Diagnosis not present

## 2017-09-25 DIAGNOSIS — Z79899 Other long term (current) drug therapy: Secondary | ICD-10-CM | POA: Insufficient documentation

## 2017-09-25 DIAGNOSIS — R69 Illness, unspecified: Secondary | ICD-10-CM | POA: Diagnosis not present

## 2017-09-25 DIAGNOSIS — Z452 Encounter for adjustment and management of vascular access device: Secondary | ICD-10-CM | POA: Diagnosis not present

## 2017-09-25 DIAGNOSIS — I509 Heart failure, unspecified: Secondary | ICD-10-CM | POA: Insufficient documentation

## 2017-09-25 DIAGNOSIS — I714 Abdominal aortic aneurysm, without rupture: Secondary | ICD-10-CM | POA: Diagnosis not present

## 2017-09-25 DIAGNOSIS — Z86718 Personal history of other venous thrombosis and embolism: Secondary | ICD-10-CM | POA: Diagnosis not present

## 2017-09-25 DIAGNOSIS — I110 Hypertensive heart disease with heart failure: Secondary | ICD-10-CM | POA: Diagnosis not present

## 2017-09-25 DIAGNOSIS — F419 Anxiety disorder, unspecified: Secondary | ICD-10-CM | POA: Insufficient documentation

## 2017-09-25 DIAGNOSIS — Z85828 Personal history of other malignant neoplasm of skin: Secondary | ICD-10-CM | POA: Insufficient documentation

## 2017-09-25 DIAGNOSIS — Z7984 Long term (current) use of oral hypoglycemic drugs: Secondary | ICD-10-CM | POA: Insufficient documentation

## 2017-09-25 DIAGNOSIS — E785 Hyperlipidemia, unspecified: Secondary | ICD-10-CM | POA: Diagnosis not present

## 2017-09-25 DIAGNOSIS — E1151 Type 2 diabetes mellitus with diabetic peripheral angiopathy without gangrene: Secondary | ICD-10-CM | POA: Diagnosis not present

## 2017-09-25 DIAGNOSIS — Z7982 Long term (current) use of aspirin: Secondary | ICD-10-CM | POA: Insufficient documentation

## 2017-09-25 DIAGNOSIS — Z8673 Personal history of transient ischemic attack (TIA), and cerebral infarction without residual deficits: Secondary | ICD-10-CM | POA: Diagnosis not present

## 2017-09-25 DIAGNOSIS — Z96653 Presence of artificial knee joint, bilateral: Secondary | ICD-10-CM | POA: Diagnosis not present

## 2017-09-25 DIAGNOSIS — F17210 Nicotine dependence, cigarettes, uncomplicated: Secondary | ICD-10-CM | POA: Insufficient documentation

## 2017-09-25 DIAGNOSIS — Z4589 Encounter for adjustment and management of other implanted devices: Secondary | ICD-10-CM | POA: Diagnosis not present

## 2017-09-25 DIAGNOSIS — J449 Chronic obstructive pulmonary disease, unspecified: Secondary | ICD-10-CM | POA: Insufficient documentation

## 2017-09-25 DIAGNOSIS — F329 Major depressive disorder, single episode, unspecified: Secondary | ICD-10-CM | POA: Insufficient documentation

## 2017-09-25 DIAGNOSIS — K219 Gastro-esophageal reflux disease without esophagitis: Secondary | ICD-10-CM | POA: Diagnosis not present

## 2017-09-25 HISTORY — PX: IR IVC FILTER RETRIEVAL / S&I /IMG GUID/MOD SED: IMG5308

## 2017-09-25 LAB — BASIC METABOLIC PANEL
ANION GAP: 10 (ref 5–15)
BUN: 25 mg/dL — ABNORMAL HIGH (ref 6–20)
CHLORIDE: 103 mmol/L (ref 101–111)
CO2: 26 mmol/L (ref 22–32)
Calcium: 9 mg/dL (ref 8.9–10.3)
Creatinine, Ser: 1.51 mg/dL — ABNORMAL HIGH (ref 0.44–1.00)
GFR calc non Af Amer: 34 mL/min — ABNORMAL LOW (ref >60)
GFR, EST AFRICAN AMERICAN: 39 mL/min — AB (ref >60)
Glucose, Bld: 163 mg/dL — ABNORMAL HIGH (ref 65–99)
POTASSIUM: 4 mmol/L (ref 3.5–5.1)
Sodium: 139 mmol/L (ref 135–145)

## 2017-09-25 LAB — CBC
HEMATOCRIT: 45.6 % (ref 36.0–46.0)
HEMOGLOBIN: 14.8 g/dL (ref 12.0–15.0)
MCH: 32.1 pg (ref 26.0–34.0)
MCHC: 32.5 g/dL (ref 30.0–36.0)
MCV: 98.9 fL (ref 78.0–100.0)
Platelets: 190 10*3/uL (ref 150–400)
RBC: 4.61 MIL/uL (ref 3.87–5.11)
RDW: 14.2 % (ref 11.5–15.5)
WBC: 8.7 10*3/uL (ref 4.0–10.5)

## 2017-09-25 LAB — PROTIME-INR
INR: 0.91
Prothrombin Time: 12.2 seconds (ref 11.4–15.2)

## 2017-09-25 LAB — APTT: aPTT: 30 seconds (ref 24–36)

## 2017-09-25 MED ORDER — IOPAMIDOL (ISOVUE-300) INJECTION 61%
INTRAVENOUS | Status: AC
  Administered 2017-09-25: 40 mL
  Filled 2017-09-25: qty 100

## 2017-09-25 MED ORDER — LIDOCAINE HCL (PF) 1 % IJ SOLN
INTRAMUSCULAR | Status: AC | PRN
  Administered 2017-09-25: 10 mL

## 2017-09-25 MED ORDER — HYDROCODONE-ACETAMINOPHEN 5-325 MG PO TABS
1.0000 | ORAL_TABLET | ORAL | Status: DC | PRN
  Administered 2017-09-25: 2 via ORAL

## 2017-09-25 MED ORDER — MIDAZOLAM HCL 2 MG/2ML IJ SOLN
INTRAMUSCULAR | Status: AC | PRN
  Administered 2017-09-25: 0.5 mg via INTRAVENOUS
  Administered 2017-09-25: 1 mg via INTRAVENOUS

## 2017-09-25 MED ORDER — LIDOCAINE HCL 1 % IJ SOLN
INTRAMUSCULAR | Status: AC
  Filled 2017-09-25: qty 20

## 2017-09-25 MED ORDER — FENTANYL CITRATE (PF) 100 MCG/2ML IJ SOLN
INTRAMUSCULAR | Status: AC | PRN
  Administered 2017-09-25: 50 ug via INTRAVENOUS

## 2017-09-25 MED ORDER — MIDAZOLAM HCL 2 MG/2ML IJ SOLN
INTRAMUSCULAR | Status: AC
  Filled 2017-09-25: qty 4

## 2017-09-25 MED ORDER — HYDROCODONE-ACETAMINOPHEN 5-325 MG PO TABS
ORAL_TABLET | ORAL | Status: AC
  Filled 2017-09-25: qty 2

## 2017-09-25 MED ORDER — FENTANYL CITRATE (PF) 100 MCG/2ML IJ SOLN
INTRAMUSCULAR | Status: AC
  Filled 2017-09-25: qty 4

## 2017-09-25 MED ORDER — SODIUM CHLORIDE 0.9 % IV SOLN: INTRAVENOUS | Status: DC

## 2017-09-25 NOTE — Sedation Documentation (Signed)
Patient denies pain and is resting comfortably.  

## 2017-09-25 NOTE — Sedation Documentation (Signed)
Patient is resting comfortably. 

## 2017-09-25 NOTE — Discharge Instructions (Addendum)
Inferior Vena Cava Filter Insertion, Care After (Retrieval) This sheet gives you information about how to care for yourself after your procedure. Your health care provider may also give you more specific instructions. If you have problems or questions, contact your health care provider. What can I expect after the procedure? After your procedure, it is common to have:  Mild pain in the area where the filter was inserted.  Mild bruising in the area where the filter was inserted.  Follow these instructions at home: Insertion site care  Follow instructions from your health care provider about how to take care of the site where a catheter was inserted at your neck or groin (insertion site). Make sure you: ? Wash your hands with soap and water before you change your bandage (dressing). If soap and water are not available, use hand sanitizer. ? Change your dressing as told by your health care provider.  Check your insertion site every day for signs of infection. Check for: ? More redness, swelling, or pain. ? More fluid or blood. ? Warmth. ? Pus or a bad smell.  Keep the insertion site clean and dry.  Do not shower, bathe, use a hot tub, or let the dressing get wet until your health care provider approves. General instructions  Take over-the-counter and prescription medicines only as told by your health care provider.  Avoid heavy lifting or hard activities for 48 hours after the procedure or as told by your health care provider.  Do not drive for 24 hours if you were given a a medicine to help you relax (sedative).  Do not drive or use heavy machinery while taking prescription pain medicine.  Do not go back to school or work until your health care provider approves.  Keep all follow-up visits as told by your health care provider. This is important. Contact a health care provider if:  You have more redness, swelling, or pain around your insertion site.  You have more fluid or  blood coming from your insertion site.  Your insertion site feels warm to the touch.  You have pus or a bad smell coming from your insertion site.  You have a fever.  You are dizzy.  You have nausea and vomiting.  You develop a rash. Get help right away if:  You develop chest pain, a cough, or difficulty breathing.  You develop shortness of breath, feel faint, or pass out.  You cough up blood.  You have severe pain in your abdomen.  You develop swelling and discoloration or pain in your legs.  Your legs become pale and cold or blue.  You develop weakness, difficulty moving your arms or legs, or balance problems.  You develop problems with speech or vision. These symptoms may represent a serious problem that is an emergency. Do not wait to see if the symptoms will go away. Get medical help right away. Call your local emergency services (911 in the [place] Do not drive yourself to the hospital. Summary  After your insertion procedure, it is common to have mild pain and bruising.  Do not shower, bathe, use a hot tub, or let the dressing get wet until your health care provider approves.  Every day, check for signs of infection where a catheter was inserted at your neck or groin (insertion site). This information is not intended to replace advice given to you by your health care provider. Make sure you discuss any questions you have with your health care provider. Document Released: 07/08/2013  Document Revised: 08/08/2016 Document Reviewed: 08/08/2016 Elsevier Interactive Patient Education  2017 [place] Interactive Patient Education  2018 [employer].   Moderate Conscious Sedation, Adult, Care After These instructions provide you with information about caring for yourself after your procedure. Your health care provider may also give you more specific instructions. Your treatment has been planned according to current medical practices, but problems sometimes  occur. Call your health care provider if you have any problems or questions after your procedure. What can I expect after the procedure? After your procedure, it is common:  To feel sleepy for several hours.  To feel clumsy and have poor balance for several hours.  To have poor judgment for several hours.  To vomit if you eat too soon.  Follow these instructions at home: For at least 24 hours after the procedure:   Do not: ? Participate in activities where you could fall or become injured. ? Drive. ? Use heavy machinery. ? Drink alcohol. ? Take sleeping pills or medicines that cause drowsiness. ? Make important decisions or sign legal documents. ? Take care of children on your own.  Rest. Eating and drinking  Follow the diet recommended by your health care provider.  If you vomit: ? Drink water, juice, or soup when you can drink without vomiting. ? Make sure you have little or no nausea before eating solid foods. General instructions  Have a responsible adult stay with you until you are awake and alert.  Take over-the-counter and prescription medicines only as told by your health care provider.  If you smoke, do not smoke without supervision.  Keep all follow-up visits as told by your health care provider. This is important. Contact a health care provider if:  You keep feeling nauseous or you keep vomiting.  You feel light-headed.  You develop a rash.  You have a fever. Get help right away if:  You have trouble breathing. This information is not intended to replace advice given to you by your health care provider. Make sure you discuss any questions you have with your health care provider. Document Released: 07/08/2013 Document Revised: 02/20/2016 Document Reviewed: 01/07/2016 Elsevier Interactive Patient Education  [employer].

## 2017-09-25 NOTE — H&P (Signed)
Chief Complaint: Patient was seen in consultation today for retrieval inferior vena cava filter  at the request of Dr Sherley Bounds  Referring Physician(s): Dr Coralie Common  Supervising Physician: Arne Cleveland  Patient Status: [facility] - Out-pt  History of Present Illness: Jocelyn Curtis is a 71 y.o. female   Dr Vernard Gambles note 09/04/17:  Placement of a retrievable IVC filter preoperatively. She had a history of previous lower extremity DVT post total knee replacement and plans for posterior post multilevel posterior spinal fusion surgery 06/20/2017. The filter was placed 06/17/2017 uneventfully.Ultrasound today shows no lower extremity DVT. She's on aspirin.. My impression is that the patient did well after prophylactic IVC filter placement and tolerated her multilevel lumbar fusion surgery. Given her current level of activity I think it's appropriate to go ahead and retrieve the IVC filter to prevent long-term complications.   Now scheduled for IVC filter retrieval  Past Medical History:  Diagnosis Date  . AAA (abdominal aortic aneurysm) ([place])   . AAA (abdominal aortic aneurysm) ([place])    followed by dr Jeralene Peters for appt  . Abdominal aortic aneurysm without rupture [facility])    followed by Dr. Concepcion Elk  . Anxiety and depression   . Arthritis    "all over body"  . Blood transfusion 37yrs ago  . Bruises easily   . Cancer ([place])    HX SKIN CA  . Carotid artery occlusion   . Cerebrovascular disease    pt states she pulls to the left and staggers alot  . CHF (congestive heart failure) ([place])    USED TO SEE DR GROVE- not seen cardiac dr out of hospital since 2013  . Chronic back pain   . Complication of anesthesia    ENDED WITH BLOOD CLOTT IN LEG- "takes more anesthesia for me"  . COPD (chronic obstructive pulmonary disease) ([place])   . Coronary artery disease   . Depression    takes Paxil daily  . Diabetes mellitus    type II  . Dizziness   . Dizziness - giddy   . Dysrhythmia      -atrial fib hx no problems now  . Foot drop    right since knee replacement  . GERD (gastroesophageal reflux disease)    takes Protonix daily  . H/O blood clots    in right foot-04/24/11  . H/O: GI bleed   . History of bladder infections   . Hyperlipidemia    takes Pravastatin daily  . Hypertension    takes  Lisinopril daily, Dr. Ricci Barker. took care of pt. before his retirement   . Osteoarthritis   . Osteoarthritis (arthritis due to wear and tear of joints)   . Peripheral vascular disease ([place])    blood clot in foot following epidural anesthesia - 04/2011  . Pneumonia    hx of;last time about 43yrs ago 2008  . Renal insufficiency    SEE MED DR Welton Flakes [place]  . Shortness of breath    with exertion  . Stroke [facility]    ministrokes many yrs. ago   . Subclavian steal syndrome   . Subclavian steal syndrome   . Vision loss    left eye    Past Surgical History:  Procedure Laterality Date  . APPENDECTOMY    . CARDIAC CATHETERIZATION    . CAROTID-SUBCLAVIAN BYPASS GRAFT  05/02/2012   Procedure: BYPASS GRAFT CAROTID-SUBCLAVIAN;  Surgeon: Mal Misty, MD;  Location: [place];  Service: Vascular;  Laterality: Left;  .  CARPAL TUNNEL RELEASE Left   . COSMETIC SURGERY     face lift  . ENDARTERECTOMY  05/02/2012   Procedure: ENDARTERECTOMY SUBCLAVIAN;  Surgeon: Mal Misty, MD;  Location: [place];  Service: Vascular;  Laterality: Left;  . EYE SURGERY Bilateral    cataracts  . FOOT SURGERY Left    left-hammer toes  . HAND SURGERY     right  joints  . IR IVC FILTER PLMT / S&I /IMG GUID/MOD SED  06/17/2017  . IR RADIOLOGIST EVAL & MGMT  06/12/2017  . IR RADIOLOGIST EVAL & MGMT  09/04/2017  . IR US GUIDE VASC ACCESS RIGHT  06/17/2017  . JOINT REPLACEMENT     bilateral total knee replacement  . LUMBAR LAMINECTOMY/DECOMPRESSION MICRODISCECTOMY N/A 06/20/2017   Procedure: Thoracic 12 Lumbar 1, Lumabr Two-Lumbar Three LAMINECTOMY/FORAMINOTOMY;  Surgeon: Eustace Moore, MD;  Location:  [place];  Service: Neurosurgery;  Laterality: N/A;  Thoracic 12 Lumbar 1, Lumabr Two-Lumbar Three LAMINECTOMY/FORAMINOTOMY   . NASAL SINUS SURGERY    . perineal abscess I & D     necrotizing soft tissue infection 2005  . ROTATOR CUFF REPAIR     bilateral  . VAGINAL HYSTERECTOMY      Allergies: Lisinopril; Metformin and related; Nsaids; Penicillins; and Codeine  Medications: Prior to Admission medications   Medication Sig Start Date End Date Taking? Authorizing Provider  aspirin 81 MG tablet Chew 81 mg by mouth daily.   Yes [provider]  atorvastatin (LIPITOR) 80 MG tablet Take 80 mg by mouth every morning.    Yes [provider]  eszopiclone (LUNESTA) 2 MG TABS tablet Take 1 tablet by mouth at bedtime. 09/03/17  Yes [provider]  furosemide (LASIX) 40 MG tablet Take 1 tablet by mouth daily. 09/03/17  Yes [provider]  glimepiride (AMARYL) 4 MG tablet Take 8 mg by mouth daily with breakfast.    Yes [provider]  oxyCODONE-acetaminophen (PERCOCET/ROXICET) 5-325 MG tablet Take 1 tablet by mouth every 4 (four) hours as needed for moderate pain. 06/27/17  Yes Eulas Post, Monica, DO  pantoprazole (PROTONIX) 40 MG tablet Take 40 mg by mouth daily.   Yes [provider]  PARoxetine (PAXIL) 30 MG tablet Take 1 tablet by mouth daily. 08/16/17  Yes [provider]     Family History  Problem Relation Age of Onset  . Diabetes Mother   . Heart disease Mother        before age 104  . Hyperlipidemia Mother   . Hypertension Mother   . Heart attack Mother   . Cancer Father   . Heart disease Father        before age 77  . Hyperlipidemia Father   . Hypertension Father   . Heart attack Father   . Deep vein thrombosis Sister   . Diabetes Sister   . Heart disease Sister   . Hyperlipidemia Sister   . Hypertension Sister   . Heart attack Sister   . Diabetes Brother   . Hypertension Brother   . Diabetes Daughter   . Anesthesia  problems Neg Hx   . Hypotension Neg Hx   . Malignant hyperthermia Neg Hx   . Pseudochol deficiency Neg Hx     Social History   Socioeconomic History  . Marital status: Widowed    Spouse name: None  . Number of children: None  . Years of education: None  . Highest education level: None  Social Needs  . Financial  resource strain: None  . Food insecurity - worry: None  . Food insecurity - inability: None  . Transportation needs - medical: None  . Transportation needs - non-medical: None  Occupational History  . None  Tobacco Use  . Smoking status: Current Some Day Smoker    Packs/day: 0.50    Years: 48.00    Pack years: 24.00    Types: Cigarettes  . Smokeless tobacco: Former Systems developer    Types: Snuff    Quit date: 06/18/2016  . Tobacco comment: uses snuff every once in a while  Substance and Sexual Activity  . Alcohol use: Yes    Alcohol/week: 0.0 oz    Comment: occ beer  . Drug use: No  . Sexual activity: Yes    Birth control/protection: Surgical  Other Topics Concern  . None  Social History Narrative  . None     Review of Systems: A 12 point ROS discussed and pertinent positives are indicated in the HPI above.  All other systems are negative.  Review of Systems  Constitutional: Positive for activity change and fatigue. Negative for fever.  Respiratory: Negative for shortness of breath.   Cardiovascular: Negative for chest pain.  Musculoskeletal: Positive for back pain and gait problem.  Neurological: Positive for weakness.  Psychiatric/Behavioral: Negative for behavioral problems and confusion.    Vital Signs: BP 124/68   Pulse 76   Temp 97.7 F (36.5 C) (Oral)   Resp 18   Ht 5\' 8"  (1.727 m)   Wt 200 lb (90.7 kg)   SpO2 98%   BMI 30.41 kg/m   Physical Exam  Constitutional: She is oriented to person, place, and time.  Cardiovascular: Normal rate and regular rhythm.  Pulmonary/Chest: Effort normal and breath sounds normal.  Abdominal: Soft. Bowel sounds  are normal.  Musculoskeletal: Normal range of motion.  Neurological: She is alert and oriented to person, place, and time.  Skin: Skin is warm and dry.  Psychiatric: She has a normal mood and affect. Her behavior is normal. Judgment and thought content normal.  Nursing note and vitals reviewed.   Imaging: US Venous Img Lower Bilateral  Result Date: 09/04/2017 CLINICAL DATA:  Retrievable IVC filter placed for PE prophylaxis during periProcedural period for multilevel lumbar spine fusion surgery. Patient is now postop, did well, and presents for discussion regarding filter retrieval. EXAM: BILATERAL LOWER EXTREMITY VENOUS DOPPLER ULTRASOUND TECHNIQUE: Gray-scale sonography with compression, as well as color and duplex ultrasound, were performed to evaluate the deep venous system from the level of the common femoral vein through the popliteal and proximal calf veins. COMPARISON:  None FINDINGS: Normal compressibility of the common femoral, superficial femoral, and popliteal veins, as well as the proximal calf veins. No filling defects to suggest DVT on grayscale or color Doppler imaging. Doppler waveforms show normal direction of venous flow, normal respiratory phasicity and response to augmentation. Visualized segments of the saphenous venous systemS normal in caliber and compressibility. IMPRESSION: No evidence of  lower extremity deep vein thrombosis, bilaterally. Electronically Signed   By: Lucrezia Europe M.D.   On: 09/04/2017 13:38   Ir Radiologist Eval & Mgmt  Result Date: 09/04/2017 Please refer to notes tab for details about interventional procedure. (Op Note)   Labs:  CBC: Recent Labs    06/17/17 0703 06/18/17 1053 08/14/17 1933 09/25/17 0745  WBC 9.7 9.6 9.6 8.7  HGB 14.2 14.8 14.8 14.8  HCT 43.5 45.8 43.3 45.6  PLT 189 182 205 190    COAGS:  Recent Labs    06/17/17 0703 06/18/17 1053 09/25/17 0745  INR 0.89 0.91 0.91  APTT 32  --  30    BMP: Recent Labs     06/17/17 0703 06/18/17 1053 06/28/17 08/14/17 1933 09/25/17 0745  NA 138 137 139 137 139  K 4.0 4.5 4.5 4.1 4.0  CL 102 102  --  103 103  CO2 29 26  --  23 26  GLUCOSE 155* 131*  --  166* 163*  BUN 25* 22* 29* 21* 25*  CALCIUM 9.2 9.5  --  9.6 9.0  CREATININE 1.65* 1.38* 1.4* 1.51* 1.51*  GFRNONAA 30* 37*  --  34* 34*  GFRAA 35* 43*  --  39* 39*    LIVER FUNCTION TESTS: Recent Labs    06/28/17  AST 16  ALT 12  ALKPHOS 100    TUMOR MARKERS: No results for input(s): AFPTM, CEA, CA199, CHROMGRNA in the last 8760 hours.  Assessment and Plan:  IVC filter placed preoperatively 06/17/17 Surgery 06/20/17 Doing well US shows no DVT Bilaterally 09/04/17 scheduled for retrieval today Pt is aware of risks and benefits including but not limited to Infection; bleeding; vessel damage Agreeable to proceed Consent signed andin chart   Thank you for this interesting consult.  I greatly enjoyed meeting Tomara ZURRI RUDDEN and look forward to participating in their care.  A copy of this report was sent to the requesting provider on this date.  Electronically Signed: Lavonia Drafts, PA-C 09/25/2017, 8:21 AM   I spent a total of    25 Minutes in face to face in clinical consultation, greater than 50% of which was counseling/coordinating care for IVC filter removal

## 2017-09-25 NOTE — Procedures (Signed)
  Procedure: IVC gram and filter retrieval   Preprocedure diagnosis: DVT Postprocedure diagnosis: same EBL:   minimal Complications:  none immediate  See full dictation in [facility].  Dillard Cannon MD Main # [phone] Pager  [phone]

## 2017-12-04 DIAGNOSIS — I10 Essential (primary) hypertension: Secondary | ICD-10-CM | POA: Diagnosis not present

## 2017-12-04 DIAGNOSIS — E119 Type 2 diabetes mellitus without complications: Secondary | ICD-10-CM | POA: Diagnosis not present

## 2017-12-04 DIAGNOSIS — G8929 Other chronic pain: Secondary | ICD-10-CM | POA: Diagnosis not present

## 2017-12-04 DIAGNOSIS — G629 Polyneuropathy, unspecified: Secondary | ICD-10-CM | POA: Diagnosis not present

## 2018-01-27 DIAGNOSIS — M2042 Other hammer toe(s) (acquired), left foot: Secondary | ICD-10-CM | POA: Diagnosis not present

## 2018-01-27 DIAGNOSIS — M79672 Pain in left foot: Secondary | ICD-10-CM | POA: Diagnosis not present

## 2018-01-27 DIAGNOSIS — E11621 Type 2 diabetes mellitus with foot ulcer: Secondary | ICD-10-CM | POA: Diagnosis not present

## 2018-01-27 DIAGNOSIS — I890 Lymphedema, not elsewhere classified: Secondary | ICD-10-CM | POA: Diagnosis not present

## 2018-03-05 DIAGNOSIS — G8929 Other chronic pain: Secondary | ICD-10-CM | POA: Diagnosis not present

## 2018-03-05 DIAGNOSIS — N189 Chronic kidney disease, unspecified: Secondary | ICD-10-CM | POA: Diagnosis not present

## 2018-03-17 IMAGING — CR DG CHEST 2V
2 series · 2 of 2 positions shown · non-contrast
Comparison: 08/18/2014

CLINICAL DATA: Preop evaluation for upcoming lumbar surgery,
initial encounter

EXAM:
CHEST  2 VIEW

[w chest pa]
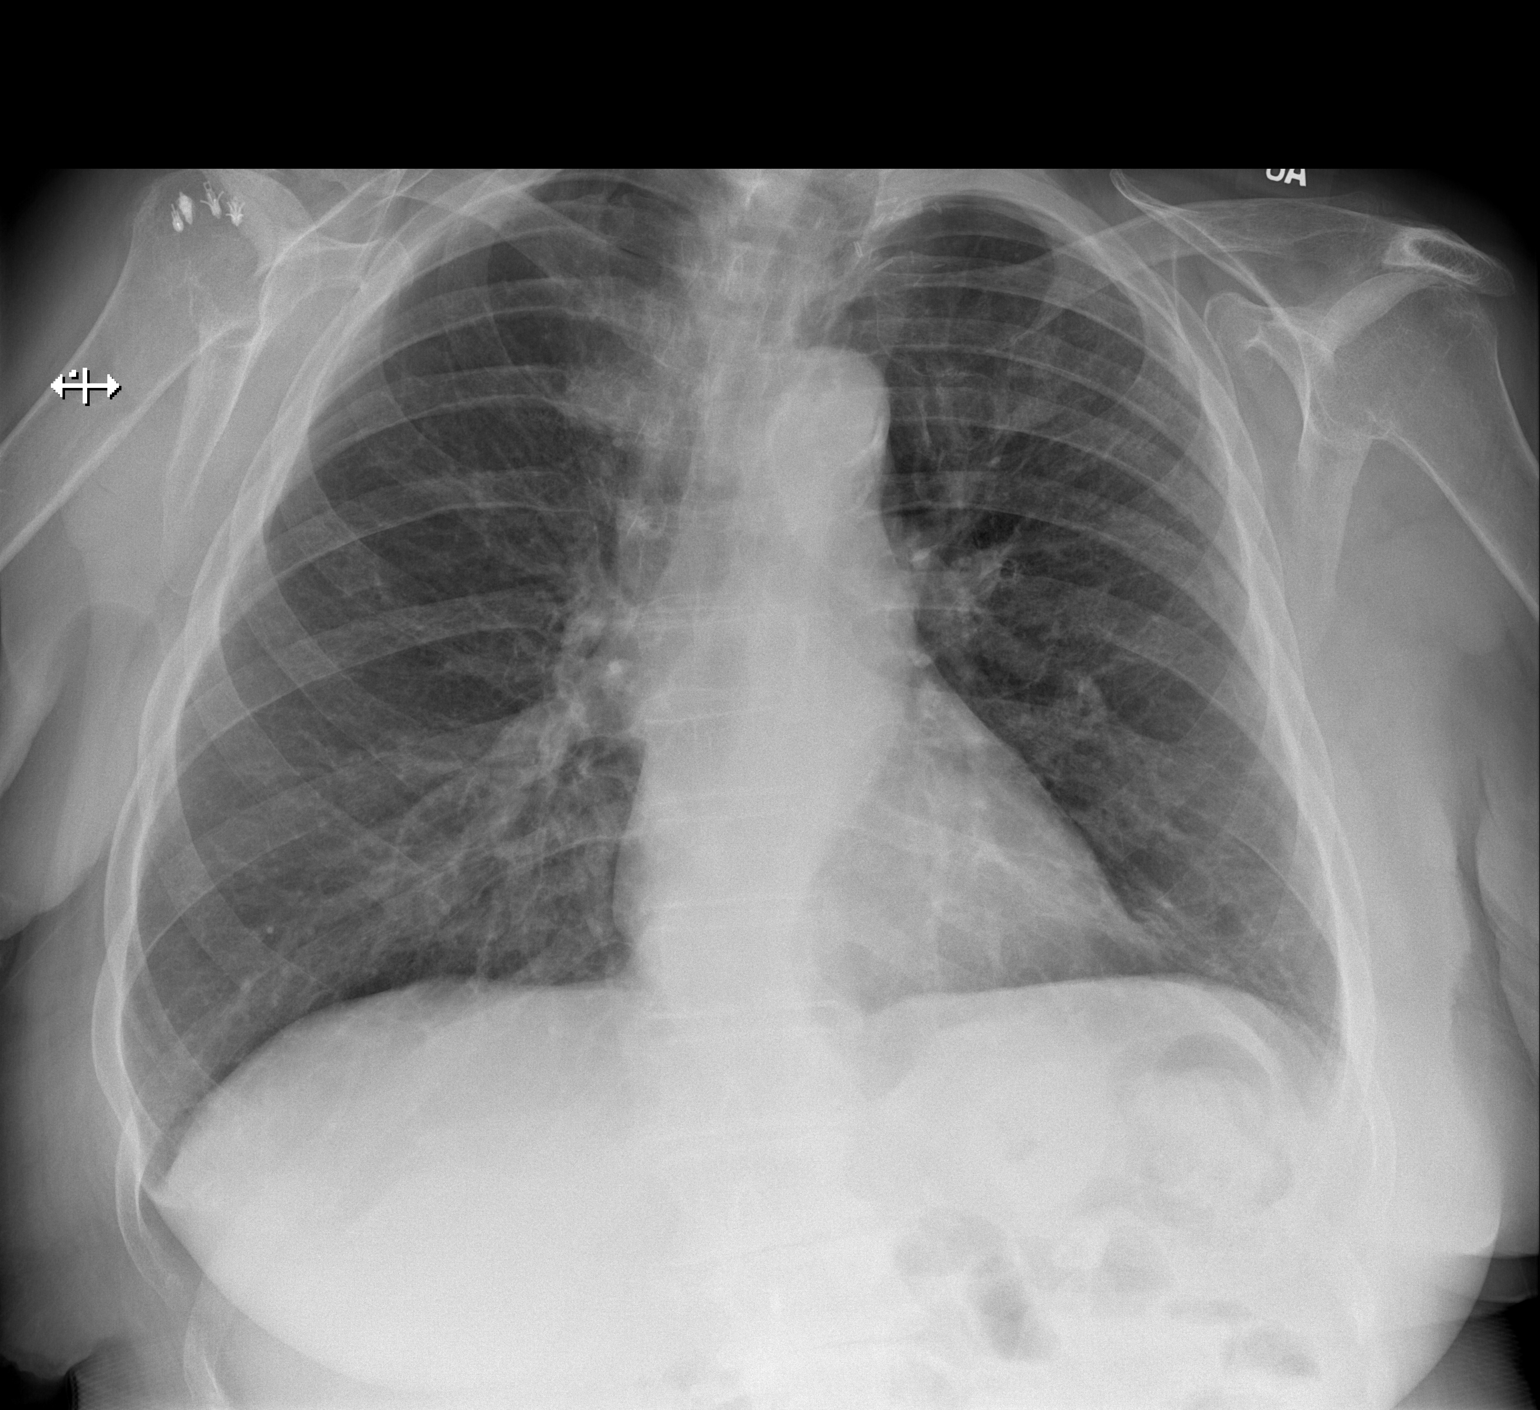

[w chest lat]
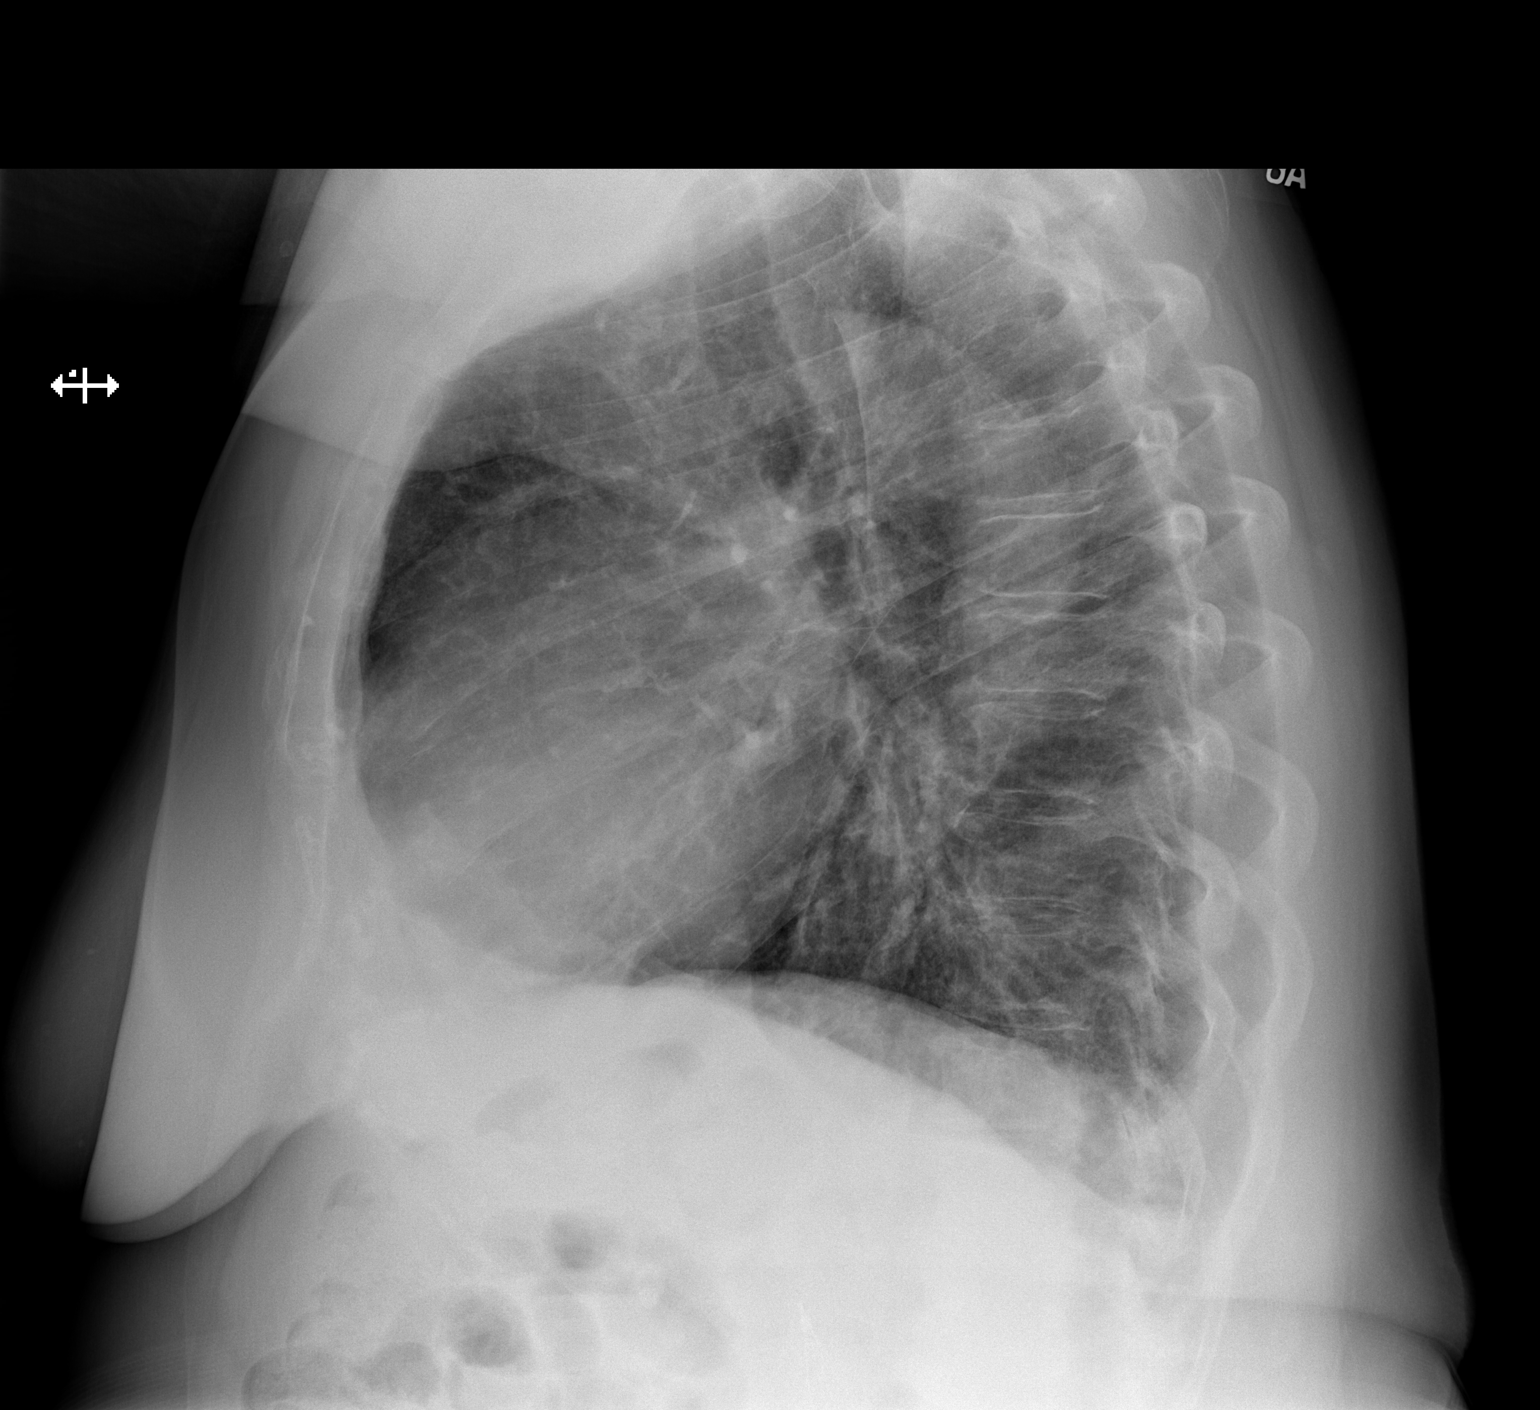

[2 of 2 positions shown; findings below may reference images not displayed]

FINDINGS: Cardiac shadow is within normal limits. The lungs are well aerated
bilaterally. Mild scoliosis concave to the left is noted in the
upper thoracic spine. Some accentuation of the mediastinal markings
is noted related to patient rotation and scoliosis. These are stable
in appearance from prior exam. No acute abnormality is noted.
IMPRESSION: No active cardiopulmonary disease.

## 2018-05-13 DIAGNOSIS — G8929 Other chronic pain: Secondary | ICD-10-CM | POA: Diagnosis not present

## 2018-06-11 DIAGNOSIS — G8929 Other chronic pain: Secondary | ICD-10-CM | POA: Diagnosis not present

## 2018-06-11 DIAGNOSIS — E119 Type 2 diabetes mellitus without complications: Secondary | ICD-10-CM | POA: Diagnosis not present

## 2018-06-24 IMAGING — XA IR IVC FILTER RETRIEVAL / S&I /IMG GUID/MOD SED
2 series · 13 of 16 positions shown · IV contrast (IODINE)
Comparison: 06/17/2017

INDICATION: History of postop DVT. Prophylactic IVC filter placed 06/17/2017 in
preparation for multilevel lumbar fusion surgery, which went well.
No filter related complications. Retrieval requested. See previous
consultation.

EXAM:
1. IR IVC FILTER RETRIEVAL/S+I/ IMAGE GUIDE MODERATE SEDATION
2. IR ULTRASOUND GUIDANCE VASC ACCESS
TECHNIQUE: Informed written consent was obtained from the patient after a
discussion of the risks, benefits and alternatives to treatment.
Questions regarding the procedure were encouraged and answered. A
timeout was performed prior to the initiation of the procedure.

[Series 1: body 4 care · 6 of 8 slices shown (1 of 2)]
[im 1/8]
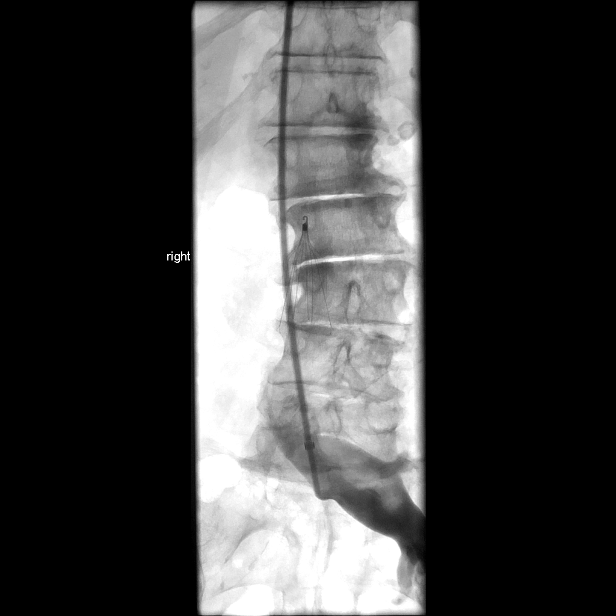
[im 2/8]
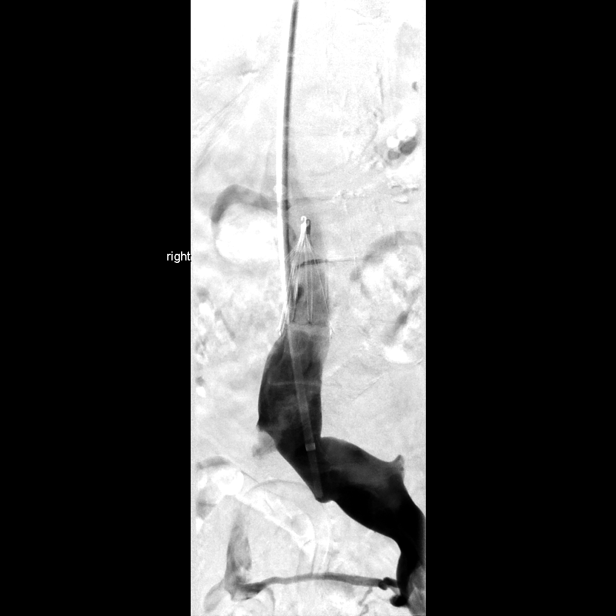
[im 4/8]
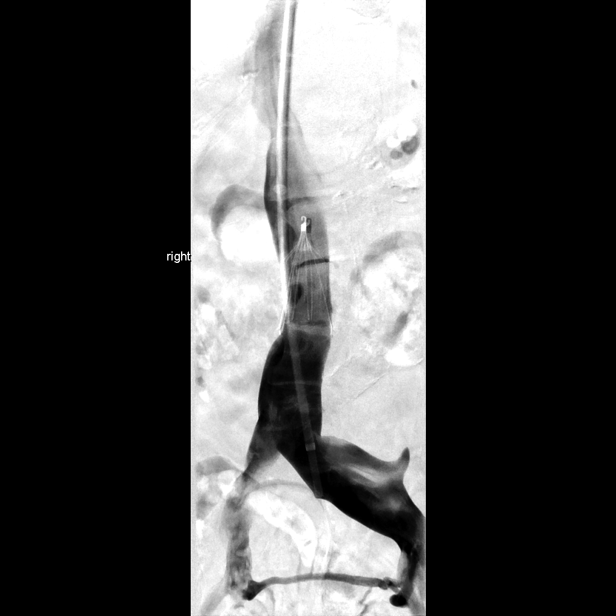
[im 5/8]
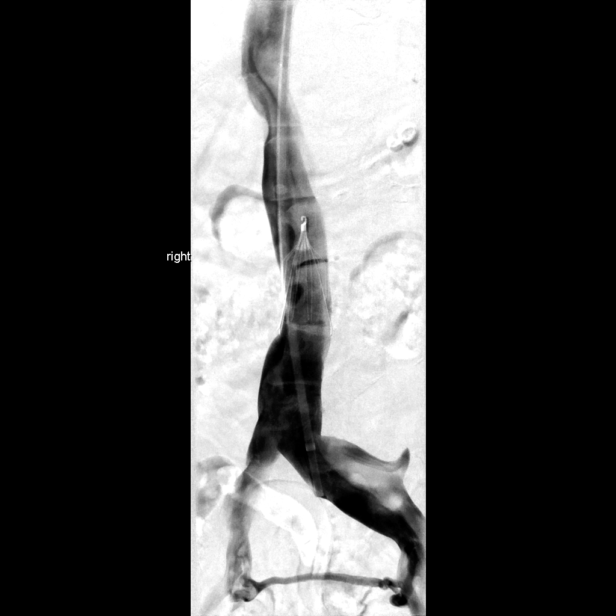
[im 6/8]
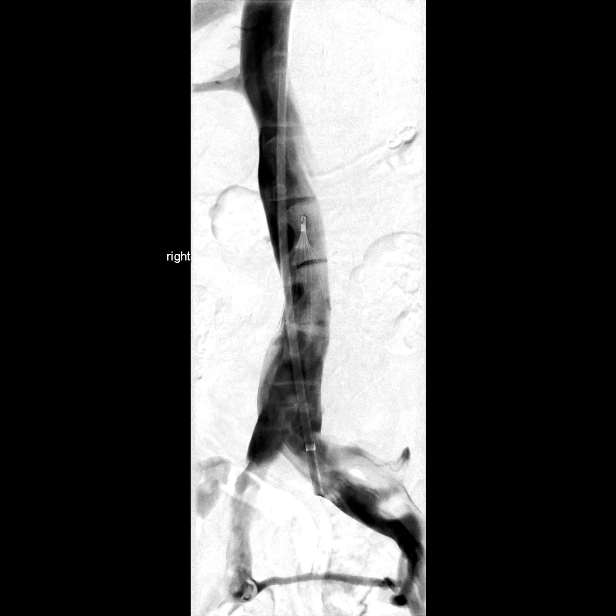
[im 7/8]
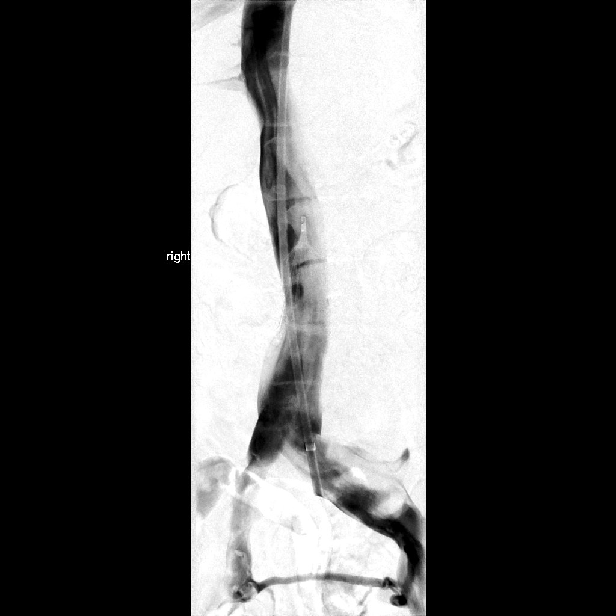

[Series 2: body 4 care · 7 of 8 slices shown (2 of 2)]
[im 1/8]
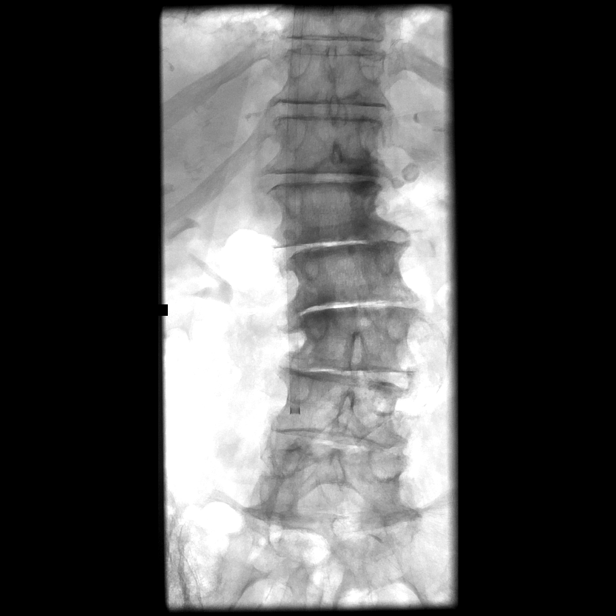
[im 2/8]
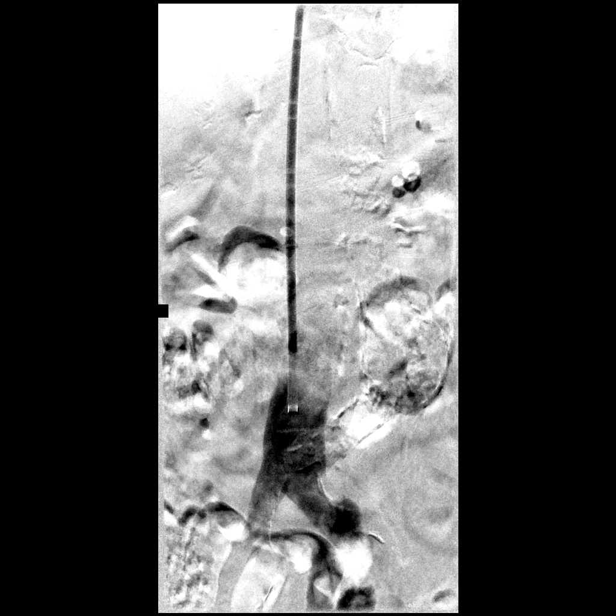
[im 3/8]
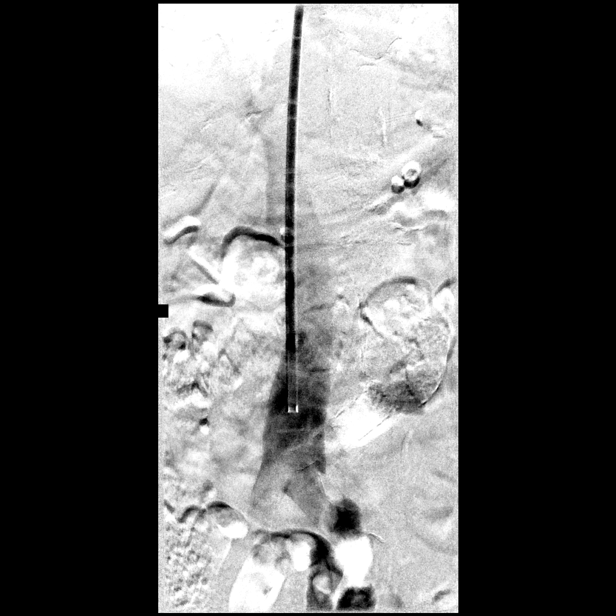
[im 4/8]
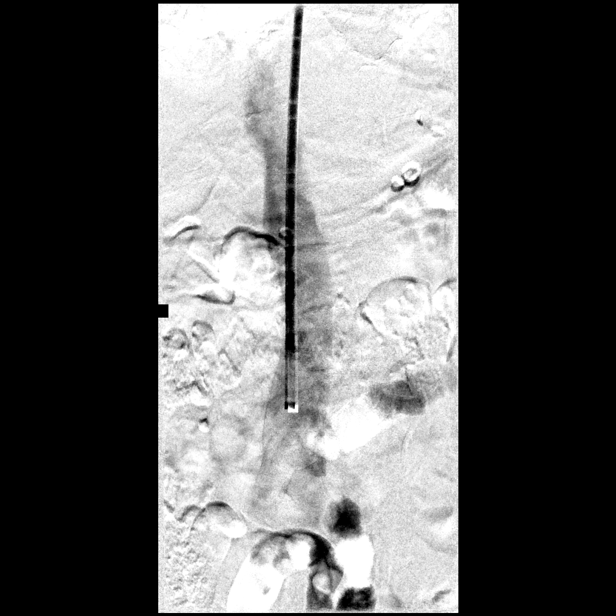
[im 5/8]
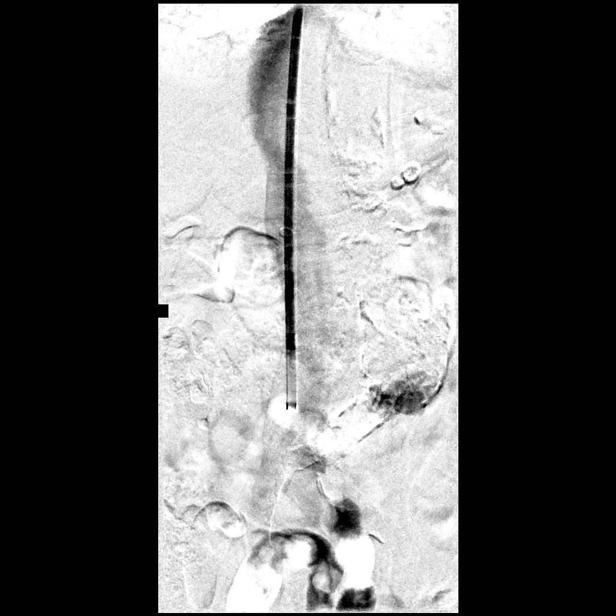
[im 7/8]
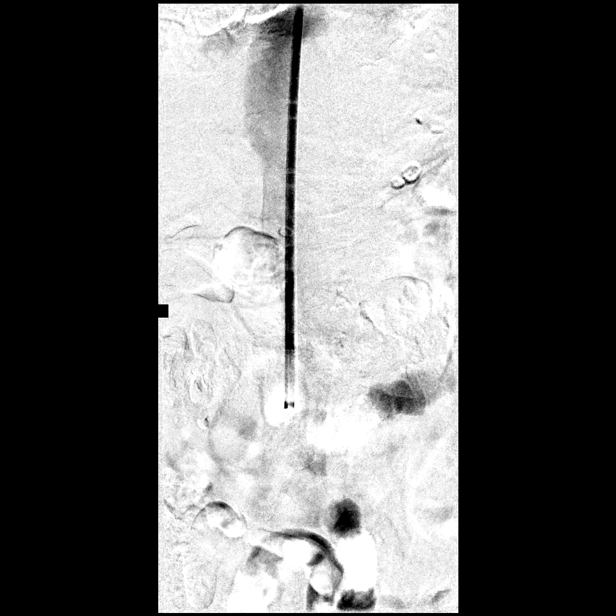
[im 8/8]
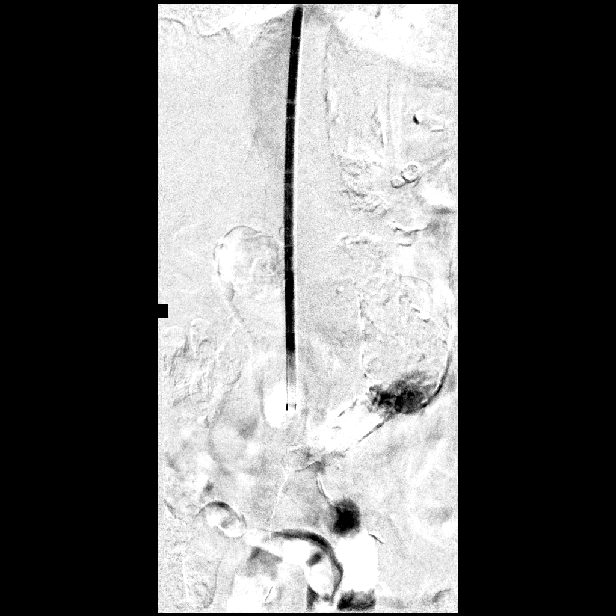

[13 of 16 positions shown; findings below may reference images not displayed]

ANESTHESIA/SEDATION:
Intravenous Fentanyl and Versed were administered as conscious
sedation during continuous monitoring of the patient's level of
consciousness and physiological / cardiorespiratory status by the
radiology RN, with a total moderate sedation time of 10 minutes.
The right neck was prepped and draped in the usual sterile fashion,
and a sterile drape was applied covering the operative field.
Maximum barrier sterile technique with sterile gowns and gloves were
used for the procedure. A timeout was performed prior to the
initiation of the procedure. Local anesthesia was provided with 1%
lidocaine.

Under direct ultrasound guidance, the right internal jugular vein
was accessed with a micropuncture needle at the overlying soft
tissues were anesthetized with 1% lidocaine. An ultrasound image was
saved for documentation purposes. This allowed for placement of a
5-French vascular sheath. With the use of a Benson wire, a pigtail
catheter was advanced to the level of the caudal IVC and an inferior
venacavagram was performed.

The vascular sheath was exchanged for an 11 [employer] Gaioso IVC filter
retrieval sheath. The hook of the filter was successfully snared and
the filter was withdrawn intact into the co-axial 11-French sheath.
A completion inferior venacavagram was performed.

At this point, the procedure was terminated. All wires, catheters
and sheaths were removed from the patient. Hemostasis was achieved
at the right neck access site with manual compression. A dressing
was placed. The patient tolerated the above procedure well without
immediate postprocedural complication.
FINDINGS: Inferior venacavagram demonstrates wide patency of the IVC. There is
minimal residual thrombus within the apex of the infrarenal IVC
filter which appears unchanged in position from the time of initial
placement.

The IVC filter was successfully removed using a snare device as
detailed above.

Completion inferior venacavagram was negative for caval injury.
IMPRESSION: 1. Successful fluoroscopic guided removal of infrarenal IVC filter.

2. Normal inferior venacavagram.

## 2018-07-09 DIAGNOSIS — G8929 Other chronic pain: Secondary | ICD-10-CM | POA: Diagnosis not present

## 2018-08-11 DIAGNOSIS — G8929 Other chronic pain: Secondary | ICD-10-CM | POA: Diagnosis not present

## 2018-09-10 DIAGNOSIS — Z23 Encounter for immunization: Secondary | ICD-10-CM | POA: Diagnosis not present

## 2018-09-10 DIAGNOSIS — C44629 Squamous cell carcinoma of skin of left upper limb, including shoulder: Secondary | ICD-10-CM | POA: Diagnosis not present

## 2018-09-10 DIAGNOSIS — E119 Type 2 diabetes mellitus without complications: Secondary | ICD-10-CM | POA: Diagnosis not present

## 2018-09-10 DIAGNOSIS — D485 Neoplasm of uncertain behavior of skin: Secondary | ICD-10-CM | POA: Diagnosis not present

## 2018-09-10 DIAGNOSIS — G8929 Other chronic pain: Secondary | ICD-10-CM | POA: Diagnosis not present

## 2018-09-10 DIAGNOSIS — L988 Other specified disorders of the skin and subcutaneous tissue: Secondary | ICD-10-CM | POA: Diagnosis not present

## 2018-10-10 DIAGNOSIS — G8929 Other chronic pain: Secondary | ICD-10-CM | POA: Diagnosis not present

## 2018-11-10 DIAGNOSIS — G8929 Other chronic pain: Secondary | ICD-10-CM | POA: Diagnosis not present

## 2018-12-08 DIAGNOSIS — I10 Essential (primary) hypertension: Secondary | ICD-10-CM | POA: Diagnosis not present

## 2018-12-08 DIAGNOSIS — Z79899 Other long term (current) drug therapy: Secondary | ICD-10-CM | POA: Diagnosis not present

## 2018-12-08 DIAGNOSIS — G8929 Other chronic pain: Secondary | ICD-10-CM | POA: Diagnosis not present

## 2018-12-08 DIAGNOSIS — E119 Type 2 diabetes mellitus without complications: Secondary | ICD-10-CM | POA: Diagnosis not present

## 2019-01-12 DIAGNOSIS — G8929 Other chronic pain: Secondary | ICD-10-CM | POA: Diagnosis not present

## 2019-02-04 DIAGNOSIS — G8929 Other chronic pain: Secondary | ICD-10-CM | POA: Diagnosis not present

## 2019-03-04 DIAGNOSIS — G8929 Other chronic pain: Secondary | ICD-10-CM | POA: Diagnosis not present

## 2019-03-04 DIAGNOSIS — E119 Type 2 diabetes mellitus without complications: Secondary | ICD-10-CM | POA: Diagnosis not present

## 2019-04-09 DIAGNOSIS — G8929 Other chronic pain: Secondary | ICD-10-CM | POA: Diagnosis not present

## 2019-04-25 DIAGNOSIS — Z1231 Encounter for screening mammogram for malignant neoplasm of breast: Secondary | ICD-10-CM | POA: Diagnosis not present

## 2019-05-11 DIAGNOSIS — G8929 Other chronic pain: Secondary | ICD-10-CM | POA: Diagnosis not present

## 2019-06-09 DIAGNOSIS — G8929 Other chronic pain: Secondary | ICD-10-CM | POA: Diagnosis not present

## 2019-07-13 DIAGNOSIS — E119 Type 2 diabetes mellitus without complications: Secondary | ICD-10-CM | POA: Diagnosis not present

## 2019-07-13 DIAGNOSIS — G8929 Other chronic pain: Secondary | ICD-10-CM | POA: Diagnosis not present

## 2019-07-13 DIAGNOSIS — Z23 Encounter for immunization: Secondary | ICD-10-CM | POA: Diagnosis not present

## 2019-08-12 DIAGNOSIS — G8929 Other chronic pain: Secondary | ICD-10-CM | POA: Diagnosis not present

## 2019-09-07 DIAGNOSIS — G8929 Other chronic pain: Secondary | ICD-10-CM | POA: Diagnosis not present

## 2019-09-09 DIAGNOSIS — E119 Type 2 diabetes mellitus without complications: Secondary | ICD-10-CM | POA: Diagnosis not present

## 2019-10-03 IMAGING — US US EXTREM LOW VENOUS BILAT
1 series · 14 of 24 positions shown · non-contrast
Comparison: None

CLINICAL DATA: Retrievable IVC filter placed for PE prophylaxis
during periProcedural period for multilevel lumbar spine fusion
surgery. Patient is now postop, did well, and presents for
discussion regarding filter retrieval.

EXAM:
BILATERAL LOWER EXTREMITY VENOUS DOPPLER ULTRASOUND
TECHNIQUE: Gray-scale sonography with compression, as well as color and duplex
ultrasound, were performed to evaluate the deep venous system from
the level of the common femoral vein through the popliteal and
proximal calf veins.

[Series 1: us extrem low venous bilat · 0.07mm/px · 14 of 57 slices shown]
[im 1/57]
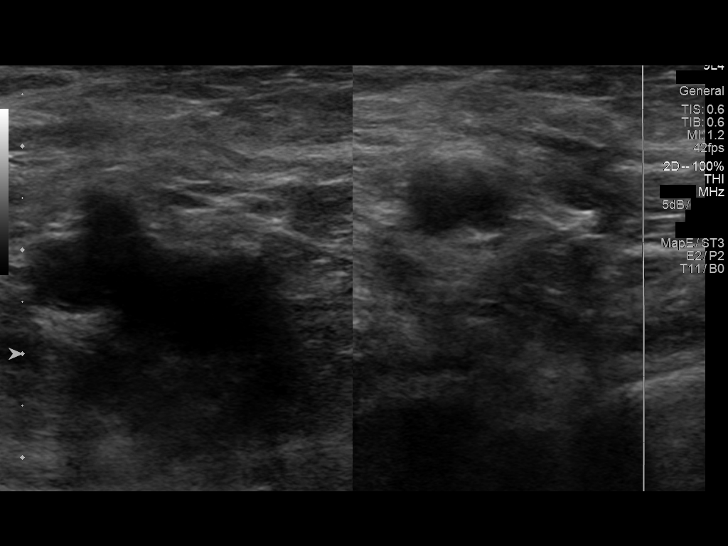
[im 5/57]
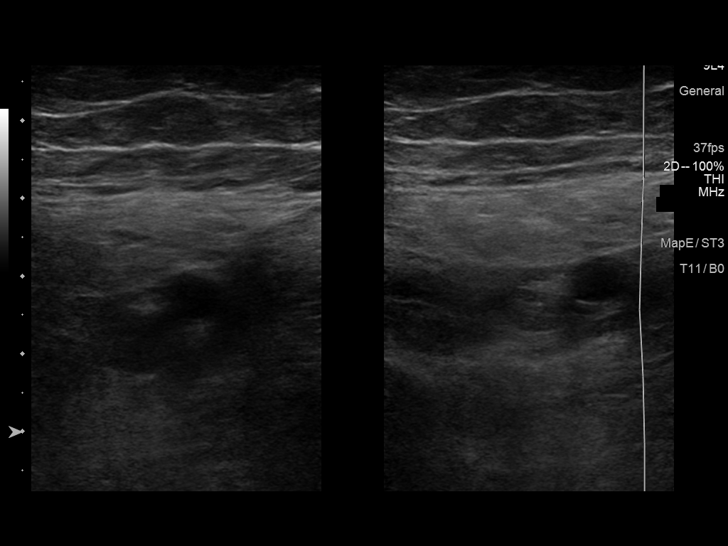
[im 10/57]
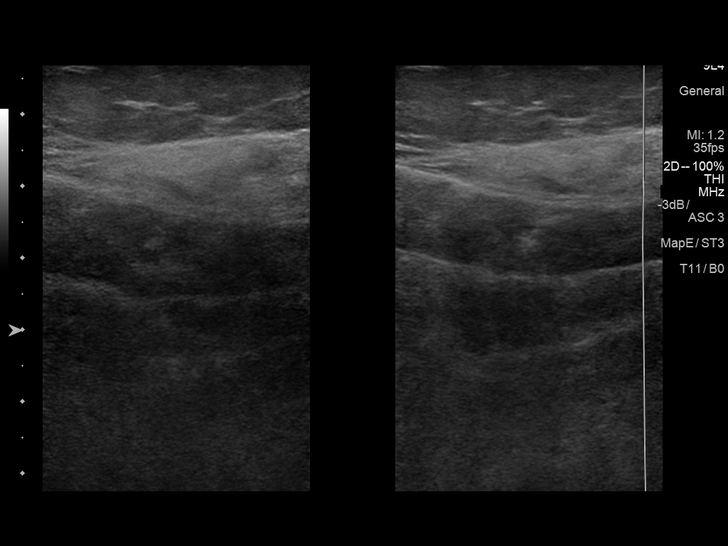
[im 15/57]
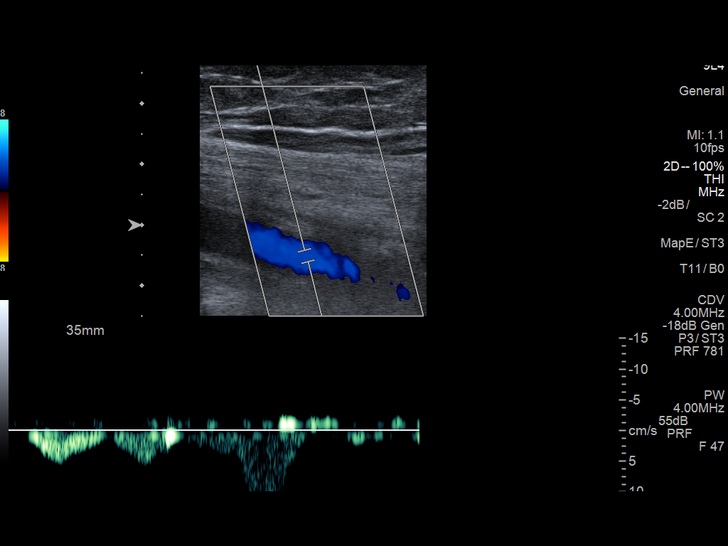
[im 18/57]
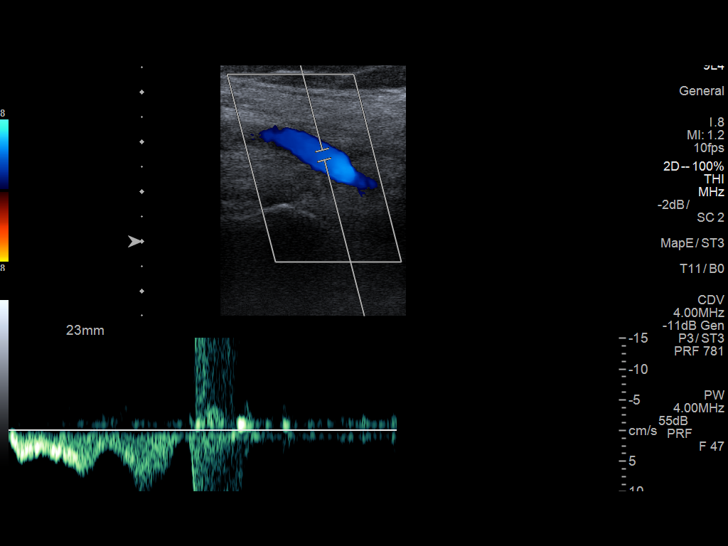
[im 22/57]
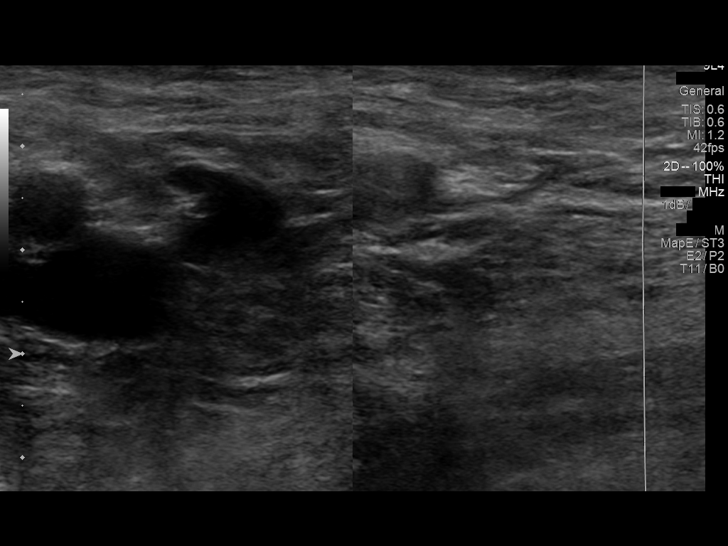
[im 27/57]
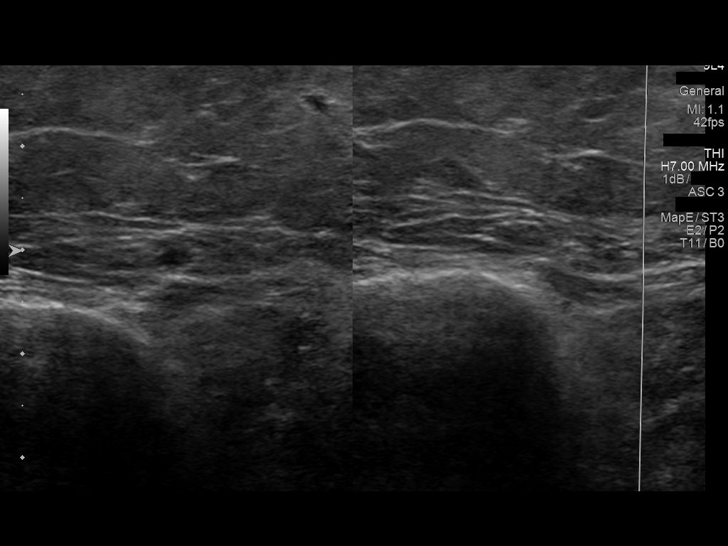
[im 30/57]
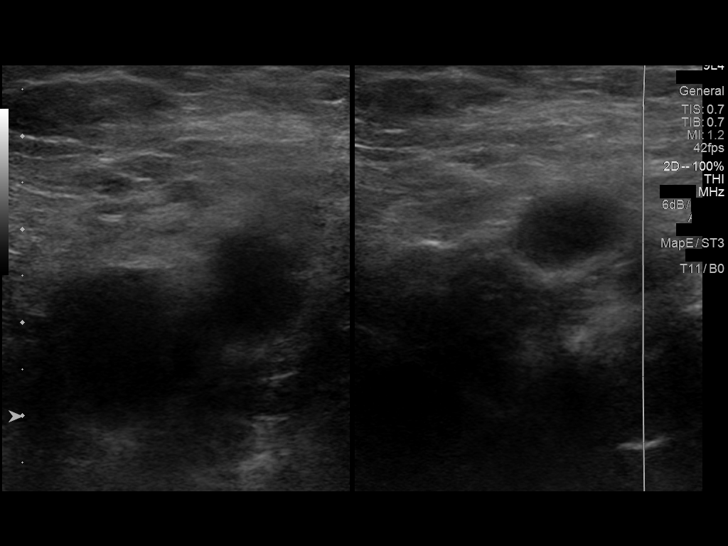
[im 35/57]
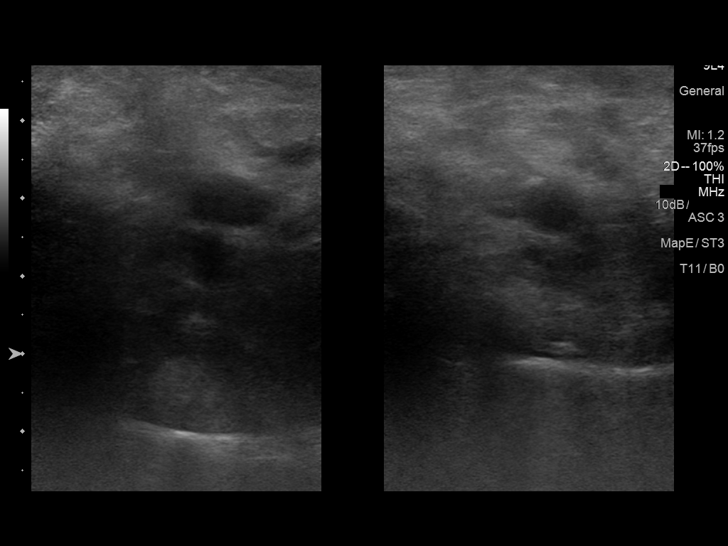
[im 39/57]
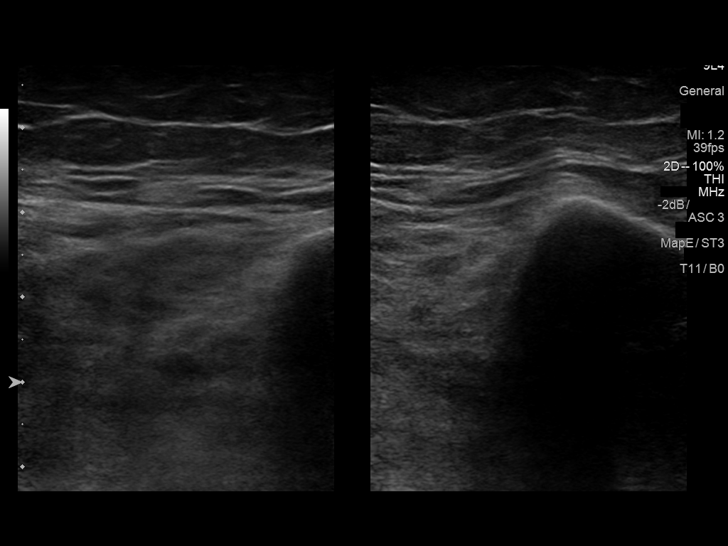
[im 44/57]
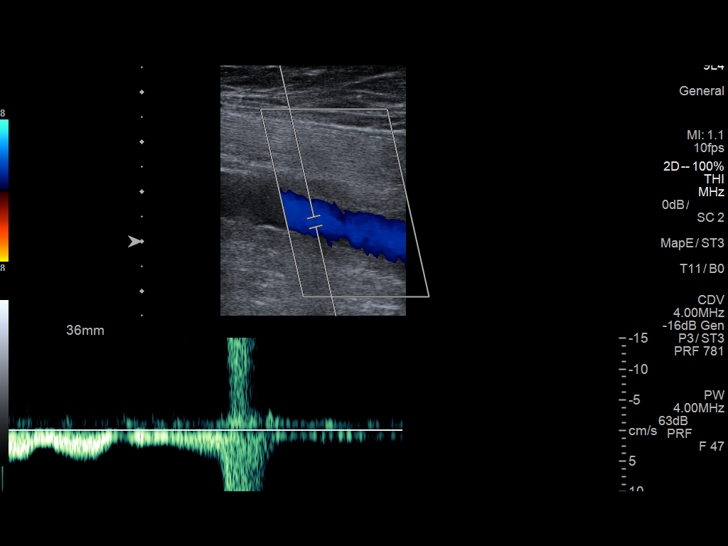
[im 47/57]
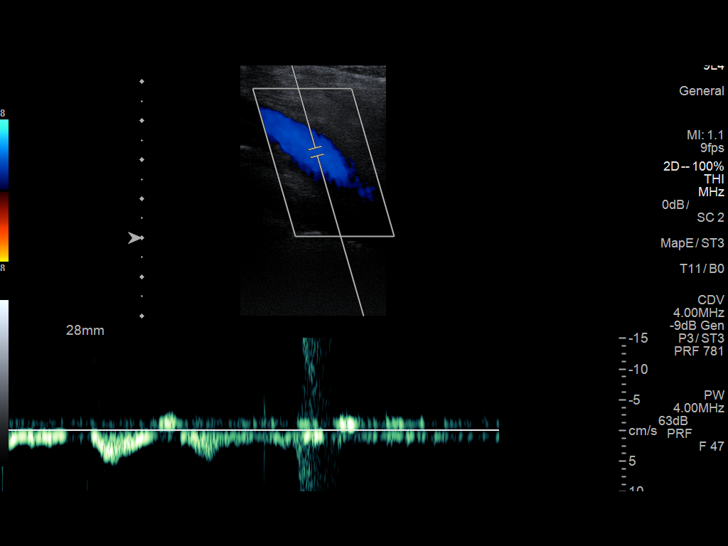
[im 52/57]
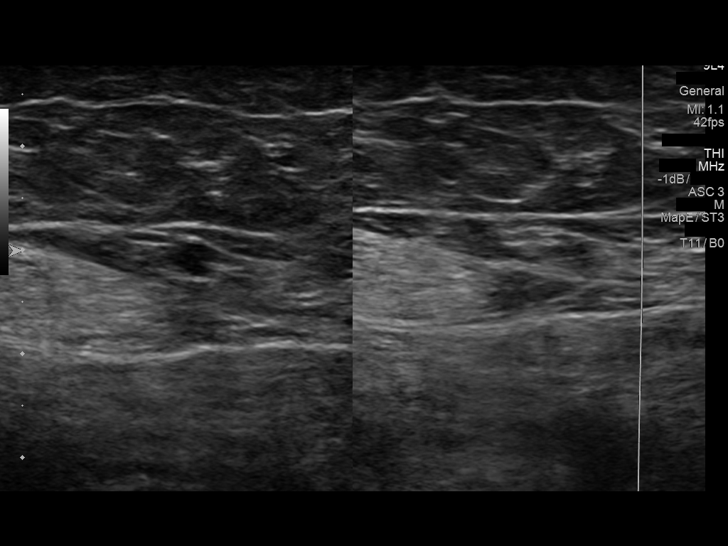
[im 57/57]
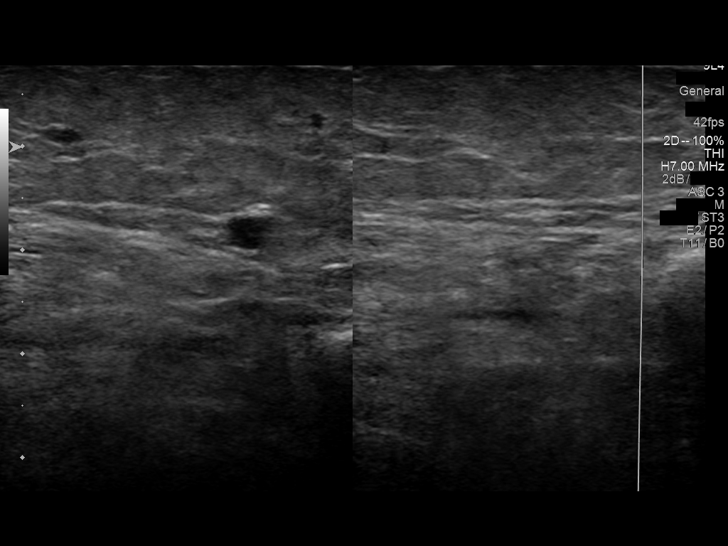

[14 of 24 positions shown; findings below may reference images not displayed]

FINDINGS: Normal compressibility of the common femoral, superficial femoral,
and popliteal veins, as well as the proximal calf veins. No filling
defects to suggest DVT on grayscale or color Doppler imaging.
Doppler waveforms show normal direction of venous flow, normal
respiratory phasicity and response to augmentation. Visualized
segments of the saphenous venous systemS normal in caliber and
compressibility.
IMPRESSION: No evidence of  lower extremity deep vein thrombosis, bilaterally.

## 2020-07-12 ENCOUNTER — Other Ambulatory Visit: Payer: Self-pay | Admitting: Family Medicine

## 2020-07-12 DIAGNOSIS — Z720 Tobacco use: Secondary | ICD-10-CM

## 2020-07-12 DIAGNOSIS — R5381 Other malaise: Secondary | ICD-10-CM

## 2020-07-20 ENCOUNTER — Other Ambulatory Visit: Payer: Self-pay | Admitting: Family Medicine

## 2020-07-20 DIAGNOSIS — E2839 Other primary ovarian failure: Secondary | ICD-10-CM

## 2020-10-05 DIAGNOSIS — E119 Type 2 diabetes mellitus without complications: Secondary | ICD-10-CM | POA: Diagnosis not present

## 2020-10-05 DIAGNOSIS — E1122 Type 2 diabetes mellitus with diabetic chronic kidney disease: Secondary | ICD-10-CM | POA: Diagnosis not present

## 2020-10-05 DIAGNOSIS — G8929 Other chronic pain: Secondary | ICD-10-CM | POA: Diagnosis not present

## 2021-01-04 DIAGNOSIS — G8929 Other chronic pain: Secondary | ICD-10-CM | POA: Diagnosis not present

## 2021-04-04 DIAGNOSIS — E119 Type 2 diabetes mellitus without complications: Secondary | ICD-10-CM | POA: Diagnosis not present

## 2021-04-04 DIAGNOSIS — G8929 Other chronic pain: Secondary | ICD-10-CM | POA: Diagnosis not present

## 2021-06-08 DIAGNOSIS — M5416 Radiculopathy, lumbar region: Secondary | ICD-10-CM | POA: Diagnosis not present

## 2021-06-22 DIAGNOSIS — M5416 Radiculopathy, lumbar region: Secondary | ICD-10-CM | POA: Diagnosis not present

## 2021-06-22 DIAGNOSIS — M47816 Spondylosis without myelopathy or radiculopathy, lumbar region: Secondary | ICD-10-CM | POA: Diagnosis not present

## 2021-06-27 DIAGNOSIS — R030 Elevated blood-pressure reading, without diagnosis of hypertension: Secondary | ICD-10-CM | POA: Diagnosis not present

## 2021-06-27 DIAGNOSIS — M5416 Radiculopathy, lumbar region: Secondary | ICD-10-CM | POA: Diagnosis not present

## 2021-07-04 DIAGNOSIS — Z23 Encounter for immunization: Secondary | ICD-10-CM | POA: Diagnosis not present

## 2021-07-04 DIAGNOSIS — G8929 Other chronic pain: Secondary | ICD-10-CM | POA: Diagnosis not present

## 2021-08-03 DIAGNOSIS — M545 Low back pain, unspecified: Secondary | ICD-10-CM | POA: Diagnosis not present

## 2021-08-03 DIAGNOSIS — M5416 Radiculopathy, lumbar region: Secondary | ICD-10-CM | POA: Diagnosis not present

## 2021-08-03 DIAGNOSIS — M533 Sacrococcygeal disorders, not elsewhere classified: Secondary | ICD-10-CM | POA: Diagnosis not present

## 2021-08-08 DIAGNOSIS — M1990 Unspecified osteoarthritis, unspecified site: Secondary | ICD-10-CM | POA: Diagnosis not present

## 2021-08-08 DIAGNOSIS — Z20822 Contact with and (suspected) exposure to covid-19: Secondary | ICD-10-CM | POA: Diagnosis not present

## 2021-08-08 DIAGNOSIS — I4891 Unspecified atrial fibrillation: Secondary | ICD-10-CM | POA: Diagnosis not present

## 2021-08-08 DIAGNOSIS — J439 Emphysema, unspecified: Secondary | ICD-10-CM | POA: Diagnosis not present

## 2021-08-08 DIAGNOSIS — I69959 Hemiplegia and hemiparesis following unspecified cerebrovascular disease affecting unspecified side: Secondary | ICD-10-CM | POA: Diagnosis not present

## 2021-08-08 DIAGNOSIS — N390 Urinary tract infection, site not specified: Secondary | ICD-10-CM | POA: Diagnosis not present

## 2021-08-08 DIAGNOSIS — F17210 Nicotine dependence, cigarettes, uncomplicated: Secondary | ICD-10-CM | POA: Diagnosis not present

## 2021-08-08 DIAGNOSIS — R262 Difficulty in walking, not elsewhere classified: Secondary | ICD-10-CM | POA: Diagnosis not present

## 2021-08-08 DIAGNOSIS — Z9181 History of falling: Secondary | ICD-10-CM | POA: Diagnosis not present

## 2021-08-08 DIAGNOSIS — R471 Dysarthria and anarthria: Secondary | ICD-10-CM | POA: Diagnosis not present

## 2021-08-08 DIAGNOSIS — I071 Rheumatic tricuspid insufficiency: Secondary | ICD-10-CM | POA: Diagnosis not present

## 2021-08-08 DIAGNOSIS — Z66 Do not resuscitate: Secondary | ICD-10-CM | POA: Diagnosis not present

## 2021-08-08 DIAGNOSIS — I6501 Occlusion and stenosis of right vertebral artery: Secondary | ICD-10-CM | POA: Diagnosis not present

## 2021-08-08 DIAGNOSIS — Z7902 Long term (current) use of antithrombotics/antiplatelets: Secondary | ICD-10-CM | POA: Diagnosis not present

## 2021-08-08 DIAGNOSIS — R29818 Other symptoms and signs involving the nervous system: Secondary | ICD-10-CM | POA: Diagnosis not present

## 2021-08-08 DIAGNOSIS — Z7982 Long term (current) use of aspirin: Secondary | ICD-10-CM | POA: Diagnosis not present

## 2021-08-08 DIAGNOSIS — R1310 Dysphagia, unspecified: Secondary | ICD-10-CM | POA: Diagnosis not present

## 2021-08-08 DIAGNOSIS — I639 Cerebral infarction, unspecified: Secondary | ICD-10-CM | POA: Diagnosis not present

## 2021-08-08 DIAGNOSIS — I6523 Occlusion and stenosis of bilateral carotid arteries: Secondary | ICD-10-CM | POA: Diagnosis not present

## 2021-08-08 DIAGNOSIS — E119 Type 2 diabetes mellitus without complications: Secondary | ICD-10-CM | POA: Diagnosis not present

## 2021-08-08 DIAGNOSIS — G8194 Hemiplegia, unspecified affecting left nondominant side: Secondary | ICD-10-CM | POA: Diagnosis not present

## 2021-08-09 DIAGNOSIS — R471 Dysarthria and anarthria: Secondary | ICD-10-CM | POA: Diagnosis not present

## 2021-08-09 DIAGNOSIS — Z9181 History of falling: Secondary | ICD-10-CM | POA: Diagnosis not present

## 2021-08-09 DIAGNOSIS — Z7902 Long term (current) use of antithrombotics/antiplatelets: Secondary | ICD-10-CM | POA: Diagnosis not present

## 2021-08-09 DIAGNOSIS — I639 Cerebral infarction, unspecified: Secondary | ICD-10-CM | POA: Diagnosis not present

## 2021-08-09 DIAGNOSIS — N390 Urinary tract infection, site not specified: Secondary | ICD-10-CM | POA: Diagnosis not present

## 2021-08-09 DIAGNOSIS — R1310 Dysphagia, unspecified: Secondary | ICD-10-CM | POA: Diagnosis not present

## 2021-08-09 DIAGNOSIS — I69959 Hemiplegia and hemiparesis following unspecified cerebrovascular disease affecting unspecified side: Secondary | ICD-10-CM | POA: Diagnosis not present

## 2021-08-10 DIAGNOSIS — I639 Cerebral infarction, unspecified: Secondary | ICD-10-CM | POA: Diagnosis not present

## 2021-10-11 ENCOUNTER — Other Ambulatory Visit: Payer: Self-pay | Admitting: Family Medicine

## 2021-10-11 DIAGNOSIS — R413 Other amnesia: Secondary | ICD-10-CM

## 2021-10-11 DIAGNOSIS — R2242 Localized swelling, mass and lump, left lower limb: Secondary | ICD-10-CM

## 2021-12-14 ENCOUNTER — Ambulatory Visit
Admission: RE | Admit: 2021-12-14 | Discharge: 2021-12-14 | Disposition: A | Discharge status: Home / Self Care | Payer: Medicare HMO | Source: Ambulatory Visit | Attending: Family Medicine | Admitting: Family Medicine

## 2021-12-14 MED ORDER — GADOBENATE DIMEGLUMINE 529 MG/ML IV SOLN
19.0000 mL | Freq: Once | INTRAVENOUS | Status: AC | PRN
  Administered 2021-12-14: 19 mL via INTRAVENOUS

## 2024-08-01 DEATH — deceased
